# Patient Record
Sex: Male | Born: 1940
Health system: Southern US, Community
[De-identification: ages and names within clinical notes are randomized; demographics above are authoritative.]

## PROBLEM LIST (undated history)

## (undated) DIAGNOSIS — I509 Heart failure, unspecified: Secondary | ICD-10-CM

## (undated) DIAGNOSIS — N400 Enlarged prostate without lower urinary tract symptoms: Secondary | ICD-10-CM

## (undated) DIAGNOSIS — G459 Transient cerebral ischemic attack, unspecified: Secondary | ICD-10-CM

## (undated) DIAGNOSIS — E785 Hyperlipidemia, unspecified: Secondary | ICD-10-CM

## (undated) DIAGNOSIS — I471 Supraventricular tachycardia: Secondary | ICD-10-CM

## (undated) DIAGNOSIS — N183 Chronic kidney disease, stage 3 unspecified: Secondary | ICD-10-CM

## (undated) DIAGNOSIS — I4719 Other supraventricular tachycardia: Secondary | ICD-10-CM

## (undated) DIAGNOSIS — I422 Other hypertrophic cardiomyopathy: Secondary | ICD-10-CM

## (undated) DIAGNOSIS — I4892 Unspecified atrial flutter: Secondary | ICD-10-CM

## (undated) DIAGNOSIS — C44319 Basal cell carcinoma of skin of other parts of face: Secondary | ICD-10-CM

## (undated) DIAGNOSIS — K635 Polyp of colon: Secondary | ICD-10-CM

## (undated) DIAGNOSIS — I2510 Atherosclerotic heart disease of native coronary artery without angina pectoris: Secondary | ICD-10-CM

## (undated) DIAGNOSIS — I10 Essential (primary) hypertension: Secondary | ICD-10-CM

## (undated) DIAGNOSIS — K5790 Diverticulosis of intestine, part unspecified, without perforation or abscess without bleeding: Secondary | ICD-10-CM

## (undated) HISTORY — PX: HERNIA REPAIR: SHX51

## (undated) HISTORY — DX: Chronic kidney disease, stage 3 (moderate): N18.3

## (undated) HISTORY — DX: Diverticulosis of intestine, part unspecified, without perforation or abscess without bleeding: K57.90

## (undated) HISTORY — DX: Chronic kidney disease, stage 3 unspecified: N18.30

## (undated) HISTORY — DX: Atherosclerotic heart disease of native coronary artery without angina pectoris: I25.10

## (undated) HISTORY — DX: Other hypertrophic cardiomyopathy: I42.2

## (undated) HISTORY — DX: Hyperlipidemia, unspecified: E78.5

## (undated) HISTORY — PX: JOINT REPLACEMENT: SHX530

## (undated) HISTORY — DX: Essential (primary) hypertension: I10

## (undated) HISTORY — DX: Polyp of colon: K63.5

## (undated) HISTORY — PX: BASAL CELL CARCINOMA EXCISION: SHX1214

## (undated) HISTORY — DX: Benign prostatic hyperplasia without lower urinary tract symptoms: N40.0

## (undated) HISTORY — DX: Heart failure, unspecified: I50.9

## (undated) HISTORY — PX: TOTAL HIP ARTHROPLASTY: SHX124

## (undated) HISTORY — DX: Transient cerebral ischemic attack, unspecified: G45.9

## (undated) HISTORY — DX: Unspecified atrial flutter: I48.92

---
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
## 2000-10-07 ENCOUNTER — Ambulatory Visit (HOSPITAL_COMMUNITY): Admission: RE | Admit: 2000-10-07 | Discharge: 2000-10-07 | Payer: Self-pay | Admitting: Gastroenterology

## 2000-10-07 ENCOUNTER — Encounter (INDEPENDENT_AMBULATORY_CARE_PROVIDER_SITE_OTHER): Payer: Self-pay | Admitting: Specialist

## 2004-10-18 ENCOUNTER — Ambulatory Visit: Payer: Self-pay | Admitting: Cardiology

## 2004-11-05 ENCOUNTER — Ambulatory Visit: Payer: Self-pay | Admitting: Cardiology

## 2004-11-05 ENCOUNTER — Ambulatory Visit: Payer: Self-pay

## 2005-09-24 ENCOUNTER — Ambulatory Visit: Payer: Self-pay | Admitting: Internal Medicine

## 2005-10-02 ENCOUNTER — Inpatient Hospital Stay (HOSPITAL_COMMUNITY): Admission: RE | Admit: 2005-10-02 | Discharge: 2005-10-05 | Payer: Self-pay | Admitting: Orthopaedic Surgery

## 2007-01-22 ENCOUNTER — Ambulatory Visit: Payer: Self-pay | Admitting: Cardiology

## 2007-01-27 ENCOUNTER — Ambulatory Visit: Payer: Self-pay | Admitting: Cardiology

## 2007-01-27 ENCOUNTER — Ambulatory Visit: Payer: Self-pay

## 2007-01-27 ENCOUNTER — Encounter: Payer: Self-pay | Admitting: Cardiology

## 2007-01-27 LAB — CONVERTED CEMR LAB
ALT: 21 units/L (ref 0–53)
AST: 25 units/L (ref 0–37)
Albumin: 3.3 g/dL — ABNORMAL LOW (ref 3.5–5.2)
Alkaline Phosphatase: 22 units/L — ABNORMAL LOW (ref 39–117)
Bilirubin, Direct: 0.1 mg/dL (ref 0.0–0.3)
Cholesterol: 125 mg/dL (ref 0–200)
HDL: 50 mg/dL (ref >39.0)
LDL Cholesterol: 63 mg/dL (ref 0–99)
Total Bilirubin: 0.7 mg/dL (ref 0.3–1.2)
Total CHOL/HDL Ratio: 2.5
Total Protein: 5.8 g/dL — ABNORMAL LOW (ref 6.0–8.3)
Triglycerides: 58 mg/dL (ref 0–149)
VLDL: 12 mg/dL (ref 0–40)

## 2007-06-01 ENCOUNTER — Inpatient Hospital Stay (HOSPITAL_COMMUNITY): Admission: RE | Admit: 2007-06-01 | Discharge: 2007-06-04 | Payer: Self-pay | Admitting: Orthopaedic Surgery

## 2008-03-29 ENCOUNTER — Ambulatory Visit: Payer: Self-pay | Admitting: Cardiology

## 2008-09-19 ENCOUNTER — Encounter: Payer: Self-pay | Admitting: Cardiology

## 2008-11-27 ENCOUNTER — Telehealth: Payer: Self-pay | Admitting: Cardiology

## 2009-01-15 ENCOUNTER — Observation Stay (HOSPITAL_COMMUNITY): Admission: EM | Admit: 2009-01-15 | Discharge: 2009-01-16 | Payer: Self-pay | Admitting: Emergency Medicine

## 2009-01-16 ENCOUNTER — Ambulatory Visit: Payer: Self-pay | Admitting: Vascular Surgery

## 2009-01-16 ENCOUNTER — Encounter (INDEPENDENT_AMBULATORY_CARE_PROVIDER_SITE_OTHER): Payer: Self-pay | Admitting: Internal Medicine

## 2009-01-17 ENCOUNTER — Telehealth: Payer: Self-pay | Admitting: Cardiology

## 2009-03-01 DIAGNOSIS — E782 Mixed hyperlipidemia: Secondary | ICD-10-CM | POA: Insufficient documentation

## 2009-03-01 DIAGNOSIS — E785 Hyperlipidemia, unspecified: Secondary | ICD-10-CM | POA: Insufficient documentation

## 2009-03-05 ENCOUNTER — Ambulatory Visit: Payer: Self-pay | Admitting: Cardiology

## 2009-03-05 DIAGNOSIS — I10 Essential (primary) hypertension: Secondary | ICD-10-CM | POA: Insufficient documentation

## 2009-03-19 ENCOUNTER — Ambulatory Visit: Payer: Self-pay | Admitting: Cardiology

## 2009-03-19 LAB — CONVERTED CEMR LAB
BUN: 14 mg/dL (ref 6–23)
CO2: 28 meq/L (ref 19–32)
Calcium: 9.4 mg/dL (ref 8.4–10.5)
Chloride: 107 meq/L (ref 96–112)
Creatinine, Ser: 1 mg/dL (ref 0.4–1.5)
GFR calc non Af Amer: 78.76 mL/min (ref >60)
Glucose, Bld: 95 mg/dL (ref 70–99)
Potassium: 4.3 meq/L (ref 3.5–5.1)
Sodium: 141 meq/L (ref 135–145)

## 2009-03-21 ENCOUNTER — Encounter: Payer: Self-pay | Admitting: Cardiology

## 2009-03-26 ENCOUNTER — Encounter: Payer: Self-pay | Admitting: Cardiology

## 2009-04-06 ENCOUNTER — Ambulatory Visit: Payer: Self-pay | Admitting: Cardiology

## 2009-04-30 ENCOUNTER — Inpatient Hospital Stay (HOSPITAL_COMMUNITY): Admission: EM | Admit: 2009-04-30 | Discharge: 2009-05-01 | Payer: Self-pay | Admitting: Emergency Medicine

## 2009-05-03 ENCOUNTER — Telehealth: Payer: Self-pay | Admitting: Cardiology

## 2009-05-09 ENCOUNTER — Ambulatory Visit: Payer: Self-pay | Admitting: Cardiology

## 2009-05-09 ENCOUNTER — Ambulatory Visit: Payer: Self-pay

## 2009-05-18 ENCOUNTER — Encounter: Payer: Self-pay | Admitting: Cardiology

## 2009-07-16 ENCOUNTER — Ambulatory Visit: Payer: Self-pay | Admitting: Cardiology

## 2009-07-16 ENCOUNTER — Encounter: Payer: Self-pay | Admitting: Cardiology

## 2009-07-16 ENCOUNTER — Ambulatory Visit (HOSPITAL_COMMUNITY): Admission: RE | Admit: 2009-07-16 | Discharge: 2009-07-16 | Payer: Self-pay | Admitting: Cardiology

## 2009-07-16 ENCOUNTER — Ambulatory Visit: Payer: Self-pay

## 2009-07-17 ENCOUNTER — Telehealth: Payer: Self-pay | Admitting: Cardiology

## 2010-04-16 NOTE — Medication Information (Signed)
Summary: Drug Quantity Limitation Request Form  Drug Quantity Limitation Request Form   Imported By: Roderic Ovens 04/17/2009 13:03:34  _____________________________________________________________________  External Attachment:    Type:   Image     Comment:   External Document

## 2010-04-16 NOTE — Progress Notes (Signed)
Summary: returning call   Phone Note Call from Patient Call back at [phone]   Caller: Patient Reason for Call: Talk to Nurse Summary of Call: returning call Initial call taken by: Migdalia Dk,  Jul 17, 2009 10:59 AM  Follow-up for Phone Call        Phone Call Completed PT AWARE OF ECHO RESULTS. Follow-up by: Scherrie Bateman, LPN,  Jul 18, 5954 11:07 AM

## 2010-04-16 NOTE — Assessment & Plan Note (Signed)
Summary: PER CHECK OUT/SF   Visit Type:  4 mo f/u  CC:  no cardiac complaints today..pt lost 6 lb since 03/2009..pt states "his winter coat coming off".  Current Medications (verified): 1)  Vytorin 10-40 Mg Tabs (Ezetimibe-Simvastatin) .Marland Kitchen.. 1 Tab Once Daily 2)  Avodart 0.5 Mg Caps (Dutasteride) .Marland Kitchen.. 1 Cap Once Daily 3)  Lisinopril 10 Mg Tabs (Lisinopril) .Marland Kitchen.. 1 Tab Once Daily 4)  Aspirin 81 Mg Tbec (Aspirin) .... Take One Tablet By Mouth Daily 5)  Tylenol Pm Extra Strength 500-25 Mg Tabs (Diphenhydramine-Apap (Sleep)) .Marland Kitchen.. 1 Tab At Bedtime  Allergies: 1)  ! Pcn  Vital Signs:  Patient profile:   70 year old male Height:      70 inches Weight:      177 pounds BMI:     25.49 Pulse rate:   66 / minute Pulse rhythm:   regular BP sitting:   122 / 80  (right arm) Cuff size:   large  Vitals Entered By: Danielle Rankin, CMA (Jul 16, 2009 10:43 AM)   Impression & Recommendations:  Problem # 1:  HYPERTROPHIC CARDIOMYOPATHY/ NONOBSTRUCTIVE (ICD-425.1)  The following medications were removed from the medication list:    Plavix 75 Mg Tabs (Clopidogrel bisulfate) .Marland Kitchen... 1 tab once daily His updated medication list for this problem includes:    Lisinopril 10 Mg Tabs (Lisinopril) .Marland Kitchen... 1 tab once daily    Aspirin 81 Mg Tbec (Aspirin) .Marland Kitchen... Take one tablet by mouth daily  The following medications were removed from the medication list:    Plavix 75 Mg Tabs (Clopidogrel bisulfate) .Marland Kitchen... 1 tab once daily His updated medication list for this problem includes:    Lisinopril 20 Mg Tabs (Lisinopril) .Marland Kitchen... 1 once daily    Aspirin 81 Mg Tbec (Aspirin) .Marland Kitchen... Take one tablet by mouth daily  Problem # 2:  HYPERTENSION, UNSPECIFIED (ICD-401.9)  His updated medication list for this problem includes:    Lisinopril 10 Mg Tabs (Lisinopril) .Marland Kitchen... 1 tab once daily    Aspirin 81 Mg Tbec (Aspirin) .Marland Kitchen... Take one tablet by mouth daily  His updated medication list for this problem includes:    Lisinopril 20  Mg Tabs (Lisinopril) .Marland Kitchen... 1 once daily    Aspirin 81 Mg Tbec (Aspirin) .Marland Kitchen... Take one tablet by mouth daily  Problem # 3:  CVA/ RIGHT MCA TERRITORY (ICD-434.91) Assessment: Unchanged  The following medications were removed from the medication list:    Plavix 75 Mg Tabs (Clopidogrel bisulfate) .Marland Kitchen... 1 tab once daily His updated medication list for this problem includes:    Aspirin 81 Mg Tbec (Aspirin) .Marland Kitchen... Take one tablet by mouth daily  The following medications were removed from the medication list:    Plavix 75 Mg Tabs (Clopidogrel bisulfate) .Marland Kitchen... 1 tab once daily His updated medication list for this problem includes:    Aspirin 81 Mg Tbec (Aspirin) .Marland Kitchen... Take one tablet by mouth daily  Problem # 4:  DYSLIPIDEMIA (ICD-272.4) Assessment: Unchanged  His updated medication list for this problem includes:    Vytorin 10-40 Mg Tabs (Ezetimibe-simvastatin) .Marland Kitchen... 1 tab once daily  His updated medication list for this problem includes:    Vytorin 10-40 Mg Tabs (Ezetimibe-simvastatin) .Marland Kitchen... 1 tab once daily  Patient Instructions: 1)  Your physician recommends that you schedule a follow-up appointment in: YEAR WITH DR Jamiel Goncalves 2)  Your physician recommends that you continue on your current medications as directed. Please refer to the Current Medication list given to you today. Prescriptions: LISINOPRIL  10 MG TABS (LISINOPRIL) 1 tab once daily  #90 x 3   Entered by:   Danielle Rankin, CMA   Authorized by:   Gaylord Shih, MD, [facility]   Signed by:   Danielle Rankin, CMA on 07/16/2009   Method used:   Electronically to        [facility]* (mail-order)             ,          Ph: [phone]       Fax: [phone]   RxID:   [id number]

## 2010-04-16 NOTE — Letter (Signed)
Summary: Control and instrumentation engineer Authorization   Imported By: Roderic Ovens 06/13/2009 13:09:57  _____________________________________________________________________  External Attachment:    Type:   Image     Comment:   External Document

## 2010-04-16 NOTE — Miscellaneous (Signed)
  Clinical Lists Changes  Medications: Rx of LISINOPRIL 20 MG TABS (LISINOPRIL) 1 once daily;  #90 x 3;  Signed;  Entered by: Scherrie Bateman, LPN;  Authorized by: Gaylord Shih, MD, [facility];  Method used: Faxed to [facility]*, , ,   , Ph: [phone], Fax: [phone]    Prescriptions: LISINOPRIL 20 MG TABS (LISINOPRIL) 1 once daily  #90 x 3   Entered by:   Scherrie Bateman, LPN   Authorized by:   Gaylord Shih, MD, [facility]   Signed by:   Scherrie Bateman, LPN on 29/56/2130   Method used:   Faxed to ...       [facility]* (mail-order)             ,          Ph: [phone]       Fax: [phone]   RxID:   [id number]

## 2010-04-16 NOTE — Progress Notes (Signed)
Summary: rx vytorin 10/40 [facility]  Medications Added VYTORIN 10-40 MG TABS (EZETIMIBE-SIMVASTATIN) 1 tab once daily AVODART 0.5 MG CAPS (DUTASTERIDE) 1 cap once daily ASPIRIN 81 MG TBEC (ASPIRIN) Take one tablet by mouth daily GLUCOSAMINE 1500 COMPLEX  CAPS (GLUCOSAMINE-CHONDROIT-VIT C-MN) 1 cap once daily       Phone Note Refill Request Call back at Home Phone [phone] Message from:  Patient on November 27, 2008 1:34 PM  Refills Requested: Medication #1:  VYTORIN 10-40 MG TABS 1 tab once daily.   Supply Requested: 3 months [facility] [id number], call pt once it has been done   Method Requested: Fax to Mail Away Pharmacy Initial call taken by: Migdalia Dk,  November 27, 2008 1:35 PM    New/Updated Medications: VYTORIN 10-40 MG TABS (EZETIMIBE-SIMVASTATIN) 1 tab once daily AVODART 0.5 MG CAPS (DUTASTERIDE) 1 cap once daily ASPIRIN 81 MG TBEC (ASPIRIN) Take one tablet by mouth daily GLUCOSAMINE 1500 COMPLEX  CAPS (GLUCOSAMINE-CHONDROIT-VIT C-MN) 1 cap once daily Prescriptions: VYTORIN 10-40 MG TABS (EZETIMIBE-SIMVASTATIN) 1 tab once daily  #90 x 3   Entered by:   Danielle Rankin, CMA   Authorized by:   Gaylord Shih, MD, [facility]   Signed by:   Danielle Rankin, CMA on 11/27/2008   Method used:   Electronically to        [facility]* (mail-order)             ,          Ph: [phone]       Fax: [phone]   RxID:   [id number]

## 2010-04-16 NOTE — Assessment & Plan Note (Signed)
Summary: bp check and labs  Nurse Visit   Vital Signs:  Patient profile:   70 year old male Pulse (ortho):   60 / minute Resp:     18 per minute BP supine:   110 / 70  (right arm)  Allergies: 1)  ! Pcn

## 2010-04-16 NOTE — Progress Notes (Signed)
Summary: b/p issue on monday- pt on linisopril   Phone Note Call from Patient Call back at [facility] Phone [phone] Call back at [phone]   Caller: Patient Reason for Call: Talk to Nurse Details for Reason: Per pt calling regarding to dosage on linisporil. does pt have proper dosage with low blood pressure incident on monday while he was on the meds. inform pt that i would send a message to his nurse.  Initial call taken by: Lorne Skeens,  May 03, 2009 9:49 AM  Follow-up for Phone Call        [facility] Scherrie Bateman, LPN  May 03, 2009 10:31 AM  Additional Follow-up for Phone Call Additional follow up Details #1::        FYI PT WAS HOSPITALIZED ON MON AND KEPT OVER NIGHT AFTER BLACKING OUT  FOLLOWING A 17  MILE BIKE RIDE.PER PT WAS GIVEN APPROX 5 L OF FLUID  LISINOPRIL WAS HELD X 2 DAYS ON THIRD DAY PT ONLY TOOK 1/2 TAB LISINOPRIL 20 MG B/P NEXT AM WAS 120/68 B/P  WHILE MED WAS HELD RAN 110/65-70 PER PT. PLEASE ADVISE. Additional Follow-up by: Scherrie Bateman, LPN,  May 03, 2009 10:58 AM    Additional Follow-up for Phone Call Additional follow up Details #2::    He neds to stay on lisinopril. He must hydrate well before exercising. Need to arrange standard treadmill with me next week. Just double book. Have him take meds prior to treadmill. Follow-up by: Gaylord Shih, MD, [facility],  May 03, 2009 2:11 PM   Appended Document: b/p issue on monday- pt on linisopril PT AWARE./CY  Appended Document: b/p issue on monday- pt on linisopril PT AWARE./CY

## 2010-04-16 NOTE — Miscellaneous (Signed)
  Clinical Lists Changes  Medications: Removed medication of HYDROCHLOROTHIAZIDE 12.5 MG CAPS (HYDROCHLOROTHIAZIDE) 1 cap once daily Added new medication of LISINOPRIL 20 MG TABS (LISINOPRIL) 1 once daily - Signed Rx of LISINOPRIL 20 MG TABS (LISINOPRIL) 1 once daily;  #30 x 3;  Signed;  Entered by: Scherrie Bateman, LPN;  Authorized by: Gaylord Shih, MD, [facility];  Method used: [facility] to [id number] [address] [place], [place], [place]  [address], Ph: [phone], Fax: [phone]    Prescriptions: LISINOPRIL 20 MG TABS (LISINOPRIL) 1 once daily  #30 x 3   Entered by:   Scherrie Bateman, LPN   Authorized by:   Gaylord Shih, MD, [facility]   Signed by:   Scherrie Bateman, LPN on 65/78/4696   Method used:   [facility]        [id number] ([facility]ail)       [address]       [place], [place]  [address]       Ph: [phone]       Fax: [phone]   RxID:   [id number]   Appended Document:   Reviewed Juanito Doom, MD

## 2010-04-16 NOTE — Assessment & Plan Note (Signed)
Summary: 4wk f/u sl    Primary Provider:  Lynnea [name]   History of Present Illness: [name] returns today for evaluation of his hypertension. Since we added lisinopril, his blood pressures at that goal. He brings a log today.  He had one episode of presyncope when his pressure was 90 systolic. This is after a 20 mile bike ride, 2 episodes of diarrhea, and sitting in a hot tub.  He said for the symptoms of TIAs or mini strokes. He denies orthopnea, PND or peripheral edema. He is more fatigued. Blood work is being followed by his primary care.  Current Medications (verified): 1)  Vytorin 10-40 Mg Tabs (Ezetimibe-Simvastatin) .Marland Kitchen.. 1 Tab Once Daily 2)  Avodart 0.5 Mg Caps (Dutasteride) .Marland Kitchen.. 1 Cap Once Daily 3)  Plavix 75 Mg Tabs (Clopidogrel Bisulfate) .Marland Kitchen.. 1 Tab Once Daily 4)  Lisinopril 20 Mg Tabs (Lisinopril) .Marland Kitchen.. 1 Once Daily  Allergies: 1)  ! Pcn  Past History:  Past Medical History: Last updated: 03/01/2009 CVA/ RIGHT MCA TERRITORY (ICD-434.91) HYPERTROPHIC CARDIOMYOPATHY/ NONOBSTRUCTIVE (ICD-425.1) DYSLIPIDEMIA (ICD-272.4)  Past Surgical History: Last updated: 03/01/2009  1. Remote history of excision of basal cell carcinoma.   2. Prior right total hip arthroplasty.   3. Left total hip replacement.      Family History: Last updated: 03/01/2009 The patient denies any history of strokes, TIAs,   diabetes, hypertension or cardiovascular disease in the family.      Social History: Last updated: 03/01/2009  Former tobacco user and smoked up until the age of 61,   two-packs per day for about 10 years and quit 38 years ago.  No alcohol,  quit 16 years ago.  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   Vital Signs:  Patient profile:   70 year old male Height:      70 inches Weight:      183 pounds BMI:     26.35 Pulse rate:   64 / minute Pulse rhythm:   regular BP sitting:   108 / 74  (left arm) Cuff size:   large  Physical  Exam  General:  Well developed, well nourished, in no acute distress. Head:  normocephalic and atraumatic Eyes:  PERRLA/EOM intact; conjunctiva and lids normal. Neck:  Neck supple, no JVD. No masses, thyromegaly or abnormal cervical nodes. Chest Strother Everitt:  no deformities or breast masses noted Lungs:  Clear bilaterally to auscultation and percussion. Heart:  regular rate and rhythm, PMI nondisplaced, no gallop or murmur. Msk:  Back normal, normal gait. Muscle strength and tone normal. Pulses:  pulses normal in all 4 extremities Extremities:  No clubbing or cyanosis. Neurologic:  Alert and oriented x 3.   Impression & Recommendations:  Problem # 1:  HYPERTENSION, UNSPECIFIED (ICD-401.9) Assessment Improved I am pleased with his blood pressures. Abdomen no changes. His electrolytes are stable early January. We will not repeat. I will have him return in May for a echocardiogram and office visit. Hopefully his left ventricular function will improve further. I discussed at length exercise parameters. His updated medication list for this problem includes:    Lisinopril 20 Mg Tabs (Lisinopril) .Marland Kitchen... 1 once daily  Other Orders: Echocardiogram (Echo)  Patient Instructions: 1)  Your physician recommends that you schedule a follow-up appointment in:  MAY 2011 ECHO SAME DAY 2)  Your physician recommends that you continue on your current medications as directed. Please refer to the Current Medication list given to you today.  3)  Your physician has requested that you have an echocardiogram.  Echocardiography is a painless test that uses sound waves to create images of your heart. It provides your doctor with information about the size and shape of your heart and how well your heart's chambers and valves are working.  This procedure takes approximately one hour. There are no restrictions for this procedure.SEE DR Kensley Valladares  SAME DAY

## 2010-04-16 NOTE — Progress Notes (Signed)
Summary: hospital fu appt   Phone Note Call from Patient Call back at Home Phone [phone]   Caller: Patient Reason for Call: Talk to Nurse Summary of Call: pt is sch for hospital fu on 11/24, pt wants to make sure that is not to long of a wait Initial call taken by: Migdalia Dk,  January 17, 2009 11:07 AM  Follow-up for Phone Call        End of Nov will be fine. Follow-up by: Gaylord Shih, MD,  [facility],  January 17, 2009 2:57 PM     Appended Document: hospital fu appt lmtcb./cy  Appended Document: hospital fu appt returning call  Appended Document: hospital fu appt PT AWARE .Zack Seal

## 2010-04-16 NOTE — Miscellaneous (Signed)
Summary: rx vytorin 10/40  Clinical Lists Changes  Medications: Added new medication of VYTORIN 10-40 MG TABS (EZETIMIBE-SIMVASTATIN) 1 tab once daily - Signed Rx of VYTORIN 10-40 MG TABS (EZETIMIBE-SIMVASTATIN) 1 tab once daily;  #30 x 11;  Signed;  Entered by: Danielle Rankin, CMA;  Authorized by: Gaylord Shih, MD, [facility];  Method used: [id number] [address] [place], [place], [place]  [address], Ph: [phone], Fax: [phone]    Prescriptions: VYTORIN 10-40 MG TABS (EZETIMIBE-SIMVASTATIN) 1 tab once daily  #30 x 11   Entered by:   Danielle Rankin, CMA   Authorized by:   Gaylord Shih, MD, [facility]   Signed by:   Danielle Rankin, CMA on 09/19/2008   Method used:   [facility] to        [facility]  [address] [phone][facility] (retail)       [address]       [place], [place]  [address]       Ph: [phone]       Fax: [phone]   RxID:   [id number]

## 2010-04-16 NOTE — Assessment & Plan Note (Signed)
Summary: eph/tia/ok per Christine/jss  Medications Added PLAVIX 75 MG TABS (CLOPIDOGREL BISULFATE) 1 tab once daily HYDROCHLOROTHIAZIDE 12.5 MG CAPS (HYDROCHLOROTHIAZIDE) 1 cap once daily        Visit Type:  EPH Primary Provider:  Lynnea Ferrier  CC:  pt was in the hospital for possible TIA.Marland Kitchenpt states he has had no complaints since he was in the hospital.  History of Present Illness: [name] comes in today for post hospital followup. He was admitted with a TIA involving the left side of his face with slurring of his speech while bike riding. Hospital evaluation included an MRI which showed a punctate infarct in the right lateral postcentral gyrus suggesting a recent right middle cerebral artery territory event. He also carotid Dopplers which showed minimal plaquing. 2-D echocardiogram showed mild left ventricular dilatation with an EF of 40-45% with moderate diffuse hypokinesia. This ejection fraction is down from his last ultrasound 2 years ago. He has a history of a hypertrophic nonobstructive cardiomyopathy. Previous ejection fraction was greater than 55%.  His blood pressure remains in the high 130s with diastolics running less than 80. He doesn't think the hydrochlorothiazide that he was started on has helped.  He is now on Plavix and low-dose aspirin.  Current Medications (verified): 1)  Vytorin 10-40 Mg Tabs (Ezetimibe-Simvastatin) .Marland Kitchen.. 1 Tab Once Daily 2)  Avodart 0.5 Mg Caps (Dutasteride) .Marland Kitchen.. 1 Cap Once Daily 3)  Plavix 75 Mg Tabs (Clopidogrel Bisulfate) .Marland Kitchen.. 1 Tab Once Daily 4)  Hydrochlorothiazide 12.5 Mg Caps (Hydrochlorothiazide) .Marland Kitchen.. 1 Cap Once Daily  Allergies: 1)  ! Pcn  Past History:  Past Medical History: Last updated: 03/01/2009 CVA/ RIGHT MCA TERRITORY (ICD-434.91) HYPERTROPHIC CARDIOMYOPATHY/ NONOBSTRUCTIVE (ICD-425.1) DYSLIPIDEMIA (ICD-272.4)  Past Surgical History: Last updated: 03/01/2009  1. Remote history of excision of basal cell carcinoma.   2.  Prior right total hip arthroplasty.   3. Left total hip replacement.      Family History: Last updated: 03/01/2009 The patient denies any history of strokes, TIAs,   diabetes, hypertension or cardiovascular disease in the family.      Social History: Last updated: 03/01/2009  Former tobacco user and smoked up until the age of 25,   two-packs per day for about 10 years and quit 38 years ago.  No alcohol,  quit 16 years ago.  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   Review of Systems       negative other than the history of present illness.  Vital Signs:  Patient profile:   70 year old male Height:      70 inches Weight:      183 pounds BMI:     26.35 Pulse rate:   68 / minute Pulse rhythm:   irregular BP sitting:   146 / 90  (left arm) Cuff size:   large  Vitals Entered By: Danielle Rankin, CMA (March 05, 2009 9:41 AM)  Physical Exam  General:  Well developed, well nourished, in no acute distress. Head:  normocephalic and atraumatic Eyes:  PERRLA/EOM intact; conjunctiva and lids normal. Neck:  Neck supple, no JVD. No masses, thyromegaly or abnormal cervical nodes. Chest [name]:  no deformities or breast masses noted Lungs:  Clear bilaterally to auscultation and percussion. Heart:  soft systolic murmur left lower sternal border. Abdomen:  Bowel sounds positive; abdomen soft and non-tender without masses, organomegaly, or hernias noted. No hepatosplenomegaly. Msk:  Back normal, normal gait. Muscle strength and tone  normal. Pulses:  pulses normal in all 4 extremities Extremities:  No clubbing or cyanosis. Neurologic:  Alert and oriented x 3. Skin:  Intact without lesions or rashes. Psych:  Normal affect.   Problems:  Medical Problems Added: 1)  Dx of Hypertension, Unspecified  (ICD-401.9)  Impression & Recommendations:  Problem # 1:  HYPERTENSION, UNSPECIFIED (ICD-401.9) Assessment New  The following medications were removed from  the medication list:    Aspirin 81 Mg Tbec (Aspirin) .Marland Kitchen... Take one tablet by mouth daily His updated medication list for this problem includes:    Hydrochlorothiazide 12.5 Mg Caps (Hydrochlorothiazide) .Marland Kitchen... 1 cap once daily Ihave discontinued his HCTZ and placed him on lisinopril 20 mg p.o. q.a.m. His ejection fraction is down on his last echocardiogram to 40-45%. Most of this is due to poorly controlled and new onset hypertension. The reason I am stopping his HCTZ because it has not changed his blood pressure at all per his checks. He will return after the new year for a metabolic panel and a blood pressure check. He'll continue to monitor his pressure and keep a log. I'll see him back for a followup visit in about 4 weeks. After good blood pressure control for 6 months, we will repeat his echocardiogram. The following medications were removed from the medication list:    Aspirin 81 Mg Tbec (Aspirin) .Marland Kitchen... Take one tablet by mouth daily His updated medication list for this problem includes:    Hydrochlorothiazide 12.5 Mg Caps (Hydrochlorothiazide) .Marland Kitchen... 1 cap once daily  Problem # 2:  HYPERTROPHIC CARDIOMYOPATHY/ NONOBSTRUCTIVE (ICD-425.1) Assessment: Deteriorated  The following medications were removed from the medication list:    Aspirin 81 Mg Tbec (Aspirin) .Marland Kitchen... Take one tablet by mouth daily His updated medication list for this problem includes:    Plavix 75 Mg Tabs (Clopidogrel bisulfate) .Marland Kitchen... 1 tab once daily    Hydrochlorothiazide 12.5 Mg Caps (Hydrochlorothiazide) .Marland Kitchen... 1 cap once daily See my note above. He may ultimately need a beta blocker such as carvedilol. The following medications were removed from the medication list:    Aspirin 81 Mg Tbec (Aspirin) .Marland Kitchen... Take one tablet by mouth daily His updated medication list for this problem includes:    Plavix 75 Mg Tabs (Clopidogrel bisulfate) .Marland Kitchen... 1 tab once daily    Hydrochlorothiazide 12.5 Mg Caps (Hydrochlorothiazide) .Marland Kitchen... 1  cap once daily  Problem # 3:  DYSLIPIDEMIA (ICD-272.4) Assessment: Improved  His updated medication list for this problem includes:    Vytorin 10-40 Mg Tabs (Ezetimibe-simvastatin) .Marland Kitchen... 1 tab once daily  Problem # 4:  CVA/ RIGHT MCA TERRITORY (ICD-434.91) Assessment: Improved  The following medications were removed from the medication list:    Aspirin 81 Mg Tbec (Aspirin) .Marland Kitchen... Take one tablet by mouth daily His updated medication list for this problem includes:    Plavix 75 Mg Tabs (Clopidogrel bisulfate) .Marland Kitchen... 1 tab once daily  Other Orders: EKG w/ Interpretation (93000)  Patient Instructions: 1)  Your physician recommends that you schedule a follow-up appointment in: B/P CHECK  AFTER FIRST OF YEAR  AND 4 WEEKS WITH DR Lakecia Deschamps 2)  Your physician recommends that you return for lab work GE:XBMW SAME DAY AS B/P CHECK V58.69 3)  Your physician has recommended you make the following change in your medication: STOP HCTZ AND START LISINOPRIL 20 MG EVERY DAY

## 2010-04-16 NOTE — Letter (Signed)
Summary: [employer] Step [employer] Drug Request Form  [employer] Step [employer] Drug Request Form   Imported By: Roderic Ovens 06/13/2009 13:07:22  _____________________________________________________________________  External Attachment:    Type:   Image     Comment:   External Document

## 2010-06-05 LAB — CBC
HCT: 32.5 % — ABNORMAL LOW (ref 39.0–52.0)
HCT: 34.3 % — ABNORMAL LOW (ref 39.0–52.0)
HCT: 36.1 % — ABNORMAL LOW (ref 39.0–52.0)
Hemoglobin: 11.5 g/dL — ABNORMAL LOW (ref 13.0–17.0)
Hemoglobin: 11.8 g/dL — ABNORMAL LOW (ref 13.0–17.0)
Hemoglobin: 12.6 g/dL — ABNORMAL LOW (ref 13.0–17.0)
MCHC: 34.3 g/dL (ref 30.0–36.0)
MCHC: 34.8 g/dL (ref 30.0–36.0)
MCHC: 35.3 g/dL (ref 30.0–36.0)
MCV: 91.1 fL (ref 78.0–100.0)
MCV: 91.1 fL (ref 78.0–100.0)
MCV: 91.9 fL (ref 78.0–100.0)
Platelets: 152 10*3/uL (ref 150–400)
Platelets: 159 10*3/uL (ref 150–400)
Platelets: 163 10*3/uL (ref 150–400)
RBC: 3.58 MIL/uL — ABNORMAL LOW (ref 4.22–5.81)
RBC: 3.73 MIL/uL — ABNORMAL LOW (ref 4.22–5.81)
RBC: 3.96 MIL/uL — ABNORMAL LOW (ref 4.22–5.81)
RDW: 13.2 % (ref 11.5–15.5)
RDW: 13.2 % (ref 11.5–15.5)
RDW: 13.3 % (ref 11.5–15.5)
WBC: 11 10*3/uL — ABNORMAL HIGH (ref 4.0–10.5)
WBC: 12.4 10*3/uL — ABNORMAL HIGH (ref 4.0–10.5)
WBC: 13.4 10*3/uL — ABNORMAL HIGH (ref 4.0–10.5)

## 2010-06-05 LAB — URINALYSIS, ROUTINE W REFLEX MICROSCOPIC
Bilirubin Urine: NEGATIVE
Glucose, UA: NEGATIVE mg/dL
Hgb urine dipstick: NEGATIVE
Ketones, ur: 40 mg/dL — AB
Leukocytes, UA: NEGATIVE
Nitrite: NEGATIVE
Protein, ur: 30 mg/dL — AB
Specific Gravity, Urine: 1.02 (ref 1.005–1.030)
Urobilinogen, UA: 0.2 mg/dL (ref 0.0–1.0)
pH: 6 (ref 5.0–8.0)

## 2010-06-05 LAB — FOLATE: Folate: 10.8 ng/mL

## 2010-06-05 LAB — COMPREHENSIVE METABOLIC PANEL
ALT: 14 U/L (ref 0–53)
AST: 18 U/L (ref 0–37)
Albumin: 3.2 g/dL — ABNORMAL LOW (ref 3.5–5.2)
Alkaline Phosphatase: 20 U/L — ABNORMAL LOW (ref 39–117)
BUN: 20 mg/dL (ref 6–23)
CO2: 23 mEq/L (ref 19–32)
Calcium: 8.3 mg/dL — ABNORMAL LOW (ref 8.4–10.5)
Chloride: 111 mEq/L (ref 96–112)
Creatinine, Ser: 1.21 mg/dL (ref 0.4–1.5)
GFR calc Af Amer: >60 mL/min (ref >60)
GFR calc non Af Amer: 59 mL/min — ABNORMAL LOW (ref >60)
Glucose, Bld: 98 mg/dL (ref 70–99)
Potassium: 4 mEq/L (ref 3.5–5.1)
Sodium: 140 mEq/L (ref 135–145)
Total Bilirubin: 1.1 mg/dL (ref 0.3–1.2)
Total Protein: 5.2 g/dL — ABNORMAL LOW (ref 6.0–8.3)

## 2010-06-05 LAB — FERRITIN: Ferritin: 251 ng/mL (ref 22–322)

## 2010-06-05 LAB — DIFFERENTIAL
Basophils Absolute: 0 10*3/uL (ref 0.0–0.1)
Basophils Relative: 0 % (ref 0–1)
Eosinophils Absolute: 0 10*3/uL (ref 0.0–0.7)
Eosinophils Relative: 0 % (ref 0–5)
Lymphocytes Relative: 8 % — ABNORMAL LOW (ref 12–46)
Lymphs Abs: 1 10*3/uL (ref 0.7–4.0)
Monocytes Absolute: 0.5 10*3/uL (ref 0.1–1.0)
Monocytes Relative: 4 % (ref 3–12)
Neutro Abs: 10.9 10*3/uL — ABNORMAL HIGH (ref 1.7–7.7)
Neutrophils Relative %: 87 % — ABNORMAL HIGH (ref 43–77)

## 2010-06-05 LAB — URINE MICROSCOPIC-ADD ON

## 2010-06-05 LAB — CK TOTAL AND CKMB (NOT AT ARMC)
CK, MB: 6.3 ng/mL (ref 0.3–4.0)
Relative Index: 5.6 — ABNORMAL HIGH (ref 0.0–2.5)
Total CK: 113 U/L (ref 7–232)

## 2010-06-05 LAB — RETICULOCYTES
RBC.: 3.89 MIL/uL — ABNORMAL LOW (ref 4.22–5.81)
Retic Count, Absolute: 35 10*3/uL (ref 19.0–186.0)
Retic Ct Pct: 0.9 % (ref 0.4–3.1)

## 2010-06-05 LAB — POCT I-STAT, CHEM 8
BUN: 25 mg/dL — ABNORMAL HIGH (ref 6–23)
Calcium, Ion: 1.17 mmol/L (ref 1.12–1.32)
Chloride: 108 mEq/L (ref 96–112)
Creatinine, Ser: 1.5 mg/dL (ref 0.4–1.5)
Glucose, Bld: 96 mg/dL (ref 70–99)
HCT: 35 % — ABNORMAL LOW (ref 39.0–52.0)
Hemoglobin: 11.9 g/dL — ABNORMAL LOW (ref 13.0–17.0)
Potassium: 4.1 mEq/L (ref 3.5–5.1)
Sodium: 139 mEq/L (ref 135–145)
TCO2: 23 mmol/L (ref 0–100)

## 2010-06-05 LAB — POCT CARDIAC MARKERS
CKMB, poc: 2.2 ng/mL (ref 1.0–8.0)
Myoglobin, poc: 164 ng/mL (ref 12–200)
Troponin i, poc: <0.05 ng/mL (ref 0.00–0.09)

## 2010-06-05 LAB — IRON AND TIBC
Iron: 24 ug/dL — ABNORMAL LOW (ref 42–135)
Saturation Ratios: 9 % — ABNORMAL LOW (ref 20–55)
TIBC: 271 ug/dL (ref 215–435)
UIBC: 247 ug/dL

## 2010-06-05 LAB — CARDIAC PANEL(CRET KIN+CKTOT+MB+TROPI)
CK, MB: 5.1 ng/mL — ABNORMAL HIGH (ref 0.3–4.0)
CK, MB: 6 ng/mL — ABNORMAL HIGH (ref 0.3–4.0)
Relative Index: 4.3 — ABNORMAL HIGH (ref 0.0–2.5)
Relative Index: 5.7 — ABNORMAL HIGH (ref 0.0–2.5)
Total CK: 106 U/L (ref 7–232)
Total CK: 118 U/L (ref 7–232)
Troponin I: 0.04 ng/mL (ref 0.00–0.06)
Troponin I: 0.05 ng/mL (ref 0.00–0.06)

## 2010-06-05 LAB — VITAMIN B12: Vitamin B-12: 760 pg/mL (ref 211–911)

## 2010-06-05 LAB — TSH: TSH: 1.091 u[IU]/mL (ref 0.350–4.500)

## 2010-06-05 LAB — TROPONIN I: Troponin I: 0.06 ng/mL (ref 0.00–0.06)

## 2010-06-19 LAB — CBC
HCT: 47 % (ref 39.0–52.0)
Hemoglobin: 16 g/dL (ref 13.0–17.0)
MCHC: 34 g/dL (ref 30.0–36.0)
MCV: 91.4 fL (ref 78.0–100.0)
Platelets: 173 10*3/uL (ref 150–400)
RBC: 5.14 MIL/uL (ref 4.22–5.81)
RDW: 12.6 % (ref 11.5–15.5)
WBC: 9.6 10*3/uL (ref 4.0–10.5)

## 2010-06-19 LAB — LIPID PANEL
Cholesterol: 124 mg/dL (ref 0–200)
HDL: 57 mg/dL (ref >39)
LDL Cholesterol: 56 mg/dL (ref 0–99)
Total CHOL/HDL Ratio: 2.2 RATIO
Triglycerides: 56 mg/dL (ref <150)
VLDL: 11 mg/dL (ref 0–40)

## 2010-06-19 LAB — ETHANOL: Alcohol, Ethyl (B): <5 mg/dL (ref 0–10)

## 2010-06-19 LAB — COMPREHENSIVE METABOLIC PANEL
ALT: 26 U/L (ref 0–53)
AST: 27 U/L (ref 0–37)
Albumin: 4.6 g/dL (ref 3.5–5.2)
Alkaline Phosphatase: 27 U/L — ABNORMAL LOW (ref 39–117)
BUN: 18 mg/dL (ref 6–23)
CO2: 26 mEq/L (ref 19–32)
Calcium: 10.2 mg/dL (ref 8.4–10.5)
Chloride: 104 mEq/L (ref 96–112)
Creatinine, Ser: 1.19 mg/dL (ref 0.4–1.5)
GFR calc Af Amer: >60 mL/min (ref >60)
GFR calc non Af Amer: >60 mL/min (ref >60)
Glucose, Bld: 90 mg/dL (ref 70–99)
Potassium: 5.7 mEq/L — ABNORMAL HIGH (ref 3.5–5.1)
Sodium: 139 mEq/L (ref 135–145)
Total Bilirubin: 0.9 mg/dL (ref 0.3–1.2)
Total Protein: 7.2 g/dL (ref 6.0–8.3)

## 2010-06-19 LAB — DIFFERENTIAL
Basophils Absolute: 0 10*3/uL (ref 0.0–0.1)
Basophils Relative: 0 % (ref 0–1)
Eosinophils Absolute: 0 10*3/uL (ref 0.0–0.7)
Eosinophils Relative: 0 % (ref 0–5)
Lymphocytes Relative: 17 % (ref 12–46)
Lymphs Abs: 1.6 10*3/uL (ref 0.7–4.0)
Monocytes Absolute: 0.6 10*3/uL (ref 0.1–1.0)
Monocytes Relative: 7 % (ref 3–12)
Neutro Abs: 7.3 10*3/uL (ref 1.7–7.7)
Neutrophils Relative %: 76 % (ref 43–77)

## 2010-06-19 LAB — POCT CARDIAC MARKERS
CKMB, poc: 3.4 ng/mL (ref 1.0–8.0)
Myoglobin, poc: 122 ng/mL (ref 12–200)
Troponin i, poc: <0.05 ng/mL (ref 0.00–0.09)

## 2010-06-19 LAB — POCT I-STAT, CHEM 8
BUN: 25 mg/dL — ABNORMAL HIGH (ref 6–23)
Calcium, Ion: 1.26 mmol/L (ref 1.12–1.32)
Chloride: 105 mEq/L (ref 96–112)
Creatinine, Ser: 1.1 mg/dL (ref 0.4–1.5)
Glucose, Bld: 86 mg/dL (ref 70–99)
HCT: 49 % (ref 39.0–52.0)
Hemoglobin: 16.7 g/dL (ref 13.0–17.0)
Potassium: 5.7 mEq/L — ABNORMAL HIGH (ref 3.5–5.1)
Sodium: 138 mEq/L (ref 135–145)
TCO2: 27 mmol/L (ref 0–100)

## 2010-06-19 LAB — BASIC METABOLIC PANEL
BUN: 15 mg/dL (ref 6–23)
CO2: 28 mEq/L (ref 19–32)
Calcium: 9 mg/dL (ref 8.4–10.5)
Chloride: 108 mEq/L (ref 96–112)
Creatinine, Ser: 1.17 mg/dL (ref 0.4–1.5)
GFR calc Af Amer: >60 mL/min (ref >60)
GFR calc non Af Amer: >60 mL/min (ref >60)
Glucose, Bld: 91 mg/dL (ref 70–99)
Potassium: 4.3 mEq/L (ref 3.5–5.1)
Sodium: 142 mEq/L (ref 135–145)

## 2010-06-19 LAB — APTT: aPTT: 25 seconds (ref 24–37)

## 2010-06-19 LAB — RAPID URINE DRUG SCREEN, HOSP PERFORMED
Amphetamines: NOT DETECTED
Barbiturates: NOT DETECTED
Benzodiazepines: NOT DETECTED
Cocaine: NOT DETECTED
Opiates: NOT DETECTED
Tetrahydrocannabinol: NOT DETECTED

## 2010-06-19 LAB — PROTIME-INR
INR: 1.08 (ref 0.00–1.49)
Prothrombin Time: 13.9 seconds (ref 11.6–15.2)

## 2010-06-19 LAB — GLUCOSE, CAPILLARY: Glucose-Capillary: 80 mg/dL (ref 70–99)

## 2010-06-19 LAB — GLUCOSE, RANDOM: Glucose, Bld: 86 mg/dL (ref 70–99)

## 2010-07-29 ENCOUNTER — Other Ambulatory Visit: Payer: Self-pay | Admitting: Cardiology

## 2010-07-30 NOTE — Assessment & Plan Note (Signed)
[facility]                            CARDIOLOGY OFFICE NOTE   NAME:[name], [name]                      MRN:          [id number]  DATE:03/29/2008                            DOB:          06/17/1940    [name] comes in today for followup of the following issues:  1. Nonobstructive hypertrophic cardiomyopathy.  His septum is stable      at 14 mm.  Last echo was in November 2008.  He most likely has a      bicuspid aortic valve, which is nonstenotic.  2. Mild aortic regurgitation.  3. Hyperlipidemia, well controlled on Vytorin.  His numbers were at      goal as he shares in with me today.   He has now had 2 hip operations for severe osteoarthritis.  He is now  back on his road bike 70 miles a week!   He is currently on:  1. Vytorin 10/40 daily.  2. Avodart 0.5 mg daily.  3. Glucosamine 1500 mg a day.  4. Aspirin 81 mg a day.   He denies any angina, shortness of breath, dyspnea on exertion,  palpitations, syncope, or presyncope.   PHYSICAL EXAMINATION:  VITAL SIGNS:  His blood pressure today is 116/80,  his pulse is 62 and regular, his weight is 186 up 10.  HEENT:  Normal.  NECK:  Carotids are full without bruits.  There is no bisferiens pulse.  Thyroid is not enlarged.  Trachea is midline.  LUNGS:  Clear to auscultation and percussion.  HEART:  Nondisplaced PMI.  There is a very faint systolic murmur going  from lying to standing only.  There is no diastolic component  appreciated.  S2 splits physiologically.  ABDOMEN:  Soft, good bowel sounds.  No midline or pulsatile mass.  EXTREMITIES:  No cyanosis, clubbing, or edema.  Pulses are intact.  NEUROLOGIC:  Intact.   EKG demonstrates normal sinus rhythm with a first-degree AV block.  He  has left axis deviation comparing his old EKGs that is not changed.   I am delighted [name] is doing so well.  He obviously takes his  health very seriously hence doing well in general.  He will need  a  followup with me in November at which time, we will also do a 2-D  echocardiogram.  All lab work reviewed from the outside.     Thomas C. Daleen Squibb, MD, [facility]  Electronically Signed    TCW/MedQ  DD: 03/29/2008  DT: 03/30/2008  Job #: [id number]

## 2010-07-30 NOTE — Op Note (Signed)
[name], [name] NO.:  [id number]   MEDICAL RECORD NO.:  [id number]          PATIENT TYPE:  INP   LOCATION:  2899                         FACILITY:  [facility]   PHYSICIAN:  Claude Manges. Whitfield, M.D.DATE OF BIRTH:  May 19, 1940   DATE OF PROCEDURE:  06/01/2007  DATE OF DISCHARGE:                               OPERATIVE REPORT   PREOPERATIVE DIAGNOSIS:  Osteoarthritis right hip.   POSTOPERATIVE DIAGNOSIS:  Osteoarthritis right hip.   PROCEDURE:  Right total hip arthroplasty.   SURGEON:  Claude Manges. Cleophas Dunker, M.D.   ASSISTANT:  Dr. Chaney Malling and Oris Drone. Petrarca, P.A.-C.   ANESTHESIA:  General orotracheal.   COMPLICATIONS:  None.   COMPONENTS:  DePuy AML 15 mm large stature femoral component with the  Prodigy stem, a 36 mm diameter hip ball with a +5 mm neck length.  The  58 mm outer diameter 100 series metallic acetabular component with a +4  10 degrees posterior lip polyethylene liner.  Components were press fit.   PROCEDURE:  The patient comfortable on the operating table and under  general orotracheal anesthesia, nursing staff inserted a Foley catheter.  The patient was then placed in the lateral decubitus position with the  right side up.  The right side had been marked as the appropriate  operative extremity in the holding area.   The patient was then secured to the operating table with the Innomed hip  system.   The right lower extremity was then prepped from iliac crest to the ankle  with Betadine scrub and DuraPrep.  Sterile draping was performed.   A routine Southern incision was utilized and via sharp dissection  carried down to subcutaneous tissue.  The incision was carried down  through the subcutaneous tissue and adipose tissue along the length of  the skin incision.  I encountered a large bursal sac with probably 25 to  30 mL of clear of bursal fluid.  There were several soft tissue loose  bodies within the bursal sac.  I performed a  bursectomy.   The iliotibial band was then incised and muscle fibers were separated to  reveal the greater trochanter.  Self-retaining retractors were inserted.  With the hip internally rotated, short external rotators were  identified.  The tendinous structures were tagged and they were then  released from the posterior aspect of the greater trochanter with the  Bovie.  Capsule was identified, incised along length of the femoral  neck.  There was a very minimal clear yellow joint effusion and some  mild synovitis.  The head was easily dislocated posteriorly.  The head  revealed large areas of complete articular cartilage loss and peripheral  osteophyte formation.   Using the calcar guide, the femoral neck was osteotomized at about a  fingerbreadth proximal to the lesser trochanter.  Retractors were then  placed.  The head was then delivered from the wound.   Retractors were placed around the femoral neck.  Capsule was incised,  then the piriformis fossa.  Starter hole was then made, the canal finder  inserted.  Reaming was performed to 14.5 mm  to accept a 15-mm component  which we had determined preoperatively and which we thought was the  appropriate fit based on reaming.  Rasping was then performed  sequentially from 10.5 to 15 and then a 13.5 large and a 15 large.  It  fit nicely on the calcar without need for a calcar reamer.   Retractor were then placed about the acetabulum, labrum was sharply  excised.  Reaming was performed sequentially to 57 mm to accept a 58-mm  component.  This was also templated preoperatively and confirmed  intraoperatively.   We trialed a 56 and then a 58 component.  The 58 had nice rim fit and  would seat nicely.  The 58 would not seat completely and we therefore  felt this was the appropriate size.   The 100 series metallic acetabular component was then impacted into the  acetabulum.  It was loose.  We then removed it, removed further soft  tissue  from around the periphery and then reimpacted it with a perfect  stability.   The trial polyethylene component was then inserted followed by the 15 mm  large femoral stem, the regular neck and several size hip balls.  We  felt that the +5 gave [place] symmetrical leg lengths but there was some  impingement of the neck on the anterior acetabulum.  Therefore we  trialed the Prodigy stem and this provided perfect stability and was  just elevated enough that it would not impinge on the cup and therefore  we were perfectly stable.   The trial components were then removed.  The wounds were copiously  irrigated.  The apex hole eliminator was inserted in the acetabulum  followed by the final +4 posterior 10 degrees lip polyethylene liner.   Retractor was placed around the femoral neck.  We impacted the large  stature 15 mm femoral component with the Prodigy neck.  We then again  trialed the 36 mm outer diameter hip ball with a +5 neck, reduced it and  had perfect stability.   The Morse taper neck was then cleaned, reinserted and then applied the  final hip ball x 36 mm odd and with a diameter with a +5 neck length.  This was reduced through a full range of motion we had perfect  stability, we felt that the acetabulum was stable.   The wound was then irrigated with saline solution.  The capsule closed  with interrupted #1 Ethilon.  The short external rotators were  reapproximated anatomically with a similar material.   Iliotibial band was closed with a running #1 Vicryl, the subcu was  closed in several layers with 0-0 and 2-0 Vicryl, skin closed with skin  clips.  Sterile bulky dressing was applied.  The patient was then  awoken, placed supine on the operating room stretcher returned to the  post anesthesia recovery room in satisfactory condition.      Claude Manges. Cleophas Dunker, M.D.  Electronically Signed     PWW/MEDQ  D:  06/01/2007  T:  06/01/2007  Job:  [id number]

## 2010-07-30 NOTE — Assessment & Plan Note (Signed)
[place] [facility]                            CARDIOLOGY OFFICE NOTE   NAME:Siegel, GALE HULSE                      MRN:          [id number]  DATE:01/22/2007                            DOB:          1941/01/09    Mr. Vicente Masson returns today for evaluation of the following issues:  1. Nonobstructive hypertrophic cardiomyopathy.  The last 2D      echocardiogram was on November 05, 2004.  2. Mild aortic regurgitation.  3. Hyperlipidemia, well controlled with Vytorin.   He was last seen in our office on September 24, 2005 for clearance for left  hip surgery.  This was accomplished about a year ago by Norlene Campbell.  He thinks he is also going to have to do his right hip.   Jaran is riding bikes now.  He says he feels remarkably good.  He is  having some mild shortness of breath when he pushes his heart rate to  165, which is above his 100% maximum.  We talked about aerobic  thresholds today.  He has not had his lipids checked in a year.   CURRENT MEDICATIONS:  1. Vytorin 10/40 daily.  2. Avodart 0.5 mg.  3. Glucosamine 1500 mg a day.  4. Aspirin 81 mg a day.  5. Acetaminophen 500 mg p.o. b.i.d.   PHYSICAL EXAMINATION:  His blood pressure today is 119/72.  His pulse is  71 and regular.  His weight is 176.  HEENT:  Normocephalic and atraumatic.  PERRLA.  Extraocular movements  are intact.  Sclerae are clear.  Facial symmetry is normal.  NECK:  Supple.  Carotid upstrokes are equal bilaterally without bruits.  There is no JVD.  Thyroid is not enlarged.  Trachea is midline.  LUNGS:  Clear.  HEART:  Regular rate and rhythm.  He has somewhat of a dynamic PMI.  There is no significant murmur, that I can appreciate today.  ABDOMEN:  Soft with good bowel sounds.  EXTREMITIES:  No clubbing, cyanosis or edema.  Pulses are intact.  No  sign of DVT.  NEUROLOGIC:  Intact.   Electrocardiogram shows normal sinus rhythm with a first-degree AV  block.  T wave changes in I and  aVL and V5-6.  He has decreased R waves  in V4-6.  This is unchanged from his previous ECGs.   ASSESSMENT/PLAN:  Deforrest is doing very well.  I have advised him to do  the following:  1. Fasting lipids and LFTs.  2. Continue Vytorin.  We discussed the enhanced trial controversy.  3. A 2D echocardiogram to follow up on his hypertrophic      cardiomyopathy.  I will plan on seeing him back in a year.     Thomas C. Daleen Squibb, MD, [facility]  Electronically Signed    TCW/MedQ  DD: 01/22/2007  DT: 01/23/2007  Job #: [id number]   cc:   Olena Leatherwood [facility]

## 2010-08-02 NOTE — Discharge Summary (Addendum)
NAMEMARQUEST, [name]               ACCOUNT NO.:  [id number]   MEDICAL RECORD NO.:  [id number]          PATIENT TYPE:  INP   LOCATION:  5007                         FACILITY:  [facility]   PHYSICIAN:  Claude Manges. Whitfield, M.D.DATE OF BIRTH:  10-13-1940   DATE OF ADMISSION:  10/02/2005  DATE OF DISCHARGE:  10/05/2005                                 DISCHARGE SUMMARY   DATE OF ADMISSION:  October 02, 2005   DATE OF DISCHARGE:  October 05, 2005   ADMITTING DIAGNOSES:  1. Bilateral hip pain, left greater than right.  2. Left hip osteoarthritis.  3. Nonobstructive hypertrophic cardiomyopathy.  4. Benign prostatic hypertrophy.   DISCHARGE DIAGNOSES:  1. Status post left total hip arthroplasty.  2. Elevated INR secondary to Coumadin therapy without symptoms.  3. Benign prostatic hypertrophy.  4. Nonobstructive cardiomyopathy.   HISTORY OF PRESENT ILLNESS:  [name] is a 70 year old white male with  bilateral hip pain for approximately 2 years, left being greater than right.  The hip pain does awaken him at night.  He ____ QA MARKER: 94 ____ .  He  describes no assistive devices to ambulate.  Left hip pain inhibits his  daily activities.  Pain is deep within the left groin region with some  anterior thigh pain.  The patient has tried over the counter pain relief,  which helped ____ QA MARKER: 120 ____ .  He  relief.  X-rays of the left hip show osteoarthritis.   ALLERGIES:  PENICILLIN ALLERGY, UNSURE OF REACTION.   MEDICATIONS:  1. Vytorin 40 mg daily.  2. Avodart 0.5 mg 1 daily.  3. Aspirin 81 mg daily.  4. Tylenol as needed.   SURGICAL PROCEDURE:  The patient was taken to the operating room on October 02, 2005, by Dr. Norlene Campbell assisted by Dr. Rinaldo Ratel and Arnoldo Morale, P.A.  The patient was placed under general anesthesia and then  underwent left total hip arthroplasty.  The following components were used:  A size 15 large natural femoral component with an 8.5-mm neck  length, and 58-  mm acetabular cup with a 36-mm liner, having an outside diameter of 58 mm  and inside diameter of 36 mm, and an apex hole eliminator.  The patient  tolerated the procedure well and returned to recovery room in good stable  condition.   CONSULTATIONS:  The following consults were obtained while the patient was  hospitalized.  PT, OT, case management, pharmacy.   HOSPITAL COURSE:  Postoperatively the patient with good pain control,  afebrile, vital signs stable.  Dorsal plantar flexion 5/5, dorsal pedal  pulses were 2+ bilaterally.  X-rays showed the prosthesis to be in good  position.  No evidence of fracture.  On postoperative day 1, the patient had  some nausea and vomiting, otherwise pain under good control, tolerating  diet.  Vital signs were stable.  The patient afebrile.  H&H 12.3 and 37.3.  White count 11,100.  PT was 16.1, INR 1.3.  Postoperative day 2, the patient  afebrile, vital signs stable.  The patient alert, comfortable, with Percocet  managing pain well.  Denied any shortness of breath, chest pain, or nausea.  H&H was 11.4 and 34.2.  His INR was 2.5.  Postoperative day 3, the patient  afebrile, vital signs stable.  INR was 6.3.  No gum bleed or nose bleeds.  INR was repeated and came back at 6.2.  The patient with some dizziness,  nausea, and pain, working with physical therapy, however, the patient was  able to work with therapy the second time and later that afternoon was felt  to be ready for discharge to home and was discharged to home in stable  condition.   LABS:  Routine labs on admission:  All values within normal limits.  CBC,  coags all values within normal limits.  Routine chemistries on admission:  Glucose was slightly elevated at 101, otherwise all values within normal  limits.  Hepatic enzymes on admission:  AST 36, ALT 33, ____ QA MARKER: 383                                              ____ was low at 27, total bilirubin 0.8.  Urinalysis  on admi  negative.  X-rays of the right and left hip dated October 02, 2005, showed the  patient to be status post total hip arthroplasty without any complicating  features identified.   DISCHARGE INSTRUCTIONS:  Diet:  No restrictions.  Activity:  The patient is  partial weightbearing, 50% body weight, use crutches.  No driving for 6  weeks.  No lifting for 6 weeks.  Wound care:  The patient is to keep wound  clean and dry.  Change dressings daily.  Call if any signs of infection.  May shower after 2 days if no drainage from the wound site.   MEDICATIONS:  1. Percocet 5/325 one tablet every 4-6 hours as needed for pain.  2. Coumadin 5 mg directed by [place].  3. Aspirin.  No aspirin while on Coumadin.   FOLLOWUP:  The patient needs followup with Dr. Cleophas Dunker on October 15, 2005.  The patient is to call office at [phone] for appointment.   REFERRALS:  Genevieve Norlander for home health PT/OT and Coumadin management.  INR  draws to be performed October 07, 2005, as an outpatient.      Richardean Canal, P.A.      Claude Manges. Cleophas Dunker, M.D.  Electronically Signed    GC/MEDQ  D:  11/13/2005  T:  11/13/2005  Job:  [id number]

## 2010-08-02 NOTE — Discharge Summary (Signed)
NAMEBENTLEE, BENNINGFIELD               ACCOUNT NO.:  [id number]   MEDICAL RECORD NO.:  [id number]          PATIENT TYPE:  INP   LOCATION:  5038                         FACILITY:  [facility]   PHYSICIAN:  Claude Manges. Whitfield, M.D.DATE OF BIRTH:  1941-03-07   DATE OF ADMISSION:  06/01/2007  DATE OF DISCHARGE:  06/04/2007                               DISCHARGE SUMMARY   ADMISSION DIAGNOSIS:  Osteoarthritis of the right hip.   DISCHARGE DIAGNOSES:  1. Osteoarthritis of the right hip.  2. Hypertrophic cardiomyopathy.  3. Prostatic hypertrophy.  4. History of basal cell carcinoma.   PROCEDURE:  Right total hip arthroplasty.   HISTORY:  [name] is a very pleasant 70 year old white male,  status post left total hip arthroplasty in 2007 with good results.  Now,  having right hip pain, which has become progressive in nature.  Now  having constant aching pain and difficulty with range of motion.  Unable  to perform activities of daily living and wakes him at nighttime.  Radiographic end-stage osteoarthritis of the right hip.  Indicated for  right total hip arthroplasty.   HOSPITAL COURSE:  A 70 year old white male admitted on June 01, 2007  after appropriate laboratory studies were obtained, as well as 1 g of  vancomycin IV on-call the operating room.  He was taken to the operating  room where he underwent a right total hip arthroplasty.  A 58-mm  Pinnacle cup was used with a 15-mm diameter a large stature femoral  component.  36-mm +4 10-degree liner was used.  A +5 36-mm head was  placed also.  A Dilaudid PCA reduced dose pump was used for pain  management.  He was begun on Lovenox 30 mg subcu q.12 h. beginning on  March 18 at 8:00 a.m.  Foley was placed intraoperatively.  No Coumadin  was used at this time.  Knee immobilizer to the right knee when in bed.  Consults with PT, OT, and care management were made.  He was allowed out  of bed to chair the following day.  Weaned off his PCA.  He  did have 1  episode of pain that had to be dose with Dilaudid 0.5-1 mg x1.  On the  19th, his dressing was changed.  He was allowed to begin weightbearing  as tolerated in PT.  Remainder of his hospital course was uneventful.  He was discharged on 20th of March.  To return back to the office in 2  weeks for followup.   Chest x-ray of  May 28, 2007 was stable.  Portable pelvis of June 01, 2007 revealed no acute findings in the patient with right total hip  arthroplasty.   LABORATORY STUDIES:  Hemoglobin 15.4, hematocrit 45.5, white count 8100,  platelets 195,000.  Discharge hemoglobin 9.2, hematocrit 27.2%, white  count 9800, and platelets 124,000.  Pro time on March 13th was 13.3 and  INR 1.0, PTT was 27.  Hip screen was negative.  Electrolytes revealed  sodium 140, potassium 5.2, chloride 104, CO2 28, glucose 103, BUN 14,  creatinine 1.07, GFR greater than 60.  Discharge  sodium 138, potassium  4.0, chloride 103, CO2 28, glucose 115, BUN 4, creatinine 0.89 with GFR  greater than 60.  Preop total protein 6.5, albumin 4.3, AST 28, ALT 24,  ALP 24, total bilirubin 0.9.  Urinalysis benign for voided urine.  No  growth on culture of the urine.  Blood type A+, antibody screen  negative.   DISCHARGE INSTRUCTIONS:  No restrictions in his diet.  Use his walker,  weightbearing as tolerated.  No lifting or driving for 6 weeks.  Increase the activity slowly.  Follow the hip precaution instruction  sheet.  Change dressing on Sunday and then daily with 4x4s.  Genevieve Norlander for  home health.  Prescription for Percocet 5/325, 1-2 tablets every 4 hours  as needed for pain.  Robaxin 500 mg 1 tablet every 6 hours as needed for  spasms.  Lovenox 40 mg inject daily at 8:00 a.m. as instructed.  Colace  100 mg 1 tablet twice a day and stool softener, Dulcolax as per label  instructions as needed for constipation.  Genevieve Norlander for home health.  Follow up with Dr. Cleophas Dunker on June 14, 2007.  Call for the  followup  appointment.  Discharged in improved condition.      Oris Drone Petrarca, P.A.-C.      Claude Manges. Cleophas Dunker, M.D.  Electronically Signed    BDP/MEDQ  D:  07/06/2007  T:  07/07/2007  Job:  [id number]

## 2010-08-02 NOTE — Op Note (Signed)
[name], [name]               ACCOUNT NO.:  [id number]   MEDICAL RECORD NO.:  [id number]          PATIENT TYPE:  INP   LOCATION:  2899                         FACILITY:  [facility]   PHYSICIAN:  Claude Manges. Whitfield, M.D.DATE OF BIRTH:  02/19/1941   DATE OF PROCEDURE:  10/02/2005  DATE OF DISCHARGE:                                 OPERATIVE REPORT   PREOPERATIVE DIAGNOSIS:  End-stage osteoarthritis, left hip.   POSTOPERATIVE DIAGNOSIS:  End-stage osteoarthritis, left hip.   PROCEDURE:  Left total hip replacement.   SURGEON:  Claude Manges. Cleophas Dunker, M.D.   ASSISTANT:  Thereasa Distance A. Chaney Malling, M.D.  Arnoldo Morale, PAC   ANESTHESIA:  General.   COMPLICATIONS:  None.   COMPONENTS:  DePuy AML large stature 15 mm of femoral component with an 8.5  mm neck length, 36 mm outer diameter hip ball, a 58 mm outer diameter  acetabular component with an apex hole eliminator and the +4 Marathon 10-  degree lip polyethylene liner.   PROCEDURE:  With the patient comfortable on the operating table and under  general orotracheal anesthesia, nursing staff inserted a Foley catheter.  The patient was then placed in the lateral decubitus position with the left  side up and secured to the operating room table with the Innomed hip system.  The left hip was then prepped with Betadine scrub and then DuraPrep from  iliac crest to the ankle.  Sterile draping was performed.   Routine Southern incision was utilized and via sharp dissection, carried  down through subcutaneous tissue.  The iliotibial band was identified and  incised along the length of the skin incision.  Self-retaining retractors  were inserted.  With the hip internally rotated, the short external rotators  were identified.  Tendinous structures were tagged with zero Ethibond  suture.  The capsule was then incised along the femoral neck and head.  There was a small clear yellow joint effusion.  The head was easily  dislocated posteriorly.  The head  was misshapen and had large areas of  complete absence of articular cartilage.   Using the AML calcar guide, the head was osteotomized at a point  approximately a fingerbreadth proximal to the lesser trochanter.  The  Mueller retractor was then inserted to visualize the femoral canal.  A  starter hole was then made in the piriformis fossa.  Canal finder was  inserted.  Reaming was performed to 14.5 to accept a 15 prosthesis was we  thought fit very nicely.  Rasping was then performed sequentially to 15  small stature and then reversed to a 13.5 large stature, then a 15 large  stature.  We had a perfect cut on the calcar at a point a fingerbreadth  above the lesser trochanter.   Retractors were then placed about the acetabulum.  The labrum was sharply  excised.  Reaming was performed to 57 mm to accept a 58-mm component which  sit.  We trialed a 56 component which had nice rim fit and would completely  seat, so we elected to proceed with the 58 mm outer diameter component.  We  had circumferential acetabular cancellous bone bleeding.  The 58 mm outer  diameter 100 Series acetabular component was then impacted.  The trial +4  polyethylene acetabular liner was inserted.  We trialed several neck in  lengths on a 36 mm hip ball and then elected to the Prodigy stem to increase  the anteversion and felt that the 8.5 mm neck length of the Prodigy stem  gave [place] the most stability.  At that point, he was not tight extension.  He  was able to flex, abduct without any instability and we felt leg lengths  remained the same.   The trial components were then removed.  Throughout the case the joint and  operative area continuously irrigated with sterile saline solution.  The  apex hole eliminator was then inserted followed by the final Marathon  acetabular liner.  The wound was again irrigated with saline solution.  The  large stature 15 mm femoral stem was then impacted flush on the calcar with  the  increased Prodigy anteversion.  We trialed a 8.5 neck with a 36 hip ball  and this was perfectly stable.  Accordingly, we removed that, cleaned the  [facility] taper neck and inserted the final 36 mm hip ball with an 8.5 mm neck  length.  The entire construct was reduced and through a full range of motion  we had perfect stability.   The wound was again irrigated with saline solution.  The hip capsule was  closed anatomically with #1 Ethibond.  The short external rotators were  closed with the aforementioned Ethibond.  The iliotibial band was closed  with a running zero Vicryl, subcu closed in two layers with zero and 2-0  Vicryl, skin closed with skin clips.  Sterile bulky dressing was applied.  The patient was then placed supine on the operating room stretcher and a  knee immobilizer placed on the left lower extremity.  There were good  pulses.   The patient tolerated the procedure without complications.      Claude Manges. Cleophas Dunker, M.D.  Electronically Signed     PWW/MEDQ  D:  10/02/2005  T:  10/02/2005  Job:  [id number]

## 2010-08-02 NOTE — Assessment & Plan Note (Signed)
[facility]                              CARDIOLOGY OFFICE NOTE   NAME:Guarisco, BRUK TUMOLO                      MRN:          [id number]  DATE:09/24/2005                            DOB:          03/22/1940    SUBJECTIVE:  [name] is a very pleasant 70 year old male patient followed  by Dr. Valera Castle with a history of nonobstructive hypertrophic  cardiomyopathy.  His last echocardiogram, done November 05, 2004, revealed  moderate asymmetric left ventricular hypertrophy, impaired left ventricular  diastolic relaxation, mildly reduced EF of 45-55% with chordal SAM noted but  no clear evidence of resting LVOT gradient.  He was also noted to have mild  aortic insufficiency.  The patient has been doing well from a cardiac  perspective.  He does need a left total hip replacement secondary to  degenerative joint disease and is in the office today for clearance.  He  denies any chest discomfort, shortness of breath, nausea, diaphoresis, arm  or jaw discomfort, orthopnea or paroxysmal nocturnal dyspnea, syncope or  presyncope.  He is quite active and goes to spinning class several times a  week without any symptoms.   PAST MEDICAL HISTORY:  As noted above, significant for nonobstructive  hypertrophic cardiomyopathy, hyperlipidemia.  Denies a history of  hypertension or diabetes mellitus or thyroid disease.  He is status post  umbilical herniorrhaphy in 1972.   MEDICATIONS:  1.  Vytorin 10/40 mg daily.  2.  Avodart 0.5 mg daily.  3.  Acetaminophen p.r.n.  4.  Glucosamine.  5.  Aspirin 81 mg daily.  6.  Voltaren p.r.n.   ALLERGIES:  PENICILLIN.   SOCIAL HISTORY:  The patient denies any current tobacco abuse.  He quit 35  years ago.  He is married and has two adopted children.  He is a semi-  retired Network engineer.   REVIEW OF SYSTEMS:  Please see HPI.  Denies any fevers, chills, melena,  hematochezia, hematuria, dysuria.  Rest of review of systems  are negative.   PHYSICAL EXAMINATION:  He is a well-nourished, well-developed male.  Blood pressure 152/82.  Pulse 57.  Weight 179 pounds.  HEENT:  Unremarkable.  NECK:  Without JVD.  CARDIAC:  Normal S1 and S2.  Regular rate and rhythm.  LUNGS:  Clear to auscultation without wheezing, rhonchi, or rales.  ABDOMEN:  Soft and nontender.  Normoactive bowel sounds.  No organomegaly.  EXTREMITIES:  Without edema. Calves are soft and nontender without palpable  cords bilaterally.  Carotids are 2+ bilaterally without bruits.  NEUROLOGIC:  He is alert and oriented x3.  Cranial nerves II through XII are  grossly intact.  SKIN:  Warm and dry.  ENDOCRINE:  Without thyromegaly.  Electrocardiogram reveals sinus bradycardia with a heart rate of 57, left  axis deviation, T wave inversions in I and aVL.  Poor R wave progression.  First-degree AV block with a PR interval of 204 milliseconds.  No  significant change from previous tracing.   IMPRESSION:  1.  Nonobstructive hypertrophic cardiomyopathy with an EF of 45-55%.  2.  Treated dyslipidemia.  Recent lipid panel with HDL of 60, triglycerides      59, LDL 82, total cholesterol 119--JYNWGNFA by [employer].  3.  Degenerative joint disease.  Needs left total hip replacement.   PLAN:  The patient is also going to be examined by Dr. Daleen Squibb today.  According to ACC/AHA guidelines, he requires no further clinical workup  prior to his noncardiac surgery, and he should be at acceptable risk.  Our  service will certainly be available as necessary.  The patient will follow  up with Dr. Daleen Squibb postoperatively as scheduled.                                  Tereso Newcomer, PA-C    SW/MedQ  DD:  09/24/2005  DT:  09/24/2005  Job #:  [id number]   cc:   Claude Manges. Cleophas Dunker, MD  [facility]

## 2010-08-02 NOTE — H&P (Signed)
[name], [name]               ACCOUNT NO.:  [id number]   MEDICAL RECORD NO.:  [id number]           PATIENT TYPE:   LOCATION:                                 FACILITY:   PHYSICIAN:  Richardean Canal, P.A.    DATE OF BIRTH:  [birth year]   DATE OF ADMISSION:  10/02/2005  DATE OF DISCHARGE:                                HISTORY & PHYSICAL   CHIEF COMPLAINT:  Bilateral hip pain.   HISTORY OF PRESENT ILLNESS:  [name] is a 70 year old white male with  bilateral hip pain for approximately two years, left hip pain greater than  right.   Left hip pain awakens patient at night.  He uses no assistive device to  ambulate.  He describes mechanical symptoms.  The pain his left hip does  inhibit his activities of daily living.  Pain is deep pain within the left  groin region with some anterior thigh pain.  The patient has tried Voltaren  for pain relief.  This helps but upsets his stomach.  He also takes Tylenol  with some relief of his pain.  X-rays of the left hip show osteoarthritis.   ALLERGIES:  PENICILLIN ALLERGY, UNSURE OF REACTION.   MEDICATIONS:  1.  Vytorin 40 mg daily.  2.  Avodart 0.5 mg one daily.  3.  Aspirin 81 mg one daily.  4.  Tylenol as needed.   PAST MEDICAL HISTORY:  1.  Nonobstructive hypertropic cardiomyopathy with reported ejection      fraction 50%.  2.  Benign prostatic hypertrophy.   PAST SURGICAL HISTORY:  1.  Tonsillectomy and adenoidectomy, 1947.  2.  Umbilical hernia repair, 1975.   The patient denies any complications with the above procedures and has had  no blood transfusions.   SOCIAL HISTORY:  The patient denies any tobacco or alcohol use.  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   PRIMARY CARE PHYSICIAN:  [facility].   CARDIOLOGIST:  Jesse Sans. Wall, M.D.   REVIEW OF SYSTEMS:  The patient denies any diabetes, he does have  nonobstructive hypertrophic cardiomyopathy which Dr. Daleen Squibb  follows, has a  history of benign prostatic hypertrophy, otherwise review of systems is  negative and noncontributory.   PHYSICAL EXAMINATION:  VITAL SIGNS:  Temperature 96.6 F, pulse 60,  respiratory rate 14, blood pressure 130/80.  Approximate height 5 feet 10  inches, weight 175 pounds.  GENERAL:  The patient is well-developed, well-nourished male in no acute  distress.  He ambulates in the office on his own accord.  The patient's mood  and affect is appropriate, talks easily with the examiner.  CARDIOVASCULAR:  Regular rate and rhythm, no murmurs, rubs or gallops noted.  LUNGS:  Clear to auscultation bilaterally, no wheezing, rhonchi or rales  noted.  ABDOMEN:  Soft and nontender throughout.  Bowel sounds x4 quadrants.  No  hepatomegaly, no splenomegaly noted.  HEENT:  Head normocephalic and atraumatic without frontal or maxillary sinus  tenderness to palpation. Conjunctivae pink and moist.  Sclerae are  nonicteric.  Pupils equal, round  and reactive to light and accommodation.  Extraocular movements are intact.  No visible external ear deformities  noted.  Tympanic membranes pearly and gray bilaterally.  Nose:  Nasal septal  midline, nasal mucosa pink and moist without polyps.  Mouth mucosa pink and  moist.  Patient has good dentition.  Pharynx without erythema or exudate.  Tongue and uvula midline.  BACK:  Patient has no tenderness with palpation of his thoracic and lumbar  spine.  NECK:  Trachea is midline and no lymphadenopathy.  Carotids 2+ without  bruits.  No tenderness along the cervical spine, patient has full range of  motion of the cervical spine without pain.  NEUROLOGIC:  The patient is alert and oriented x2.  Cranial nerves 2 through  12 are grossly intact.  Deep tendon reflexes 2+ at the knees bilaterally and  2+ ankles bilaterally.  BREASTS/GENITOURINARY/RECTAL:  Exams all deferred.  MUSCULOSKELETAL:  Upper extremities:  The patient has full range of motion  in the  upper extremities.  The patient has full range of motion in the upper  extremities.  Upper extremities 3-point symmetric size and shape.  Radial  pulses are 2+ bilaterally, equal and symmetric.  Lower extremities:  Left  hip approximately 10 degrees of internal rotation, 25 degrees of external  rotation with pain.  Flexion greater than 90 degrees.  Right hip 15 to 20  degrees of internal rotation, 30 degrees of external rotation with minimal  pain.  Able to flex hip to greater than 90 degrees.  He does have  approximately 1/4 inch shortening of the left leg compared to the right.  Bilateral knees:  No effusion, no edema. He has full range of motion of both  knees without pain.  Lower extremities nonedematous.  Calves are supple.  Otherwise his lower extremities are neuromotor basically intact.   IMPRESSION:  1.  Nonobstructive hypertrophic cardiomyopathy.  2.  Left hip osteoarthritis.  3.  Benign prostatic hypertrophy.   PLAN:  The patient is to be admitted to [facility] on October 02, 2005  to undergone a left total hip arthroplasty.  Prior to surgery the patient to  undergo all preoperative labs and testing. The patient did receive clearance  from his primary care physician at [facility] and also  received clearance from Dr. Vern Claude office, who reviewed his EKG.      Richardean Canal, P.A.     GC/MEDQ  D:  09/25/2005  T:  09/25/2005  Job:  [id number]

## 2010-08-08 ENCOUNTER — Encounter: Payer: Self-pay | Admitting: Cardiology

## 2010-08-15 ENCOUNTER — Ambulatory Visit (INDEPENDENT_AMBULATORY_CARE_PROVIDER_SITE_OTHER): Payer: Medicare Other | Admitting: Cardiology

## 2010-08-15 ENCOUNTER — Encounter: Payer: Self-pay | Admitting: Cardiology

## 2010-08-15 VITALS — BP 124/78 | HR 60 | Ht 70.0 in | Wt 177.0 lb

## 2010-08-15 DIAGNOSIS — I422 Other hypertrophic cardiomyopathy: Secondary | ICD-10-CM | POA: Insufficient documentation

## 2010-08-15 DIAGNOSIS — I10 Essential (primary) hypertension: Secondary | ICD-10-CM

## 2010-08-15 DIAGNOSIS — E785 Hyperlipidemia, unspecified: Secondary | ICD-10-CM

## 2010-08-15 NOTE — Progress Notes (Signed)
HPI [name] comes in today for evaluation management of his hypertrophic cardiomyopathy. He remains very active riding his bike 55 miles per week. He is totally asymptomatic.  He said no further vasovagal syncope. He is careful to stay well hydrated. His blood pressure then running about 110 over about 70. Heart rate runs in the 50s.  He is to visit Dr. Tanya Nones for complete physical with blood work this week. He'll have his blood work  sent to me.  EKG today shows sinus rhythm with first degree block and PACs with a left axis deviation, poor R-wave progression laterally,no acute changes. Past Medical History  Diagnosis Date  . CVA (cerebral vascular accident)   . Hypertrophic cardiomyopathy   . Dyslipidemia     Past Surgical History  Procedure Date  . Basal cell carcinoma excision   . Total hip arthroplasty     right  . Total hip arthroplasty     left    No family history on file.  History   Social History  . Marital Status: Married    Spouse Name: N/A    Number of Children: N/A  . Years of Education: N/A   Occupational History  . retired    Social History Main Topics  . Smoking status: Former Smoker -- 2.0 packs/day for 10 years    Quit date: 03/17/1972  . Smokeless tobacco: Not on file  . Alcohol Use: No     quit 16 years ago  . Drug Use: Not on file  . Sexually Active: Not on file   Other Topics Concern  . Not on file   Social History Narrative  . No narrative on file    Allergies  Allergen Reactions  . Penicillins     Current Outpatient Prescriptions  Medication Sig Dispense Refill  . aspirin 81 MG tablet Take 81 mg by mouth daily.        . diphenhydramine-acetaminophen (TYLENOL PM) 25-500 MG TABS Take 1 tablet by mouth at bedtime as needed.        Marland Kitchen lisinopril (PRINIVIL,ZESTRIL) 10 MG tablet Take 10 mg by mouth daily.        Marland Kitchen VYTORIN 10-40 MG per tablet TAKE 1 TABLET DAILY  90 tablet  1  . DISCONTD: dutasteride (AVODART) 0.5 MG capsule Take 0.5  mg by mouth daily.          ROS Negative other than HPI.   PE General Appearance: well developed, well nourished in no acute distress HEENT: symmetrical face, PERRLA, good dentition  Neck: no JVD, thyromegaly, or adenopathy, trachea midline Chest: symmetric without deformity Cardiac: PMI non-displaced, RRR, normal S1, S2, no gallop, soft systolic murmur Lung: clear to ausculation and percussion Vascular: all pulses full without bruits  Abdominal: nondistended, nontender, good bowel sounds, no HSM, no bruits Extremities: no cyanosis, clubbing or edema, no sign of DVT, no varicosities  Skin: normal color, no rashes Neuro: alert and oriented x 3, non-focal Pysch: normal affect Filed Vitals:   08/15/10 1351  BP: 124/78  Pulse: 60  Height: 5\' 10"  (1.778 m)  Weight: 177 lb (80.287 kg)    EKG  Labs and Studies Reviewed.   Lab Results  Component Value Date   WBC 11.0* 05/01/2009   HGB 11.5* 05/01/2009   HCT 32.5* 05/01/2009   MCV 91.1 05/01/2009   PLT 152 05/01/2009      Chemistry      Component Value Date/Time   NA 140 05/01/2009 0528   K 4.0  05/01/2009 0528   CL 111 05/01/2009 0528   CO2 23 05/01/2009 0528   BUN 20 05/01/2009 0528   CREATININE 1.21 05/01/2009 0528      Component Value Date/Time   CALCIUM 8.3* 05/01/2009 0528   ALKPHOS 20* 05/01/2009 0528   AST 18 05/01/2009 0528   ALT 14 05/01/2009 0528   BILITOT 1.1 05/01/2009 0528       Lab Results  Component Value Date   CHOL  Value: 124        ATP III CLASSIFICATION:  <200     mg/dL   Desirable  161-096  mg/dL   Borderline High  >=045    mg/dL   High        40/11/8117   CHOL 125 01/27/2007   Lab Results  Component Value Date   HDL 57 01/15/2009   HDL 50.0 01/27/2007   Lab Results  Component Value Date   LDLCALC  Value: 56        Total Cholesterol/HDL:CHD Risk Coronary Heart Disease Risk Table                     Men   Women  1/2 Average Risk   3.4   3.3  Average Risk       5.0   4.4  2 X Average Risk   9.6   7.1  3  X Average Risk  23.4   11.0        Use the calculated Patient Ratio above and the CHD Risk Table to determine the patient's CHD Risk.        ATP III CLASSIFICATION (LDL):  <100     mg/dL   Optimal  147-829  mg/dL   Near or Above                    Optimal  130-159  mg/dL   Borderline  562-130  mg/dL   High  >865     mg/dL   Very High 78/06/6960   LDLCALC 63 01/27/2007   Lab Results  Component Value Date   TRIG 56 01/15/2009   TRIG 58 01/27/2007   Lab Results  Component Value Date   CHOLHDL 2.2 01/15/2009   CHOLHDL 2.5 CALC 01/27/2007   No results found for this basename: HGBA1C   Lab Results  Component Value Date   ALT 14 05/01/2009   AST 18 05/01/2009   ALKPHOS 20* 05/01/2009   BILITOT 1.1 05/01/2009   Lab Results  Component Value Date   TSH 1.091 **Test methodology is 3rd generation TSH** 04/30/2009

## 2010-08-15 NOTE — Assessment & Plan Note (Signed)
Excellent control, follow up with primary care.

## 2010-08-15 NOTE — Patient Instructions (Addendum)
Your physician recommends that you schedule a follow-up appointment 1 year with Dr. Daleen Squibb

## 2010-08-15 NOTE — Assessment & Plan Note (Addendum)
Excellent control. No change in medication.

## 2010-08-16 ENCOUNTER — Encounter: Payer: Self-pay | Admitting: Cardiology

## 2010-09-30 ENCOUNTER — Telehealth: Payer: Self-pay | Admitting: Cardiology

## 2010-09-30 NOTE — Telephone Encounter (Signed)
All Cardiac faxed to [facility]/[place] [phone]

## 2010-10-03 ENCOUNTER — Other Ambulatory Visit: Payer: Self-pay | Admitting: Cardiology

## 2010-10-04 ENCOUNTER — Encounter: Payer: Self-pay | Admitting: Internal Medicine

## 2010-10-04 ENCOUNTER — Encounter: Payer: Self-pay | Admitting: Family Medicine

## 2010-10-04 DIAGNOSIS — K648 Other hemorrhoids: Secondary | ICD-10-CM | POA: Insufficient documentation

## 2010-10-04 DIAGNOSIS — N400 Enlarged prostate without lower urinary tract symptoms: Secondary | ICD-10-CM | POA: Insufficient documentation

## 2010-10-04 DIAGNOSIS — K5792 Diverticulitis of intestine, part unspecified, without perforation or abscess without bleeding: Secondary | ICD-10-CM

## 2010-10-04 DIAGNOSIS — Z8673 Personal history of transient ischemic attack (TIA), and cerebral infarction without residual deficits: Secondary | ICD-10-CM | POA: Insufficient documentation

## 2010-10-28 ENCOUNTER — Encounter: Payer: Self-pay | Admitting: Internal Medicine

## 2010-10-28 ENCOUNTER — Ambulatory Visit (INDEPENDENT_AMBULATORY_CARE_PROVIDER_SITE_OTHER): Payer: Medicare Other | Admitting: Internal Medicine

## 2010-10-28 VITALS — BP 84/58 | HR 80 | Ht 70.0 in | Wt 179.0 lb

## 2010-10-28 DIAGNOSIS — K921 Melena: Secondary | ICD-10-CM

## 2010-10-28 DIAGNOSIS — Z860100 Personal history of colon polyps, unspecified: Secondary | ICD-10-CM | POA: Insufficient documentation

## 2010-10-28 MED ORDER — PEG-KCL-NACL-NASULF-NA ASC-C 100 G PO SOLR: 1.0000 | ORAL | Status: DC

## 2010-10-28 NOTE — Progress Notes (Addendum)
Subjective:    Patient ID: [name], male    DOB: [birth year], 69 y.o.   MRN: [id number]  HPI 70 yo male with hx of hypertrophic cardiomyopathy, TIA, HTN, and adenomatous colon polyps who presents to discuss heme + stool.  Pt reports that he is feeling well, without specific complaints.  No abd pain, n/v.  No diarrhea, constipation, rectal bleeding or hematochezia.  No change in bowel habits.  Good energy level, weight is stable, good appetite.  No f/c/ns.     Review of Systems  Constitutional: Negative.   HENT: Negative.   Eyes: Negative.   Respiratory: Negative.   Cardiovascular: Negative.   Gastrointestinal: Negative.   Genitourinary: Negative.   Musculoskeletal: Negative.   Skin: Negative.   Neurological: Negative.   Hematological: Negative.   Psychiatric/Behavioral: Negative.    Past Medical History  Diagnosis Date  . Hypertrophic cardiomyopathy   . Diverticulosis   . Hemorrhoids   . BPH (benign prostatic hyperplasia)   . TIA (transient ischemic attack)   . Hypertension   . Hyperlipidemia    Current outpatient prescriptions:AMBULATORY NON FORMULARY MEDICATION, Multivitamin energy drink Drink on every day , Disp: , Rfl: ;  aspirin 81 MG tablet, Take 81 mg by mouth daily.  , Disp: , Rfl: ;  diphenhydramine-acetaminophen (TYLENOL PM) 25-500 MG TABS, Take 1 tablet by mouth at bedtime as needed.  , Disp: , Rfl: ;  lisinopril (PRINIVIL,ZESTRIL) 10 MG tablet, TAKE 1 TABLET DAILY, Disp: 90 tablet, Rfl: 2 peg 3350 powder (MOVIPREP) 100 G SOLR, Take 1 kit (100 g total) by mouth as directed. See written handout, Disp: 1 kit, Rfl: 0;  VYTORIN 10-40 MG per tablet, TAKE 1 TABLET DAILY, Disp: 90 tablet, Rfl: 1   Family History  Problem Relation Age of Onset  . Colon polyps Brother   . Tuberculosis Brother     and father  . Breast cancer Mother    SH: former smoker, quit 1982, no etoh, no illicits.  Retired from [employer].       Objective:   Physical Exam    Constitutional: He is oriented to person, place, and time. He appears well-developed and well-nourished. No distress.  HENT:  Head: Normocephalic and atraumatic.  Mouth/Throat: Oropharynx is clear and moist. No oropharyngeal exudate.  Eyes: Conjunctivae and EOM are normal. Pupils are equal, round, and reactive to light.  Neck: Neck supple. No tracheal deviation present.  Cardiovascular: Normal rate and regular rhythm.   No murmur heard. Pulmonary/Chest: Effort normal and breath sounds normal.  Abdominal: Soft. Bowel sounds are normal. He exhibits no distension and no mass. There is no tenderness.  Musculoskeletal: He exhibits no edema.  Lymphadenopathy:    He has no cervical adenopathy.  Neurological: He is alert and oriented to person, place, and time.  Skin: Skin is warm and dry.  Psychiatric: He has a normal mood and affect.   Labs: +fobt 08/19/10  08/16/10 - Crt 1.22, unremarkable hepatic panel  - WBC 7.2, Hgb 14.1, HCT 43.3, Plt 206  Colonoscopy: 10/2006 - diverticulosis, internal hemorrhoids (No polyps) 09/2003 - 2 small polyps (path unknown, pt believes adenomatous)    Assessment & Plan:  70 yo male with hx of hypertrophic cardiomyopathy, TIA, HTN, and adenomatous colon polyps who presents to discuss heme + stool  1. Heme + stool - pt with hx of what is believed to be adenomatous polyps.  Last surveillance was 4 years ago with recommendation for repeat colonoscopy in 5 years.  However, given the result of +fobt, I will schedule one for him now.  Surveillance colonoscopy thereafter to be based on findings, but no longer than 5 yrs.  Likely can d/c future FOBT as pt is on a screening/surveillance schedule with colonoscopy.  Addendum: Path results received from colonoscopy 09/27/2003: fragments of tubulovillous adenoma, no high-grade dysplasia or malignancy identified.  Path results received from colonoscopy 10/08/2000: Fragments of tubular adenoma without high-grade dysplasia or  malignancy from the proximal ascending colon and sigmoid colon.

## 2010-10-28 NOTE — Patient Instructions (Signed)
You have been scheduled for a Colonoscopy. Your prep has been sent to your pharmacy.

## 2010-11-11 ENCOUNTER — Encounter: Payer: Self-pay | Admitting: Internal Medicine

## 2010-11-11 ENCOUNTER — Ambulatory Visit (AMBULATORY_SURGERY_CENTER): Payer: Medicare Other | Admitting: Internal Medicine

## 2010-11-11 VITALS — BP 121/75 | HR 92 | Temp 96.7°F | Resp 20 | Ht 70.0 in | Wt 175.0 lb

## 2010-11-11 DIAGNOSIS — Z1211 Encounter for screening for malignant neoplasm of colon: Secondary | ICD-10-CM

## 2010-11-11 DIAGNOSIS — Z860100 Personal history of colon polyps, unspecified: Secondary | ICD-10-CM

## 2010-11-11 DIAGNOSIS — K921 Melena: Secondary | ICD-10-CM

## 2010-11-11 MED ORDER — SODIUM CHLORIDE 0.9 % IV SOLN: 500.0000 mL | INTRAVENOUS | Status: DC

## 2010-11-11 NOTE — Patient Instructions (Signed)
Follow discharge instructions.  Continue your medications.  Next Colonoscopy in 5 years.

## 2010-11-12 ENCOUNTER — Telehealth: Payer: Self-pay

## 2010-11-12 NOTE — Telephone Encounter (Signed)
Left message on answering machine. 

## 2010-11-20 ENCOUNTER — Ambulatory Visit (INDEPENDENT_AMBULATORY_CARE_PROVIDER_SITE_OTHER): Payer: Medicare Other | Admitting: Physician Assistant

## 2010-11-20 ENCOUNTER — Ambulatory Visit (HOSPITAL_COMMUNITY): Payer: Medicare Other | Attending: Family Medicine

## 2010-11-20 ENCOUNTER — Encounter: Payer: Self-pay | Admitting: Physician Assistant

## 2010-11-20 VITALS — BP 132/82 | HR 62 | Ht 70.0 in | Wt 180.0 lb

## 2010-11-20 DIAGNOSIS — I4892 Unspecified atrial flutter: Secondary | ICD-10-CM | POA: Insufficient documentation

## 2010-11-20 DIAGNOSIS — I080 Rheumatic disorders of both mitral and aortic valves: Secondary | ICD-10-CM | POA: Insufficient documentation

## 2010-11-20 DIAGNOSIS — I10 Essential (primary) hypertension: Secondary | ICD-10-CM | POA: Insufficient documentation

## 2010-11-20 DIAGNOSIS — I079 Rheumatic tricuspid valve disease, unspecified: Secondary | ICD-10-CM | POA: Insufficient documentation

## 2010-11-20 DIAGNOSIS — E785 Hyperlipidemia, unspecified: Secondary | ICD-10-CM | POA: Insufficient documentation

## 2010-11-20 DIAGNOSIS — I4891 Unspecified atrial fibrillation: Secondary | ICD-10-CM

## 2010-11-20 DIAGNOSIS — I379 Nonrheumatic pulmonary valve disorder, unspecified: Secondary | ICD-10-CM | POA: Insufficient documentation

## 2010-11-20 DIAGNOSIS — Z7901 Long term (current) use of anticoagulants: Secondary | ICD-10-CM

## 2010-11-20 DIAGNOSIS — Z8673 Personal history of transient ischemic attack (TIA), and cerebral infarction without residual deficits: Secondary | ICD-10-CM

## 2010-11-20 DIAGNOSIS — I422 Other hypertrophic cardiomyopathy: Secondary | ICD-10-CM

## 2010-11-20 MED ORDER — VERAPAMIL HCL 40 MG PO TABS: 40.0000 mg | ORAL_TABLET | ORAL | Status: DC | PRN

## 2010-11-20 MED ORDER — WARFARIN SODIUM 5 MG PO TABS: 5.0000 mg | ORAL_TABLET | Freq: Every day | ORAL | Status: DC

## 2010-11-20 NOTE — Assessment & Plan Note (Addendum)
Patient has new onset atrial flutter that I suspect probably happened one half weeks ago. He had trouble with rapid heartbeat while riding his bike as stated above. He has not ridden since and his heart rate is 60 beats per minute. He is asymptomatic with this. I discussed this patient with Dr. Johney Frame who recommends Coumadin with his history of TIA, hypertension, and atrial flutter. He would like to see him Monday September 17 at 8:45 to discuss ablation. We will get a 2-D echo prior to that visit.  The patient does have history of heme positive stools but recently had a colonoscopy 11/11/10 that showe  mild diverticulosis. He also states he has hemorrhoids.  We have given the patient Calan  40 mg to take p.r.n. for rapid heartbeat.

## 2010-11-20 NOTE — Patient Instructions (Addendum)
Your physician recommends that you schedule a follow-up appointment in: 9/17 @ 8:45 with Dr Johney Frame Your physician recommends that you schedule a follow-up appointment in: 9/7 @ 2:30 with CVRR  Your physician has recommended you make the following change in your medication: START Warfarin 5 mg daily  START Calan 30 mg as needed for Heart Rate over 100 bpm  Your physician has requested that you have an echocardiogram. Echocardiography is a painless test that uses sound waves to create images of your heart. It provides your doctor with information about the size and shape of your heart and how well your heart's chambers and valves are working. This procedure takes approximately one hour. There are no restrictions for this procedure.

## 2010-11-20 NOTE — Assessment & Plan Note (Signed)
Blood pressure is on the low side. He has cut back on his lisinopril to 5 mg daily. I agree with keeping him at this dose.

## 2010-11-20 NOTE — Assessment & Plan Note (Signed)
We will start the patient on Coumadin today. He does have a history of a TIA in 2010. He also has history of heme positive stools but had an endoscopy 11/11/2010 showing mild diverticulosis otherwise normal. He has an appointment the Coumadin clinic on Friday.

## 2010-11-20 NOTE — Progress Notes (Signed)
HPI: This is a 70 year old white male patient who has a history of hypertrophic cardiomyopathy followed by Dr. Lorette Ang. His last echo in May 2011 showed asymmetric septal hypertrophy that was mild with mild chordal SAM but no SAM of the mitral leaflet. There is no left ventricular outflow tract gradient. Ejection fraction 65%. His blood pressures been controlled with Zestril. He actually has had low blood pressure recently and he decreased his dose from 10 mg to 5 mg daily. He keeps close track of this and his blood pressures have been stable on the lower dose.  The patient presents today complaining of an episode of rapid heart beat that occurred while biking one and a half weeks ago. He wears a heart rate monitor with his GPS and he noticed several spikes of 150 beats per minute up to 200 beats per minute. He had already been riding about 15 miles at this point and did feel some fatigue. He did not feel palpitations, his heart racing, dyspnea, chest pain, dizziness, or presyncope. He states it was very humid that day and felt that may have had something to do with it. He rides 25 miles 3 days a week but not at extremely fast pace. He usually gets his heart rate up to 115-120 and when riding in a flat area and 140-150 beats per minute when riding hills.  The patient also has a history of a TIA in 2010 in the setting of hypertension.  Allergies  Allergen Reactions  . Penicillins     Current Outpatient Prescriptions on File Prior to Visit  Medication Sig Dispense Refill  . AMBULATORY NON FORMULARY MEDICATION Multivitamin energy drink Drink on every day       . aspirin 81 MG tablet Take 81 mg by mouth daily.        . diphenhydramine-acetaminophen (TYLENOL PM) 25-500 MG TABS Take 1 tablet by mouth at bedtime as needed.        Marland Kitchen lisinopril (PRINIVIL,ZESTRIL) 10 MG tablet TAKE 1 TABLET DAILY  90 tablet  2  . VYTORIN 10-40 MG per tablet TAKE 1 TABLET DAILY  90 tablet  1    Past Medical History    Diagnosis Date  . Hypertrophic cardiomyopathy   . Diverticulosis   . Hemorrhoids   . BPH (benign prostatic hyperplasia)   . TIA (transient ischemic attack)   . Hypertension   . Hyperlipidemia     Past Surgical History  Procedure Date  . Basal cell carcinoma excision   . Total hip arthroplasty     right  . Total hip arthroplasty     left  . Tonsillectomy   . Umbilical hernia repair     Family History  Problem Relation Age of Onset  . Colon polyps Brother   . Tuberculosis Brother     and father  . Breast cancer Mother     History   Social History  . Marital Status: Married    Spouse Name: N/A    Number of Children: 2  . Years of Education: N/A   Occupational History  . retired    Social History Main Topics  . Smoking status: Former Smoker -- 2.0 packs/day for 10 years    Types: Cigarettes    Quit date: 03/17/1970  . Smokeless tobacco: Never Used  . Alcohol Use: No     quit 16 years ago  . Drug Use: No  . Sexually Active: Not on file   Other Topics Concern  . Not  on file   Social History Narrative  . No narrative on file    ROS: See HPI Eyes: Negative Ears:Negative for hearing loss, tinnitus Cardiovascular: Negative for chest pain, dyspnea, dyspnea on exertion, near-syncope, orthopnea, paroxysmal nocturnal dyspnia and syncope,edema, claudication, cyanosis,.  Respiratory:   Negative for cough, hemoptysis, shortness of breath, sleep disturbances due to breathing, sputum production and wheezing.   Endocrine: Negative for cold intolerance and heat intolerance.  Hematologic/Lymphatic: Negative for adenopathy and bleeding problem. Does not bruise/bleed easily.  Musculoskeletal: Negative.   Gastrointestinal: Negative for nausea, vomiting, reflux, abdominal pain, diarrhea, constipation.  Patient did have heme positive stools recently but had colonoscopy that was clear Neurological: Negative.  Allergic/Immunologic: Negative for environmental  allergies.   PHYSICAL EXAM: Well-nournished, in no acute distress. Neck: No JVD, HJR, Bruit, or thyroid enlargement Lungs: No tachypnea, clear without wheezing, rales, or rhonchi Cardiovascular: irregular,PMI not displaced, 1-2/6 systolic murmur at the left sternal border, no gallops, bruit, thrill, or heave. Abdomen: BS normal. Soft without organomegaly, masses, lesions or tenderness. Extremities: without cyanosis, clubbing or edema. Good distal pulses bilateral SKin: Warm, no lesions or rashes  Musculoskeletal: No deformities Neuro: no focal signs  BP 132/82  Pulse 62  Ht 5\' 10"  (1.778 m)  Wt 180 lb (81.647 kg)  BMI 25.83 kg/m2  WUJ:WJXBJY flutter at 60 beats per minute

## 2010-11-20 NOTE — Assessment & Plan Note (Signed)
Patient has history of TIA in 2010. We'll place him on Coumadin with close surveillance.

## 2010-11-20 NOTE — Assessment & Plan Note (Signed)
Patient had no LVOT gradient on echo in January 2012. We will repeat echo.

## 2010-11-22 ENCOUNTER — Ambulatory Visit (INDEPENDENT_AMBULATORY_CARE_PROVIDER_SITE_OTHER): Payer: Medicare Other | Admitting: *Deleted

## 2010-11-22 DIAGNOSIS — Z7901 Long term (current) use of anticoagulants: Secondary | ICD-10-CM

## 2010-11-22 DIAGNOSIS — I4892 Unspecified atrial flutter: Secondary | ICD-10-CM

## 2010-11-22 LAB — POCT INR: INR: 1.8

## 2010-11-22 MED ORDER — WARFARIN SODIUM 1 MG PO TABS: ORAL_TABLET | ORAL | Status: DC

## 2010-11-26 ENCOUNTER — Encounter: Payer: Medicare Other | Admitting: *Deleted

## 2010-11-27 LAB — PROTIME-INR: INR: 1.4 — AB (ref 0.9–1.1)

## 2010-11-28 ENCOUNTER — Ambulatory Visit (INDEPENDENT_AMBULATORY_CARE_PROVIDER_SITE_OTHER): Payer: Self-pay | Admitting: Cardiology

## 2010-11-28 DIAGNOSIS — R0989 Other specified symptoms and signs involving the circulatory and respiratory systems: Secondary | ICD-10-CM

## 2010-12-02 ENCOUNTER — Encounter: Payer: Self-pay | Admitting: Internal Medicine

## 2010-12-02 ENCOUNTER — Ambulatory Visit (INDEPENDENT_AMBULATORY_CARE_PROVIDER_SITE_OTHER): Payer: Medicare Other | Admitting: Internal Medicine

## 2010-12-02 ENCOUNTER — Ambulatory Visit: Payer: Self-pay | Admitting: Internal Medicine

## 2010-12-02 ENCOUNTER — Ambulatory Visit (INDEPENDENT_AMBULATORY_CARE_PROVIDER_SITE_OTHER): Payer: Medicare Other | Admitting: *Deleted

## 2010-12-02 ENCOUNTER — Encounter: Payer: Self-pay | Admitting: *Deleted

## 2010-12-02 VITALS — BP 122/77 | HR 64 | Ht 70.0 in | Wt 182.0 lb

## 2010-12-02 DIAGNOSIS — I4892 Unspecified atrial flutter: Secondary | ICD-10-CM

## 2010-12-02 DIAGNOSIS — Z7901 Long term (current) use of anticoagulants: Secondary | ICD-10-CM

## 2010-12-02 LAB — POCT INR: INR: 1.8

## 2010-12-02 NOTE — Assessment & Plan Note (Signed)
Nonobstructive by recent echo EF midly depressed, possibly due to atrial flutter with elevated rates Will proceed with ablation as above.

## 2010-12-02 NOTE — Assessment & Plan Note (Signed)
The patient presents today for EP consultation regarding typical appearing atrial flutter.  He is mostly asymptomatic but does have fatigue with elevated heart rates.  His CHADSVASC score is 4 (prior TIA, age >7, and HTN).  He is on coumadin but subtherapeutic.  I will switch him to pradaxa 150mg  BID today.  Therapeutic strategies for atrial flutter including medicine and ablation were discussed in detail with the patient today. Risk, benefits, and alternatives to EP study and radiofrequency ablation were also discussed in detail today. These risks include but are not limited to stroke, bleeding, vascular damage, tamponade, perforation, damage to heart other structures, AV block requiring PPM, worsening renal function, and death. The patient understands these risk and wishes to proceed.  We will therefore proceed with catheter ablation once the patient has been adequately anticoagulated.

## 2010-12-02 NOTE — Progress Notes (Signed)
[name] is a pleasant 70 y.o. WM patient with a h/o mild asymmetric septal hypertrophy, HTN, and recently diagnosed atrial flutter who presents today for EP consultation regarding his atrial flutter.  He reports recently being diagnosed with atrial flutter after presenting to our office with symptoms of rapid heart beat that occurred while biking. He wears a heart rate monitor with his GPS and he noticed several spikes of 150 beats per minute up to 200 beats per minute. He had already been riding about 15 miles at this point and did feel some fatigue.  He remains very active for his age.  He feels that he is mostly asymptomatic with his atrial flutter. The patient also has a history of a TIA in 2010.  Last echo in 11/20/10 showed mild asymmetric septal hypertrophy no SAM. There is no left ventricular outflow tract gradient. Ejection fraction 45%.  LA size 42mm  Today, he denies symptoms of palpitations, chest pain, shortness of breath, orthopnea, PND, lower extremity edema, dizziness, presyncope, syncope, or neurologic sequela. The patient is tolerating medications without difficulties and is otherwise without complaint today.   Past Medical History  Diagnosis Date  . Hypertrophic cardiomyopathy   . Diverticulosis   . Hemorrhoids   . BPH (benign prostatic hyperplasia)   . TIA (transient ischemic attack)   . Hypertension   . Hyperlipidemia    Past Surgical History  Procedure Date  . Basal cell carcinoma excision   . Total hip arthroplasty     right  . Total hip arthroplasty     left  . Tonsillectomy   . Umbilical hernia repair     Current Outpatient Prescriptions  Medication Sig Dispense Refill  . AMBULATORY NON FORMULARY MEDICATION Multivitamin energy drink Drink on every day       . aspirin 81 MG tablet Take 81 mg by mouth daily.        . diphenhydramine-acetaminophen (TYLENOL PM) 25-500 MG TABS Take 1 tablet by mouth at bedtime as needed.        Marland Kitchen lisinopril  (PRINIVIL,ZESTRIL) 10 MG tablet TAKE 1 TABLET DAILY  90 tablet  2  . verapamil (CALAN) 40 MG tablet Take 1 tablet (40 mg total) by mouth as needed.  30 tablet  2  . VYTORIN 10-40 MG per tablet TAKE 1 TABLET DAILY  90 tablet  1  . warfarin (COUMADIN) 1 MG tablet Take as directed by Anticoagulation clinic  45 tablet  3    Allergies  Allergen Reactions  . Penicillins     History   Social History  . Marital Status: Married    Spouse Name: N/A    Number of Children: 2  . Years of Education: N/A   Occupational History  . retired    Social History Main Topics  . Smoking status: Former Smoker -- 2.0 packs/day for 10 years    Types: Cigarettes    Quit date: 03/17/1970  . Smokeless tobacco: Never Used  . Alcohol Use: No     quit 16 years ago  . Drug Use: No  . Sexually Active: Not on file   Other Topics Concern  . Not on file   Social History Narrative   Lives in [place] with spouse.  2 Grown children.  Retired from Doctor, general practice    Family History  Problem Relation Age of Onset  . Colon polyps Brother   . Tuberculosis Brother     and father  . Breast cancer Mother  ROS- All systems are reviewed and negative except as per the HPI above  Physical Exam: Filed Vitals:   12/02/10 0901  BP: 122/77  Pulse: 64  Height: 5\' 10"  (1.778 m)  Weight: 182 lb (82.555 kg)    GEN- The patient is well appearing, alert and oriented x 3 today.   Head- normocephalic, atraumatic Eyes-  Sclera clear, conjunctiva pink Ears- hearing intact Oropharynx- clear Neck- supple, no JVP Lymph- no cervical lymphadenopathy Lungs- Clear to ausculation bilaterally, normal work of breathing Heart- irregular rate and rhythm, no murmurs, rubs or gallops, PMI not laterally displaced GI- soft, NT, ND, + BS Extremities- no clubbing, cyanosis, or edema MS- no significant deformity or atrophy Skin- no rash or lesion Psych- euthymic mood, full affect Neuro- strength and sensation are  intact  EKG today reveals typical appearing atrial flutter, LAD,  V rate 64 bpm,k nonspecific ST/T changes Echo as above  Assessment and Plan:

## 2010-12-02 NOTE — Assessment & Plan Note (Signed)
Stable No change required today  

## 2010-12-02 NOTE — Patient Instructions (Addendum)
Your physician has recommended you make the following change in your medication:  1) Stop coumadin. 2) Start Pradaxa 150mg  one tablet by mouth twice daily- start today (9/17)  Your physician has recommended that you have an a-flutter ablation. Catheter ablation is a medical procedure used to treat some cardiac arrhythmias (irregular heartbeats). During catheter ablation, a long, thin, flexible tube is put into a blood vessel in your groin (upper thigh), or neck. This tube is called an ablation catheter. It is then guided to your heart through the blood vessel. Radio frequency waves destroy small areas of heart tissue where abnormal heartbeats may cause an arrhythmia to start. Please see the instruction sheet given to you today.  We will schedule you to see Dr. Daleen Squibb prior to your ablation per your request.

## 2010-12-05 ENCOUNTER — Encounter: Payer: Self-pay | Admitting: *Deleted

## 2010-12-05 ENCOUNTER — Other Ambulatory Visit: Payer: Self-pay | Admitting: *Deleted

## 2010-12-05 DIAGNOSIS — I4892 Unspecified atrial flutter: Secondary | ICD-10-CM

## 2010-12-05 NOTE — Progress Notes (Signed)
Preop labs

## 2010-12-06 ENCOUNTER — Telehealth: Payer: Self-pay | Admitting: *Deleted

## 2010-12-06 ENCOUNTER — Ambulatory Visit: Payer: Medicare Other | Admitting: Cardiology

## 2010-12-06 NOTE — Telephone Encounter (Signed)
Attempted to call patient to schedule flutter ablation.  Per Dr Daleen Squibb and Allred he is agreeable to proceed.  Unable to reach patient.  Will forward to Dennis Bast to try again on Monday.

## 2010-12-09 ENCOUNTER — Encounter: Payer: Medicare Other | Admitting: *Deleted

## 2010-12-09 LAB — PROTIME-INR
INR: 1
INR: 1.3
Prothrombin Time: 13.3
Prothrombin Time: 16.1 — ABNORMAL HIGH

## 2010-12-09 LAB — DIFFERENTIAL
Basophils Absolute: 0
Basophils Relative: 0
Eosinophils Absolute: 0.1
Eosinophils Relative: 1
Lymphocytes Relative: 32
Lymphs Abs: 2.6
Monocytes Absolute: 0.8
Monocytes Relative: 10
Neutro Abs: 4.6
Neutrophils Relative %: 57

## 2010-12-09 LAB — URINALYSIS, ROUTINE W REFLEX MICROSCOPIC
Bilirubin Urine: NEGATIVE
Glucose, UA: NEGATIVE
Hgb urine dipstick: NEGATIVE
Ketones, ur: NEGATIVE
Nitrite: NEGATIVE
Protein, ur: NEGATIVE
Specific Gravity, Urine: 1.014
Urobilinogen, UA: 0.2
pH: 6

## 2010-12-09 LAB — BASIC METABOLIC PANEL
BUN: 10
BUN: 4 — ABNORMAL LOW
BUN: 6
CO2: 28
CO2: 29
CO2: 30
Calcium: 8 — ABNORMAL LOW
Calcium: 8.1 — ABNORMAL LOW
Calcium: 8.1 — ABNORMAL LOW
Chloride: 103
Chloride: 106
Chloride: 99
Creatinine, Ser: 0.89
Creatinine, Ser: 0.97
Creatinine, Ser: 0.99
GFR calc Af Amer: >60
GFR calc Af Amer: >60
GFR calc Af Amer: >60
GFR calc non Af Amer: >60
GFR calc non Af Amer: >60
GFR calc non Af Amer: >60
Glucose, Bld: 115 — ABNORMAL HIGH
Glucose, Bld: 116 — ABNORMAL HIGH
Glucose, Bld: 133 — ABNORMAL HIGH
Potassium: 4
Potassium: 4.3
Potassium: 4.4
Sodium: 133 — ABNORMAL LOW
Sodium: 138
Sodium: 138

## 2010-12-09 LAB — CBC
HCT: 45.5
HCT: 27.2 — ABNORMAL LOW
HCT: 30.6 — ABNORMAL LOW
HCT: 31.1 — ABNORMAL LOW
Hemoglobin: 15.4
Hemoglobin: 10.3 — ABNORMAL LOW
Hemoglobin: 10.6 — ABNORMAL LOW
Hemoglobin: 9.2 — ABNORMAL LOW
MCHC: 33.8
MCHC: 33.8
MCHC: 33.9
MCHC: 34.1
MCV: 89.9
MCV: 90.9
MCV: 91.7
MCV: 91.7
Platelets: 195
Platelets: 122 — ABNORMAL LOW
Platelets: 124 — ABNORMAL LOW
Platelets: 132 — ABNORMAL LOW
RBC: 5.06
RBC: 2.97 — ABNORMAL LOW
RBC: 3.34 — ABNORMAL LOW
RBC: 3.43 — ABNORMAL LOW
RDW: 13.2
RDW: 12.6
RDW: 12.8
RDW: 13.2
WBC: 8.1
WBC: 10.1
WBC: 11 — ABNORMAL HIGH
WBC: 9.8

## 2010-12-09 LAB — CROSSMATCH
ABO/RH(D): A POS
Antibody Screen: NEGATIVE

## 2010-12-09 LAB — URINE CULTURE
Colony Count: NO GROWTH
Culture: NO GROWTH

## 2010-12-09 LAB — COMPREHENSIVE METABOLIC PANEL
ALT: 24
AST: 28
Albumin: 4.3
Alkaline Phosphatase: 24 — ABNORMAL LOW
BUN: 14
CO2: 28
Calcium: 9.8
Chloride: 104
Creatinine, Ser: 1.07
GFR calc Af Amer: >60
GFR calc non Af Amer: >60
Glucose, Bld: 103 — ABNORMAL HIGH
Potassium: 5.2 — ABNORMAL HIGH
Sodium: 140
Total Bilirubin: 0.9
Total Protein: 6.5

## 2010-12-09 LAB — HEPARIN ANTIBODY SCREEN: Heparin Antibody Screen: NEGATIVE

## 2010-12-09 LAB — APTT: aPTT: 25

## 2010-12-10 NOTE — Telephone Encounter (Signed)
Set up on 12/02/10  Patient has instructions

## 2010-12-16 HISTORY — PX: ATRIAL ABLATION SURGERY: SHX560

## 2010-12-24 ENCOUNTER — Other Ambulatory Visit (INDEPENDENT_AMBULATORY_CARE_PROVIDER_SITE_OTHER): Payer: Medicare Other | Admitting: *Deleted

## 2010-12-24 DIAGNOSIS — I4892 Unspecified atrial flutter: Secondary | ICD-10-CM

## 2010-12-24 LAB — BASIC METABOLIC PANEL
BUN: 24 mg/dL — ABNORMAL HIGH (ref 6–23)
CO2: 26 mEq/L (ref 19–32)
Calcium: 8.9 mg/dL (ref 8.4–10.5)
Chloride: 105 mEq/L (ref 96–112)
Creatinine, Ser: 1.2 mg/dL (ref 0.4–1.5)
GFR: 65.37 mL/min (ref >60.00)
Glucose, Bld: 98 mg/dL (ref 70–99)
Potassium: 4.7 mEq/L (ref 3.5–5.1)
Sodium: 139 mEq/L (ref 135–145)

## 2010-12-24 LAB — CBC WITH DIFFERENTIAL/PLATELET
Basophils Absolute: 0 10*3/uL (ref 0.0–0.1)
Basophils Relative: 0.5 % (ref 0.0–3.0)
Eosinophils Absolute: 0.2 10*3/uL (ref 0.0–0.7)
Eosinophils Relative: 2.5 % (ref 0.0–5.0)
HCT: 42.3 % (ref 39.0–52.0)
Hemoglobin: 13.8 g/dL (ref 13.0–17.0)
Lymphocytes Relative: 24.4 % (ref 12.0–46.0)
Lymphs Abs: 2.2 10*3/uL (ref 0.7–4.0)
MCHC: 32.6 g/dL (ref 30.0–36.0)
MCV: 92.2 fl (ref 78.0–100.0)
Monocytes Absolute: 1 10*3/uL (ref 0.1–1.0)
Monocytes Relative: 10.4 % (ref 3.0–12.0)
Neutro Abs: 5.7 10*3/uL (ref 1.4–7.7)
Neutrophils Relative %: 62.2 % (ref 43.0–77.0)
Platelets: 173 10*3/uL (ref 150.0–400.0)
RBC: 4.59 Mil/uL (ref 4.22–5.81)
RDW: 12.8 % (ref 11.5–14.6)
WBC: 9.1 10*3/uL (ref 4.5–10.5)

## 2010-12-24 LAB — PROTIME-INR
INR: 1.3 ratio — ABNORMAL HIGH (ref 0.8–1.0)
Prothrombin Time: 14.3 s — ABNORMAL HIGH (ref 10.2–12.4)

## 2010-12-26 ENCOUNTER — Ambulatory Visit (HOSPITAL_COMMUNITY)
Admission: RE | Admit: 2010-12-26 | Discharge: 2010-12-27 | Disposition: A | Discharge status: Home Health | Payer: Medicare Other | Source: Ambulatory Visit | Attending: Internal Medicine | Admitting: Internal Medicine

## 2010-12-26 DIAGNOSIS — I4892 Unspecified atrial flutter: Secondary | ICD-10-CM

## 2010-12-26 DIAGNOSIS — I422 Other hypertrophic cardiomyopathy: Secondary | ICD-10-CM | POA: Insufficient documentation

## 2010-12-26 DIAGNOSIS — Z8673 Personal history of transient ischemic attack (TIA), and cerebral infarction without residual deficits: Secondary | ICD-10-CM | POA: Insufficient documentation

## 2010-12-26 DIAGNOSIS — I10 Essential (primary) hypertension: Secondary | ICD-10-CM | POA: Insufficient documentation

## 2010-12-26 DIAGNOSIS — Z7901 Long term (current) use of anticoagulants: Secondary | ICD-10-CM | POA: Insufficient documentation

## 2010-12-27 DIAGNOSIS — I059 Rheumatic mitral valve disease, unspecified: Secondary | ICD-10-CM

## 2011-01-04 ENCOUNTER — Other Ambulatory Visit: Payer: Self-pay | Admitting: Cardiology

## 2011-01-16 NOTE — Op Note (Signed)
[name], [name] NO.:  [id number]  MEDICAL RECORD NO.:  [id number]  LOCATION:                                 FACILITY:  PHYSICIAN:  Hillis Range, MD       DATE OF BIRTH:  1941-03-10  DATE OF PROCEDURE:  12/26/2010 DATE OF DISCHARGE:                              OPERATIVE REPORT   SURGEON:  Hillis Range, MD  PREPROCEDURE DIAGNOSIS:  Atrial flutter.  POSTPROCEDURE DIAGNOSIS:  Counterclockwise isthmus-dependent right atrial flutter.  PROCEDURES: 1. Comprehensive electrophysiology study. 2. Coronary sinus pacing and recording. 3. Mapping of supraventricular tachycardia. 4. Radiofrequency ablation of supraventricular tachycardia.  INTRODUCTION:  [name] is a pleasant 70 year old gentleman with a history of mild asymmetric septal hypertrophy and hypertension who recently presented with atrial flutter.  He was initiated on anticoagulation with Pradaxa.  He presents today for EP study and radiofrequency ablation.  DESCRIPTION OF PROCEDURE:  Informed written consent was obtained, and the patient was brought to the Electrophysiology Lab in the fasting state.  He was adequately sedated with intravenous medications as outlined in the anesthesia report.  The patient's right groin was prepped and draped in the usual sterile fashion by the EP Lab staff. Using a percutaneous Seldinger technique, one 6, one 7, and one 8-French hemostasis sheaths were placed into the right common femoral vein.  A 7- [employer] decapolar coronary sinus catheter was introduced through the right common femoral vein and advanced into the coronary sinus for recording and pacing from this location.  A 6-French quadripolar Josephson catheter was introduced through the right common femoral vein and advanced into the right ventricle for recording and pacing.  This catheter was then pulled back to the His bundle location. The patient presented to the Electrophysiology Lab  in normal sinus rhythm.  His PR interval measured 281 msec with a QRS duration of 114 msec and a QT interval of 402 msec.  His AH interval measured 140 msec with an HV interval of 53 msec.  Ventricular pacing was performed which revealed VA dissociation at baseline.  Rapid atrial pacing was performed which revealed an AV Wenckebach cycle length of 530 msec.  There was no evidence of PR greater than RR.  With atrial pacing down to a cycle length of 250 msec, the patient developed typical-appearing atrial flutter with 2:1 AV conduction.  The atrial flutter cycle length measured 283 msec.  The coronary sinus activation sequence revealed proximal to distal activation suggestive of right atrial flutter.  A 7- [employer] dual decapolar catheter was introduced through the right common femoral vein and advanced around the tricuspid valve anulus.  This demonstrated a counterclockwise rotation around the tricuspid valve anulus.  Entrainment was performed from the left atrium which revealed a long post-pacing interval when compared to thetachycardia cycle length.  Entrainment was then performed from the cavotricuspid isthmus which revealed a post-pacing interval just slightly greater than the tachycardia cycle length.  The patient was therefore felt to have counterclockwise isthmus-dependent right atrial flutter.  A 3.5-mm [employer] Webster EZ-Steer cervical ablation catheter was introduced through the right common femoral vein and advanced into the right atrium.  Mapping  of the cavotricuspid isthmus was performed. This demonstrated a rather long and vertical isthmus.  A series of radiofrequency applications were delivered between the tricuspid valve anulus and the inferior vena cava along the usual cavotricuspid isthmus. A target temperature of 40 degrees was used with titration of power between 20 and 30 watts.  The tachycardia slowed and then terminated during ablation.  Additional  ablation was performed along the cavotricuspid isthmus.  Following ablation, complete bidirectional cavotricuspid isthmus block was achieved as evident by differential atrial pacing from the low lateral right atrium.  The patient was observed for a 30-minute period without return of conduction through the cavotricuspid isthmus.  Following ablation, rapid atrial pacing was performed down to a cycle length of 200 msec with no inducible arrhythmias observed.  Following ablation, the AH interval measured 144 msec with an HV interval of 55 msec.  The AV Wenckebach cycle length remained 530 msec.  There were no early apparent complications.  The procedure was therefore considered completed.  All catheters were removed and the sheaths were aspirated and flushed.  The sheaths were removed and hemostasis was assured.  There were no early apparent complications.  CONCLUSIONS: 1. Sinus rhythm upon presentation. 2. Easily inducible counterclockwise isthmus-dependent right atrial     flutter, successfully ablated along the usual cavotricuspid isthmus     with complete bidirectional cavotricuspid isthmus block achieved. 3. No inducible arrhythmias following ablation. 4. No early apparent complications.     Hillis Range, MD     JA/MEDQ  D:  12/26/2010  T:  12/26/2010  Job:  [id number]  cc:   Thomas C. Daleen Squibb, MD, [facility] Claude Manges, MD  Electronically Signed by Hillis Range MD on 01/16/2011 02:31:33 PM

## 2011-01-16 NOTE — Discharge Summary (Signed)
NAMELEVERETT, CAMPLIN NO.:  [id number]  MEDICAL RECORD NO.:  [id number]  LOCATION:  2008                         FACILITY:  [facility]  PHYSICIAN:  Hillis Range, MD       DATE OF BIRTH:  12/05/1940  DATE OF ADMISSION:  12/26/2010 DATE OF DISCHARGE:  12/27/2010                              DISCHARGE SUMMARY   PRIMARY CARE PHYSICIAN:  Georges Mouse II, MD  PRIMARY CARDIOLOGIST:  Jesse Sans. Daleen Squibb, MD, [facility]  ELECTROPHYSIOLOGIST:  Hillis Range, MD  PRIMARY DIAGNOSIS:  Atrial flutter status post ablation this admission.  SECONDARY DIAGNOSES: 1. History of transient ischemic attack in 2010. 2. Hypertrophic cardiomyopathy. 3. Diverticulosis. 4. Hemorrhoids. 5. Benign prostatic hypertrophy. 6. Hypertension. 7. Hyperlipidemia. 8. Status post basal cell carcinoma excision. 9. Status post bilateral hip arthroplasty. 10.Status post tonsillectomy. 11.Status post umbilical hernia repair.  ALLERGIES:  The patient is allergic to PENICILLIN.  PROCEDURES THIS ADMISSION:  Electrophysiology study and radiofrequency catheter ablation for atrial flutter on December 26, 2010, by Dr. Johney Frame. This demonstrated sinus rhythm upon presentation, easily inducible counterclockwise isthmus-dependent right atrial flutter that was successfully ablated with complete bidirectional isthmus block achieved. The patient had no inducible arrhythmias following ablation.  The patient had no early apparent complications.  BRIEF HISTORY OF PRESENT ILLNESS:  [name] is a 70 year old male who was referred to Dr. Johney Frame in the outpatient setting for treatment options for atrial flutter.  He was diagnosed with atrial flutter after presenting to the office with symptoms of a rapid heartbeat that occurred while biking.  Therapeutic strategies for atrial flutter including medication ablation were reviewed with the patient.  Risks, benefits, and alternatives to ablation were discussed with the  patient and wished to proceed.  HOSPITAL COURSE:  The patient was admitted on December 26, 2010, for planned ablation of atrial flutter.  This was carried out by Dr. Johney Frame with details mentioned above.  He was monitored on telemetry overnight, which demonstrated sinus rhythm with first-degree AV block.  The patient's groin incision was without hematoma or bruit.  Dr. Johney Frame examined the patient on December 27, 2010, and considered him stable for discharge to home.  FOLLOWUP APPOINTMENTS: 1. Dr. Johney Frame on January 29, 2011, at 11:30 am. 2. Dr. Daleen Squibb as scheduled. 3. Dr. Tanya Nones as scheduled.  DISCHARGE INSTRUCTIONS: 1. Increase activity slowly. 2. No driving for 4 days. 3. Follow low-sodium heart-healthy diet. 4. Keep groin incisions clean and dry.  DISCHARGE MEDICATIONS: 1. Lisinopril 10 mg 1 tablet daily. 2. Pradaxa 150 mg 1 tablet twice daily - the patient should continue     this medication until seen by Dr. Johney Frame. 3. Tylenol PM daily at bedtime as needed. 4. Vytorin 10/40 mg daily.  DISPOSITION:  The patient was seen and examined by Dr. Johney Frame on December 27, 2010, and considered stable for discharge.  Duration of discharge encounter 35 minutes.     Gypsy Balsam, RN,BSN   ______________________________ Hillis Range, MD    AS/MEDQ  D:  12/27/2010  T:  12/27/2010  Job:  [id number]  cc:   Thomas C. Daleen Squibb, MD, [facility] Claude Manges, MD  Electronically Signed by Gypsy Balsam RNBSN on  12/30/2010 10:17:57 AM Electronically Signed by Hillis Range MD on 01/16/2011 02:31:31 PM

## 2011-01-20 IMAGING — CR DG CHEST 1V PORT
1 series · 1 of 1 positions shown · non-contrast
Comparison: 05/28/2007

CLINICAL DATA: Shortness of breath.

PORTABLE CHEST - 1 VIEW

[view not recorded]
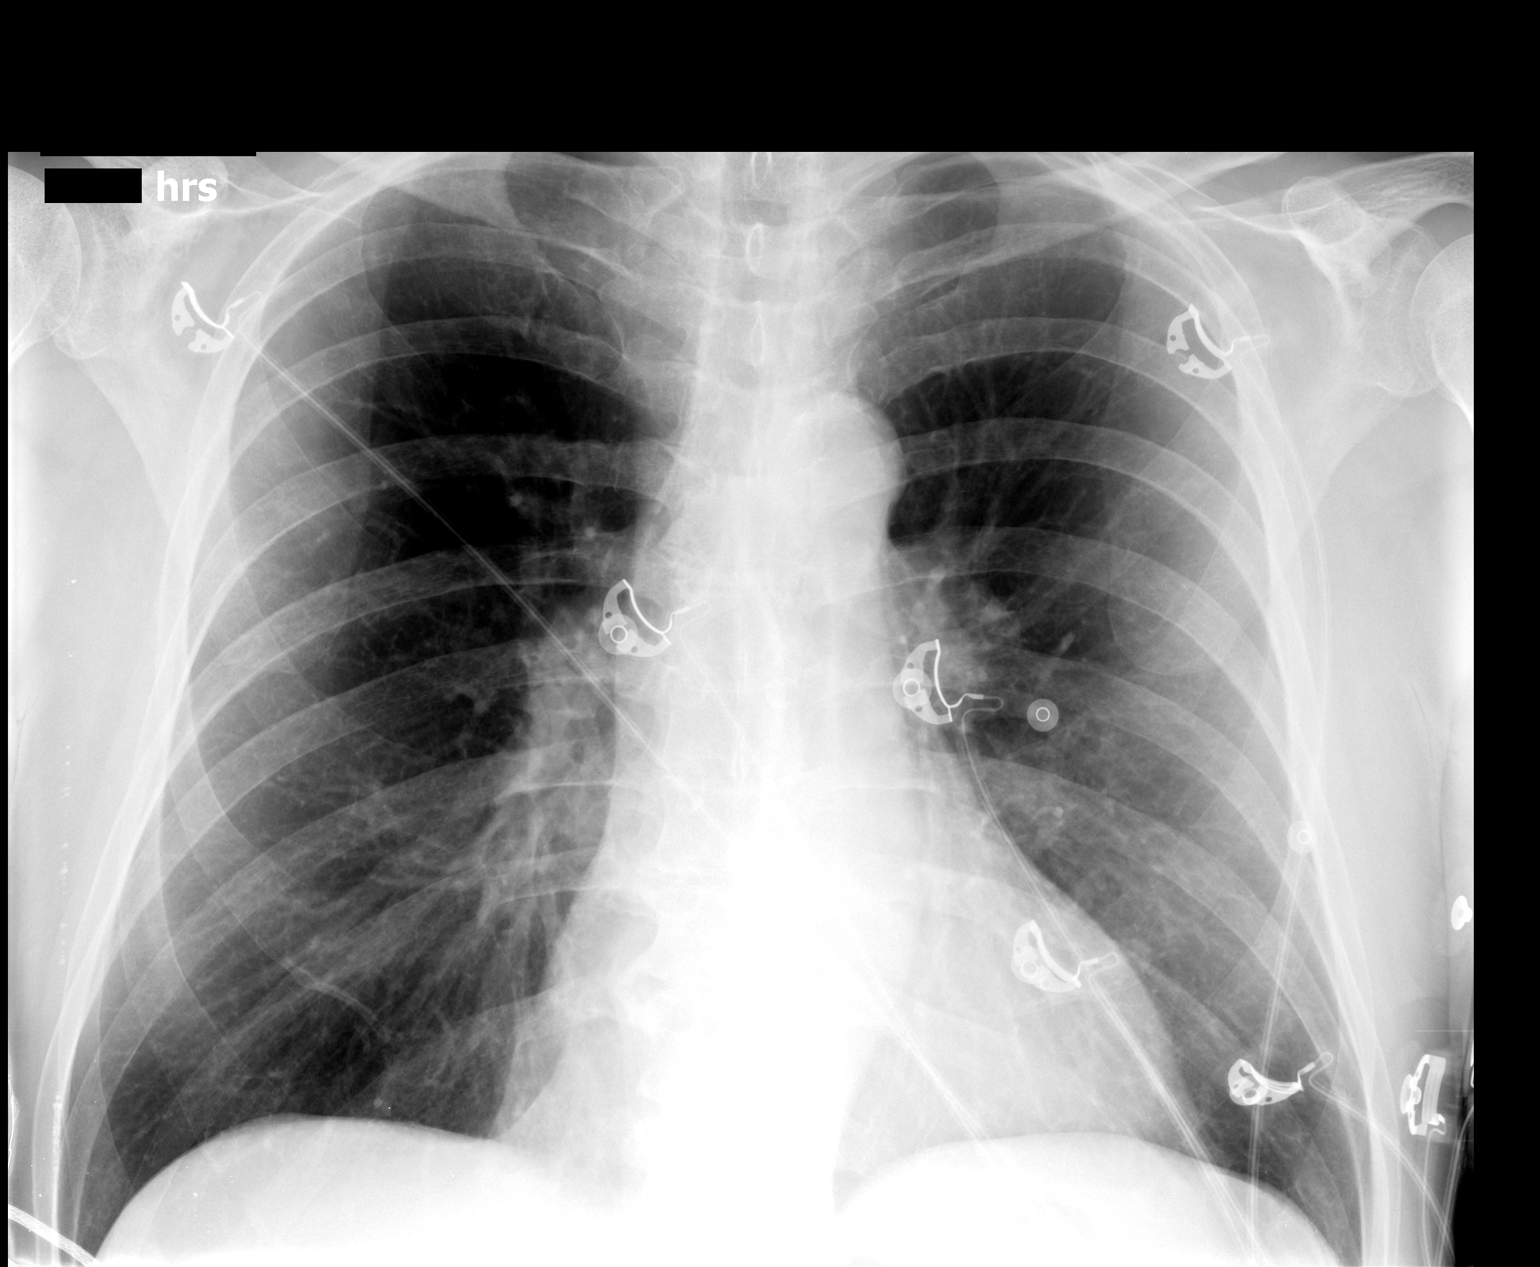

[1 of 1 positions shown; findings below may reference images not displayed]

FINDINGS: The cardiomediastinal silhouette is unremarkable.
The lungs are clear.
There is no evidence of focal airspace disease, pulmonary edema,
pleural effusion, or pneumothorax.
No acute bony abnormalities are identified.
IMPRESSION: No evidence of acute cardiopulmonary disease.

## 2011-01-29 ENCOUNTER — Encounter: Payer: Self-pay | Admitting: Internal Medicine

## 2011-01-29 ENCOUNTER — Ambulatory Visit (INDEPENDENT_AMBULATORY_CARE_PROVIDER_SITE_OTHER): Payer: Medicare Other | Admitting: Internal Medicine

## 2011-01-29 VITALS — BP 112/70 | HR 59 | Ht 70.0 in | Wt 182.0 lb

## 2011-01-29 DIAGNOSIS — I4892 Unspecified atrial flutter: Secondary | ICD-10-CM

## 2011-01-29 NOTE — Progress Notes (Signed)
The patient presents today for routine electrophysiology followup.  Since his recent atrial flutter ablation, the patient reports doing very well.  He denies procedure related complications.  He remains in sinus rhythm.  Today, he denies symptoms of palpitations, chest pain, shortness of breath, orthopnea, PND, lower extremity edema, dizziness, presyncope, syncope, or neurologic sequela.  The patient feels that he is tolerating medications without difficulties and is otherwise without complaint today.   Past Medical History  Diagnosis Date  . Hypertrophic cardiomyopathy   . Diverticulosis   . Hemorrhoids   . BPH (benign prostatic hyperplasia)   . TIA (transient ischemic attack)   . Hypertension   . Hyperlipidemia   . Atrial flutter     s/p CTI ablation by Dr Johney Frame 10/12   Past Surgical History  Procedure Date  . Basal cell carcinoma excision   . Total hip arthroplasty     right  . Total hip arthroplasty     left  . Tonsillectomy   . Umbilical hernia repair   . Atrial ablation surgery     CTI ablation for atrial flutter by Dr Johney Frame 10/12    Current Outpatient Prescriptions  Medication Sig Dispense Refill  . AMBULATORY NON FORMULARY MEDICATION Multivitamin energy drink Drink on every day       . dabigatran (PRADAXA) 150 MG CAPS Take 1 capsule (150 mg total) by mouth every 12 (twelve) hours.      . diphenhydramine-acetaminophen (TYLENOL PM) 25-500 MG TABS Take 1 tablet by mouth at bedtime as needed.        Marland Kitchen lisinopril (PRINIVIL,ZESTRIL) 10 MG tablet TAKE 1 TABLET DAILY  90 tablet  2  . verapamil (CALAN) 40 MG tablet Take 1 tablet (40 mg total) by mouth as needed.  30 tablet  2  . VYTORIN 10-40 MG per tablet TAKE 1 TABLET DAILY  90 tablet  0  . aspirin 81 MG tablet Take 81 mg by mouth daily. NOT TAKING ( ON HOLD )        Allergies  Allergen Reactions  . Penicillins     History   Social History  . Marital Status: Married    Spouse Name: N/A    Number of Children: 2  .  Years of Education: N/A   Occupational History  . retired    Social History Main Topics  . Smoking status: Former Smoker -- 2.0 packs/day for 10 years    Types: Cigarettes    Quit date: 03/17/1970  . Smokeless tobacco: Never Used  . Alcohol Use: No     quit 16 years ago  . Drug Use: No  . Sexually Active: Not on file   Other Topics Concern  . Not on file   Social History Narrative   Lives in [place] with spouse.  2 Grown children.  Retired from Doctor, general practice    Family History  Problem Relation Age of Onset  . Colon polyps Brother   . Tuberculosis Brother     and father  . Breast cancer Mother    Physical Exam: Filed Vitals:   01/29/11 1138  BP: 112/70  Pulse: 59  Height: 5\' 10"  (1.778 m)  Weight: 182 lb (82.555 kg)    GEN- The patient is well appearing, alert and oriented x 3 today.   Head- normocephalic, atraumatic Eyes-  Sclera clear, conjunctiva pink Ears- hearing intact Oropharynx- clear Neck- supple, no JVP Lymph- no cervical lymphadenopathy Lungs- Clear to ausculation bilaterally, normal work of breathing Heart-  Regular rate and rhythm, no murmurs, rubs or gallops, PMI not laterally displaced GI- soft, NT, ND, + BS Extremities- no clubbing, cyanosis, or edema  ekg today reveals sinus rhythm 59 bpm, first degree AV block PR 246,  Inferior and anterior infarction pattern which is unchanged from 07/28/10  Assessment and Plan:

## 2011-01-29 NOTE — Assessment & Plan Note (Signed)
Doing very well s/p ablation without recurrence Stop pradaxa today, resume ASA  Follow-up with Dr Daleen Squibb for routine cardiology care and I will see him as needed.

## 2011-05-29 ENCOUNTER — Other Ambulatory Visit: Payer: Self-pay | Admitting: *Deleted

## 2011-05-29 MED ORDER — EZETIMIBE-SIMVASTATIN 10-40 MG PO TABS: 1.0000 | ORAL_TABLET | Freq: Every day | ORAL | Status: DC

## 2011-11-10 ENCOUNTER — Ambulatory Visit (INDEPENDENT_AMBULATORY_CARE_PROVIDER_SITE_OTHER): Payer: Medicare Other | Admitting: Cardiology

## 2011-11-10 ENCOUNTER — Encounter: Payer: Self-pay | Admitting: Cardiology

## 2011-11-10 VITALS — BP 128/70 | HR 63 | Ht 70.0 in | Wt 174.0 lb

## 2011-11-10 DIAGNOSIS — Z8673 Personal history of transient ischemic attack (TIA), and cerebral infarction without residual deficits: Secondary | ICD-10-CM

## 2011-11-10 DIAGNOSIS — I422 Other hypertrophic cardiomyopathy: Secondary | ICD-10-CM

## 2011-11-10 DIAGNOSIS — Z7901 Long term (current) use of anticoagulants: Secondary | ICD-10-CM

## 2011-11-10 DIAGNOSIS — I4892 Unspecified atrial flutter: Secondary | ICD-10-CM

## 2011-11-10 DIAGNOSIS — I10 Essential (primary) hypertension: Secondary | ICD-10-CM

## 2011-11-10 NOTE — Assessment & Plan Note (Signed)
Asymptomatic and stable 

## 2011-11-10 NOTE — Progress Notes (Signed)
HPI [name]  returns today for evaluation and management of his history of hypertrophic nonobstructive cardiomyopathy, hypertension, hyperlipidemia, history of syncope most likely vasovagal or orthostatic from dehydration, and a history of a flutter status post ablation December of 2012.  He's done remarkably well. He lost about 15 pounds on admission trip and his blood pressures are running in the 90s. He cut his lisinopril from 10 mg to 5 mg. His pressures are now running about 100/70. He denies any symptoms with that.  He denies any increased dyspnea on exertion, presyncope or syncope. He is very compliant with his medications. He is back to exercising regularly on his bike.  Past Medical History  Diagnosis Date  . Hypertrophic cardiomyopathy   . Diverticulosis   . Hemorrhoids   . BPH (benign prostatic hyperplasia)   . TIA (transient ischemic attack)   . Hypertension   . Hyperlipidemia   . Atrial flutter     s/p CTI ablation by Dr Johney Frame 10/12    Current Outpatient Prescriptions  Medication Sig Dispense Refill  . AMBULATORY NON FORMULARY MEDICATION Multivitamin energy drink Drink on every day       . aspirin 81 MG tablet Take 81 mg by mouth daily. NOT TAKING ( ON HOLD )      . diphenhydramine-acetaminophen (TYLENOL PM) 25-500 MG TABS Take 1 tablet by mouth at bedtime as needed.        . ezetimibe-simvastatin (VYTORIN) 10-40 MG per tablet Take 1 tablet by mouth at bedtime.  90 tablet  0  . lisinopril (PRINIVIL,ZESTRIL) 10 MG tablet TAKE 1 TABLET DAILY  90 tablet  2    Allergies  Allergen Reactions  . Penicillins     Family History  Problem Relation Age of Onset  . Colon polyps Brother   . Tuberculosis Brother     and father  . Breast cancer Mother     History   Social History  . Marital Status: Married    Spouse Name: N/A    Number of Children: 2  . Years of Education: N/A   Occupational History  . retired    Social History Main Topics  . Smoking status:  Former Smoker -- 2.0 packs/day for 10 years    Types: Cigarettes    Quit date: 03/17/1970  . Smokeless tobacco: Never Used  . Alcohol Use: No     quit 16 years ago  . Drug Use: No  . Sexually Active: Not on file   Other Topics Concern  . Not on file   Social History Narrative   Lives in [place] with spouse.  2 Grown children.  Retired from Doctor, general practice    ROS ALL NEGATIVE EXCEPT THOSE NOTED IN HPI  PE  General Appearance: well developed, well nourished in no acute distress HEENT: symmetrical face, PERRLA, good dentition  Neck: no JVD, thyromegaly, or adenopathy, trachea midline Chest: symmetric without deformity Cardiac: PMI non-displaced, RRR, normal S1, S2, soft systolic murmur Lung: clear to ausculation and percussion Vascular: all pulses full without bruits  Abdominal: nondistended, nontender, good bowel sounds, no HSM, no bruits Extremities: no cyanosis, clubbing or edema, no sign of DVT, no varicosities  Skin: normal color, no rashes Neuro: alert and oriented x 3, non-focal Pysch: normal affect  EKG Normal sinus rhythm with a stable first-degree block, left axis deviation, poor R waves in the lateral leads, no change BMET    Component Value Date/Time   NA 139 12/24/2010 0904   K  4.7 12/24/2010 0904   CL 105 12/24/2010 0904   CO2 26 12/24/2010 0904   GLUCOSE 98 12/24/2010 0904   BUN 24* 12/24/2010 0904   CREATININE 1.2 12/24/2010 0904   CALCIUM 8.9 12/24/2010 0904   GFRNONAA 59* 05/01/2009 0528   GFRAA  Value: >60        The eGFR has been calculated using the MDRD equation. This calculation has not been validated in all clinical situations. eGFR's persistently <60 mL/min signify possible Chronic Kidney Disease. 05/01/2009 0528    Lipid Panel     Component Value Date/Time   CHOL  Value: 124        ATP III CLASSIFICATION:  <200     mg/dL   Desirable  161-096  mg/dL   Borderline High  >=045    mg/dL   High        40/11/8117 2158   TRIG 56 01/15/2009 2158     HDL 57 01/15/2009 2158   CHOLHDL 2.2 01/15/2009 2158   VLDL 11 01/15/2009 2158   LDLCALC  Value: 56        Total Cholesterol/HDL:CHD Risk Coronary Heart Disease Risk Table                     Men   Women  1/2 Average Risk   3.4   3.3  Average Risk       5.0   4.4  2 X Average Risk   9.6   7.1  3 X Average Risk  23.4   11.0        Use the calculated Patient Ratio above and the CHD Risk Table to determine the patient's CHD Risk.        ATP III CLASSIFICATION (LDL):  <100     mg/dL   Optimal  147-829  mg/dL   Near or Above                    Optimal  130-159  mg/dL   Borderline  562-130  mg/dL   High  >865     mg/dL   Very High 78/06/6960 9528    CBC    Component Value Date/Time   WBC 9.1 12/24/2010 0904   RBC 4.59 12/24/2010 0904   HGB 13.8 12/24/2010 0904   HCT 42.3 12/24/2010 0904   PLT 173.0 12/24/2010 0904   MCV 92.2 12/24/2010 0904   MCHC 32.6 12/24/2010 0904   RDW 12.8 12/24/2010 0904   LYMPHSABS 2.2 12/24/2010 0904   MONOABS 1.0 12/24/2010 0904   EOSABS 0.2 12/24/2010 0904   BASOSABS 0.0 12/24/2010 0904

## 2011-11-10 NOTE — Assessment & Plan Note (Signed)
No recurrence since  ablation. Continue aspirin.

## 2011-11-10 NOTE — Assessment & Plan Note (Signed)
Decrease in lisinopril noted. He'll continue with 5 mg a day and monitor his pressures. I reviewed his blood pressure Journal with him.

## 2011-11-10 NOTE — Patient Instructions (Addendum)
Your physician recommends that you continue on your current medications as directed. Please refer to the Current Medication list given to you today.  Your physician wants you to follow-up in: 1 year. You will receive a reminder letter in the mail two months in advance. If you don't receive a letter, please call our office to schedule the follow-up appointment.  

## 2011-11-18 ENCOUNTER — Ambulatory Visit: Payer: Medicare Other | Admitting: Cardiology

## 2011-11-18 ENCOUNTER — Encounter: Payer: Self-pay | Admitting: Cardiology

## 2011-11-18 ENCOUNTER — Other Ambulatory Visit: Payer: Self-pay | Admitting: *Deleted

## 2011-11-18 ENCOUNTER — Encounter (INDEPENDENT_AMBULATORY_CARE_PROVIDER_SITE_OTHER): Payer: Medicare Other | Admitting: Cardiology

## 2011-11-18 ENCOUNTER — Telehealth: Payer: Self-pay | Admitting: Cardiology

## 2011-11-18 ENCOUNTER — Ambulatory Visit (INDEPENDENT_AMBULATORY_CARE_PROVIDER_SITE_OTHER): Payer: Medicare Other | Admitting: Cardiology

## 2011-11-18 VITALS — BP 100/70 | HR 90 | Resp 16

## 2011-11-18 VITALS — BP 110/70 | HR 90 | Resp 20 | Ht 70.0 in | Wt 171.2 lb

## 2011-11-18 DIAGNOSIS — I4891 Unspecified atrial fibrillation: Secondary | ICD-10-CM

## 2011-11-18 DIAGNOSIS — I4892 Unspecified atrial flutter: Secondary | ICD-10-CM

## 2011-11-18 MED ORDER — DABIGATRAN ETEXILATE MESYLATE 150 MG PO CAPS: 150.0000 mg | ORAL_CAPSULE | Freq: Two times a day (BID) | ORAL | Status: DC

## 2011-11-18 MED ORDER — VERAPAMIL HCL 40 MG PO TABS: 40.0000 mg | ORAL_TABLET | Freq: Three times a day (TID) | ORAL | Status: DC

## 2011-11-18 NOTE — Telephone Encounter (Signed)
New problem  Patient calling c/o afib for couple of days.

## 2011-11-18 NOTE — Progress Notes (Signed)
HPI [name] comes in today as an add-on for increased shortness of breath with exertion and also heart rate resting in the 90's when he  Checked  his blood pressure. I just saw him last week and he was doing very well.  He notes increased shortness of breath on Saturday. He actually rode his bike on Friday without any problems.  EKG shows recurrent atrial flutter with a rate of 90 beats per minute.  Past Medical History  Diagnosis Date  . Hypertrophic cardiomyopathy   . Diverticulosis   . Hemorrhoids   . BPH (benign prostatic hyperplasia)   . TIA (transient ischemic attack)   . Hypertension   . Hyperlipidemia   . Atrial flutter     s/p CTI ablation by Dr Johney Frame 10/12    Current Outpatient Prescriptions  Medication Sig Dispense Refill  . aspirin 81 MG tablet Take 81 mg by mouth daily. NOT TAKING ( ON HOLD )      . diphenhydramine-acetaminophen (TYLENOL PM) 25-500 MG TABS Take 1 tablet by mouth at bedtime as needed.        . ezetimibe-simvastatin (VYTORIN) 10-40 MG per tablet Take 1 tablet by mouth at bedtime.  90 tablet  0  . lisinopril (PRINIVIL,ZESTRIL) 10 MG tablet       . DISCONTD: lisinopril (PRINIVIL,ZESTRIL) 10 MG tablet TAKE 1 TABLET DAILY  90 tablet  2  . dabigatran (PRADAXA) 150 MG CAPS Take 1 capsule (150 mg total) by mouth every 12 (twelve) hours.  60 capsule  2  . verapamil (CALAN) 40 MG tablet Take 1 tablet (40 mg total) by mouth 3 (three) times daily.  30 tablet  3    Allergies  Allergen Reactions  . Penicillins     Family History  Problem Relation Age of Onset  . Colon polyps Brother   . Tuberculosis Brother     and father  . Breast cancer Mother     History   Social History  . Marital Status: Married    Spouse Name: N/A    Number of Children: 2  . Years of Education: N/A   Occupational History  . retired    Social History Main Topics  . Smoking status: Former Smoker -- 2.0 packs/day for 10 years    Types: Cigarettes    Quit date: 03/17/1970  .  Smokeless tobacco: Never Used  . Alcohol Use: No     quit 16 years ago  . Drug Use: No  . Sexually Active: Not on file   Other Topics Concern  . Not on file   Social History Narrative   Lives in [place] with spouse.  2 Grown children.  Retired from Doctor, general practice    ROS ALL NEGATIVE EXCEPT THOSE NOTED IN HPI  PE  General Appearance: well developed, well nourished in no acute distress HEENT: symmetrical face, PERRLA, good dentition  Neck: no JVD, thyromegaly, or adenopathy, trachea midline Chest: symmetric without deformity Cardiac: PMI non-displaced, RRR, normal S1, S2, no gallop or murmur Lung: clear to ausculation and percussion Vascular: all pulses full without bruits  Abdominal: nondistended, nontender, good bowel sounds, no HSM, no bruits Extremities: no cyanosis, clubbing or edema, no sign of DVT, no varicosities  Skin: normal color, no rashes Neuro: alert and oriented x 3, non-focal Pysch: normal affect  EKG A flutter with a rate of 90 beats per minute. BMET    Component Value Date/Time   NA 139 12/24/2010 0904   K 4.7 12/24/2010 0904  CL 105 12/24/2010 0904   CO2 26 12/24/2010 0904   GLUCOSE 98 12/24/2010 0904   BUN 24* 12/24/2010 0904   CREATININE 1.2 12/24/2010 0904   CALCIUM 8.9 12/24/2010 0904   GFRNONAA 59* 05/01/2009 0528   GFRAA  Value: >60        The eGFR has been calculated using the MDRD equation. This calculation has not been validated in all clinical situations. eGFR's persistently <60 mL/min signify possible Chronic Kidney Disease. 05/01/2009 0528    Lipid Panel     Component Value Date/Time   CHOL  Value: 124        ATP III CLASSIFICATION:  <200     mg/dL   Desirable  161-096  mg/dL   Borderline High  >=045    mg/dL   High        40/11/8117 2158   TRIG 56 01/15/2009 2158   HDL 57 01/15/2009 2158   CHOLHDL 2.2 01/15/2009 2158   VLDL 11 01/15/2009 2158   LDLCALC  Value: 56        Total Cholesterol/HDL:CHD Risk Coronary Heart Disease Risk  Table                     Men   Women  1/2 Average Risk   3.4   3.3  Average Risk       5.0   4.4  2 X Average Risk   9.6   7.1  3 X Average Risk  23.4   11.0        Use the calculated Patient Ratio above and the CHD Risk Table to determine the patient's CHD Risk.        ATP III CLASSIFICATION (LDL):  <100     mg/dL   Optimal  147-829  mg/dL   Near or Above                    Optimal  130-159  mg/dL   Borderline  562-130  mg/dL   High  >865     mg/dL   Very High 78/06/6960 9528    CBC    Component Value Date/Time   WBC 9.1 12/24/2010 0904   RBC 4.59 12/24/2010 0904   HGB 13.8 12/24/2010 0904   HCT 42.3 12/24/2010 0904   PLT 173.0 12/24/2010 0904   MCV 92.2 12/24/2010 0904   MCHC 32.6 12/24/2010 0904   RDW 12.8 12/24/2010 0904   LYMPHSABS 2.2 12/24/2010 0904   MONOABS 1.0 12/24/2010 0904   EOSABS 0.2 12/24/2010 0904   BASOSABS 0.0 12/24/2010 0904

## 2011-11-18 NOTE — Patient Instructions (Addendum)
Your physician recommends that you schedule a follow-up appointment in: 11/26/11 with Dr Johney Frame at 12:00   Your physician has recommended you make the following change in your medication:  1) restart Calan 40mg  every 6 hoursas needed for increased heartrates 2) restart pradaxa 150mg  twice daily

## 2011-11-18 NOTE — Telephone Encounter (Addendum)
Called patient thinks he is back in atrial flutter. Has has an ablation in the past. Discussed with Dr.Wall and he advised that patient have a nurse visit for an EKG and BP check. Advised patient to come over now for a nurse visit.

## 2011-11-18 NOTE — Progress Notes (Signed)
Pt here for EKG and BP check--per dr wall--pt is in a-flutter --dr wall wants pt sched to see dr allred --bp=100/70--p=90--resp=20--pt turned over to k lanier to sched appoint with dr Johney Frame

## 2011-11-18 NOTE — Assessment & Plan Note (Signed)
Unfortunately, he developed recurrent A. flutter. I have started him on Pradaxa and prn Calan. I will arrange for him to see Dr. Johney Frame on September 11. He's been advised to not overdo it in the interim. Over an hour was spent arranging all this.

## 2011-11-26 ENCOUNTER — Ambulatory Visit (INDEPENDENT_AMBULATORY_CARE_PROVIDER_SITE_OTHER): Payer: Medicare Other | Admitting: Internal Medicine

## 2011-11-26 ENCOUNTER — Encounter: Payer: Self-pay | Admitting: Internal Medicine

## 2011-11-26 ENCOUNTER — Encounter: Payer: Self-pay | Admitting: *Deleted

## 2011-11-26 VITALS — BP 122/64 | HR 56 | Resp 18 | Ht 70.0 in | Wt 172.8 lb

## 2011-11-26 DIAGNOSIS — I10 Essential (primary) hypertension: Secondary | ICD-10-CM

## 2011-11-26 DIAGNOSIS — I422 Other hypertrophic cardiomyopathy: Secondary | ICD-10-CM

## 2011-11-26 DIAGNOSIS — I4892 Unspecified atrial flutter: Secondary | ICD-10-CM

## 2011-11-26 NOTE — Patient Instructions (Addendum)
Your physician has recommended that you have an ablation. Catheter ablation is a medical procedure used to treat some cardiac arrhythmias (irregular heartbeats). During catheter ablation, a long, thin, flexible tube is put into a blood vessel in your groin (upper thigh), or neck. This tube is called an ablation catheter. It is then guided to your heart through the blood vessel. Radio frequency waves destroy small areas of heart tissue where abnormal heartbeats may cause an arrhythmia to start. Please see the instruction sheet given to you today.  Your physician recommends that you continue on your current medications as directed. Please refer to the Current Medication list given to you today.  

## 2011-11-26 NOTE — Progress Notes (Signed)
  PCP:  [name],Anthony TOM, MD Primary Cardiologist::  Dr Wall  The patient presents today for routine electrophysiology followup.  Since last being seen in our clinic, the patient reports doing very well.  He recently presented to follow-up with Dr Wall and was found to have recurrent typical appearing atrial flutter.  The patient was started on pradaxa and now returns to our clinic.  He notices that during exercise that his heart rate is elevated.  Today, he denies symptoms of chest pain, shortness of breath, orthopnea, PND, lower extremity edema, dizziness, presyncope, syncope, or neurologic sequela.  The patient feels that he is tolerating medications without difficulties and is otherwise without complaint today.   Past Medical History  Diagnosis Date  . Hypertrophic cardiomyopathy   . Diverticulosis   . Hemorrhoids   . BPH (benign prostatic hyperplasia)   . TIA (transient ischemic attack)   . Hypertension   . Hyperlipidemia   . Atrial flutter     s/p CTI ablation by Dr Wallace Cogliano 10/12   Past Surgical History  Procedure Date  . Basal cell carcinoma excision   . Total hip arthroplasty     right  . Total hip arthroplasty     left  . Tonsillectomy   . Umbilical hernia repair   . Atrial ablation surgery 10/12    CTI ablation for atrial flutter by Dr Yanette Tripoli 10/12    Current Outpatient Prescriptions  Medication Sig Dispense Refill  . aspirin 81 MG tablet Take 81 mg by mouth daily. NOT TAKING ( ON HOLD )      . dabigatran (PRADAXA) 150 MG CAPS Take 1 capsule (150 mg total) by mouth every 12 (twelve) hours.  60 capsule  2  . diphenhydramine-acetaminophen (TYLENOL PM) 25-500 MG TABS Take 1 tablet by mouth at bedtime as needed.        . ezetimibe-simvastatin (VYTORIN) 10-40 MG per tablet Take 1 tablet by mouth at bedtime.  90 tablet  0  . lisinopril (PRINIVIL,ZESTRIL) 10 MG tablet       . verapamil (CALAN) 40 MG tablet Take 1 tablet (40 mg total) by mouth 3 (three) times daily.  30  tablet  3    Allergies  Allergen Reactions  . Penicillins     History   Social History  . Marital Status: Married    Spouse Name: N/A    Number of Children: 2  . Years of Education: N/A   Occupational History  . retired    Social History Main Topics  . Smoking status: Former Smoker -- 2.0 packs/day for 10 years    Types: Cigarettes    Quit date: 03/17/1970  . Smokeless tobacco: Never Used  . Alcohol Use: No     quit 16 years ago  . Drug Use: No  . Sexually Active: Not on file   Other Topics Concern  . Not on file   Social History Narrative   Lives in [place] with spouse.  2 Grown children.  Retired from textile sales/ marketing    Family History  Problem Relation Age of Onset  . Colon polyps Brother   . Tuberculosis Brother     and father  . Breast cancer Mother     ROS-  All systems are reviewed and are negative except as outlined in the HPI above   Physical Exam: Filed Vitals:   11/26/11 1222  BP: 122/64  Pulse: 56  Resp: 18  Height: 5' 10" (1.778 m)  Weight: 172   lb 12.8 oz (78.382 kg)  SpO2: 91%    GEN- The patient is well appearing, alert and oriented x 3 today.   Head- normocephalic, atraumatic Eyes-  Sclera clear, conjunctiva pink Ears- hearing intact Oropharynx- clear Neck- supple, no JVP Lymph- no cervical lymphadenopathy Lungs- Clear to ausculation bilaterally, normal work of breathing Heart- Regular rate and rhythm, no murmurs, rubs or gallops, PMI not laterally displaced GI- soft, NT, ND, + BS Extremities- no clubbing, cyanosis, or edema MS- no significant deformity or atrophy Skin- no rash or lesion Psych- euthymic mood, full affect Neuro- strength and sensation are intact  EKG today reveals sinus rhythm 56 bpm, PR 248, QRS 98, Qtc 414, LAD EKG 11/18/11 reveals typical appearing atrial flutter with atrial flutter CL 340msec and V rate 90 bpm  Assessment and Plan: 1. Recurrent atrial flutter The patient has recurrent  symptomatic typical appearing atria flutter.  He has been restarted on pradaxa by Dr Wall. Therapeutic strategies for atrial flutter including medicine and ablation were discussed in detail with the patient today. Risk, benefits, and alternatives to EP study and radiofrequency ablation were also discussed in detail today. These risks include but are not limited to stroke, bleeding, vascular damage, tamponade, perforation, damage to the heart and other structures, AV block requiring pacemaker, worsening renal function, and death. The patient understands these risk and wishes to proceed.  We will therefore proceed with catheter ablation at the next available time.   2. Hypertrophic Cardiomyopathy Stable at this time No changes  3. HTN Stable No change required today  

## 2011-11-28 ENCOUNTER — Encounter (HOSPITAL_COMMUNITY): Payer: Self-pay | Admitting: Pharmacist

## 2011-12-08 ENCOUNTER — Other Ambulatory Visit (INDEPENDENT_AMBULATORY_CARE_PROVIDER_SITE_OTHER): Payer: Medicare Other

## 2011-12-08 DIAGNOSIS — I4892 Unspecified atrial flutter: Secondary | ICD-10-CM

## 2011-12-08 LAB — CBC WITH DIFFERENTIAL/PLATELET
Basophils Absolute: 0 10*3/uL (ref 0.0–0.1)
Basophils Relative: 0.5 % (ref 0.0–3.0)
Eosinophils Absolute: 0.1 10*3/uL (ref 0.0–0.7)
Eosinophils Relative: 2.3 % (ref 0.0–5.0)
HCT: 41.3 % (ref 39.0–52.0)
Hemoglobin: 13.6 g/dL (ref 13.0–17.0)
Lymphocytes Relative: 33.4 % (ref 12.0–46.0)
Lymphs Abs: 2.1 10*3/uL (ref 0.7–4.0)
MCHC: 32.8 g/dL (ref 30.0–36.0)
MCV: 92.4 fl (ref 78.0–100.0)
Monocytes Absolute: 0.5 10*3/uL (ref 0.1–1.0)
Monocytes Relative: 8.5 % (ref 3.0–12.0)
Neutro Abs: 3.4 10*3/uL (ref 1.4–7.7)
Neutrophils Relative %: 55.3 % (ref 43.0–77.0)
Platelets: 179 10*3/uL (ref 150.0–400.0)
RBC: 4.48 Mil/uL (ref 4.22–5.81)
RDW: 12.8 % (ref 11.5–14.6)
WBC: 6.2 10*3/uL (ref 4.5–10.5)

## 2011-12-08 LAB — PROTIME-INR
INR: 1.2 ratio — ABNORMAL HIGH (ref 0.8–1.0)
Prothrombin Time: 13.4 s — ABNORMAL HIGH (ref 10.2–12.4)

## 2011-12-09 LAB — BASIC METABOLIC PANEL
BUN: 24 mg/dL — ABNORMAL HIGH (ref 6–23)
CO2: 25 mEq/L (ref 19–32)
Calcium: 9 mg/dL (ref 8.4–10.5)
Chloride: 108 mEq/L (ref 96–112)
Creatinine, Ser: 1.2 mg/dL (ref 0.4–1.5)
GFR: 62.71 mL/min (ref >60.00)
Glucose, Bld: 80 mg/dL (ref 70–99)
Potassium: 4.4 mEq/L (ref 3.5–5.1)
Sodium: 139 mEq/L (ref 135–145)

## 2011-12-10 ENCOUNTER — Other Ambulatory Visit: Payer: Self-pay | Admitting: *Deleted

## 2011-12-11 ENCOUNTER — Ambulatory Visit (HOSPITAL_COMMUNITY): Payer: Medicare Other | Admitting: Anesthesiology

## 2011-12-11 ENCOUNTER — Encounter (HOSPITAL_COMMUNITY): Payer: Self-pay | Admitting: Anesthesiology

## 2011-12-11 ENCOUNTER — Ambulatory Visit (HOSPITAL_COMMUNITY)
Admission: RE | Admit: 2011-12-11 | Discharge: 2011-12-11 | Disposition: A | Discharge status: Home / Self Care | Payer: Medicare Other | Source: Ambulatory Visit | Attending: Internal Medicine | Admitting: Internal Medicine

## 2011-12-11 ENCOUNTER — Encounter (HOSPITAL_COMMUNITY)
Admission: RE | Disposition: A | Discharge status: Home / Self Care | Payer: Self-pay | Source: Ambulatory Visit | Attending: Internal Medicine

## 2011-12-11 DIAGNOSIS — I10 Essential (primary) hypertension: Secondary | ICD-10-CM | POA: Insufficient documentation

## 2011-12-11 DIAGNOSIS — I4892 Unspecified atrial flutter: Secondary | ICD-10-CM

## 2011-12-11 DIAGNOSIS — I422 Other hypertrophic cardiomyopathy: Secondary | ICD-10-CM | POA: Diagnosis present

## 2011-12-11 HISTORY — PX: ATRIAL FLUTTER ABLATION: SHX5733

## 2011-12-11 HISTORY — DX: Basal cell carcinoma of skin of other parts of face: C44.319

## 2011-12-11 SURGERY — ATRIAL FLUTTER ABLATION
Anesthesia: Monitor Anesthesia Care

## 2011-12-11 MED ORDER — LISINOPRIL 5 MG PO TABS: 5.0000 mg | ORAL_TABLET | Freq: Every day | ORAL | Status: DC

## 2011-12-11 MED ORDER — HYDROXYUREA 500 MG PO CAPS
ORAL_CAPSULE | ORAL | Status: AC
  Filled 2011-12-11: qty 1

## 2011-12-11 MED ORDER — PROPOFOL INFUSION 10 MG/ML OPTIME
INTRAVENOUS | Status: DC | PRN
  Administered 2011-12-11: 75 ug/kg/min via INTRAVENOUS

## 2011-12-11 MED ORDER — EZETIMIBE-SIMVASTATIN 10-40 MG PO TABS
1.0000 | ORAL_TABLET | Freq: Every day | ORAL | Status: DC
  Filled 2011-12-11: qty 1

## 2011-12-11 MED ORDER — SODIUM CHLORIDE 0.9 % IJ SOLN: 3.0000 mL | INTRAMUSCULAR | Status: DC | PRN

## 2011-12-11 MED ORDER — DEXTROSE 5 % IV SOLN
INTRAVENOUS | Status: AC
  Filled 2011-12-11: qty 250

## 2011-12-11 MED ORDER — HYDROMORPHONE HCL PF 1 MG/ML IJ SOLN: 0.2500 mg | INTRAMUSCULAR | Status: DC | PRN

## 2011-12-11 MED ORDER — SODIUM CHLORIDE 0.9 % IJ SOLN: 3.0000 mL | Freq: Two times a day (BID) | INTRAMUSCULAR | Status: DC

## 2011-12-11 MED ORDER — ISOPROTERENOL HCL 0.2 MG/ML IJ SOLN
1000.0000 ug | INTRAVENOUS | Status: DC | PRN
  Administered 2011-12-11: 2 ug/min via INTRAVENOUS

## 2011-12-11 MED ORDER — BUPIVACAINE HCL (PF) 0.25 % IJ SOLN
INTRAMUSCULAR | Status: AC
  Filled 2011-12-11: qty 60

## 2011-12-11 MED ORDER — HYDROCODONE-ACETAMINOPHEN 5-325 MG PO TABS: 1.0000 | ORAL_TABLET | ORAL | Status: DC | PRN

## 2011-12-11 MED ORDER — OXYCODONE HCL 5 MG PO TABS: 5.0000 mg | ORAL_TABLET | Freq: Once | ORAL | Status: DC | PRN

## 2011-12-11 MED ORDER — DROPERIDOL 2.5 MG/ML IJ SOLN: 0.6250 mg | INTRAMUSCULAR | Status: DC | PRN

## 2011-12-11 MED ORDER — OXYCODONE HCL 5 MG/5ML PO SOLN: 5.0000 mg | Freq: Once | ORAL | Status: DC | PRN

## 2011-12-11 MED ORDER — FENTANYL CITRATE 0.05 MG/ML IJ SOLN
INTRAMUSCULAR | Status: DC | PRN
  Administered 2011-12-11 (×2): 50 ug via INTRAVENOUS
  Administered 2011-12-11: 25 ug via INTRAVENOUS

## 2011-12-11 MED ORDER — ONDANSETRON HCL 4 MG/2ML IJ SOLN: 4.0000 mg | Freq: Four times a day (QID) | INTRAMUSCULAR | Status: DC | PRN

## 2011-12-11 MED ORDER — SODIUM CHLORIDE 0.9 % IV SOLN
INTRAVENOUS | Status: DC | PRN
  Administered 2011-12-11 (×2): via INTRAVENOUS

## 2011-12-11 MED ORDER — ACETAMINOPHEN 325 MG PO TABS: 650.0000 mg | ORAL_TABLET | ORAL | Status: DC | PRN

## 2011-12-11 MED ORDER — MIDAZOLAM HCL 5 MG/5ML IJ SOLN
INTRAMUSCULAR | Status: DC | PRN
  Administered 2011-12-11: 2 mg via INTRAVENOUS

## 2011-12-11 MED ORDER — SODIUM CHLORIDE 0.9 % IV SOLN: 250.0000 mL | INTRAVENOUS | Status: DC | PRN

## 2011-12-11 NOTE — H&P (View-Only) (Signed)
PCP:  Leo Grosser, MD Primary Cardiologist::  Dr Daleen Squibb  The patient presents today for routine electrophysiology followup.  Since last being seen in our clinic, the patient reports doing very well.  He recently presented to follow-up with Dr Daleen Squibb and was found to have recurrent typical appearing atrial flutter.  The patient was started on pradaxa and now returns to our clinic.  He notices that during exercise that his heart rate is elevated.  Today, he denies symptoms of chest pain, shortness of breath, orthopnea, PND, lower extremity edema, dizziness, presyncope, syncope, or neurologic sequela.  The patient feels that he is tolerating medications without difficulties and is otherwise without complaint today.   Past Medical History  Diagnosis Date  . Hypertrophic cardiomyopathy   . Diverticulosis   . Hemorrhoids   . BPH (benign prostatic hyperplasia)   . TIA (transient ischemic attack)   . Hypertension   . Hyperlipidemia   . Atrial flutter     s/p CTI ablation by Dr Johney Frame 10/12   Past Surgical History  Procedure Date  . Basal cell carcinoma excision   . Total hip arthroplasty     right  . Total hip arthroplasty     left  . Tonsillectomy   . Umbilical hernia repair   . Atrial ablation surgery 10/12    CTI ablation for atrial flutter by Dr Johney Frame 10/12    Current Outpatient Prescriptions  Medication Sig Dispense Refill  . aspirin 81 MG tablet Take 81 mg by mouth daily. NOT TAKING ( ON HOLD )      . dabigatran (PRADAXA) 150 MG CAPS Take 1 capsule (150 mg total) by mouth every 12 (twelve) hours.  60 capsule  2  . diphenhydramine-acetaminophen (TYLENOL PM) 25-500 MG TABS Take 1 tablet by mouth at bedtime as needed.        . ezetimibe-simvastatin (VYTORIN) 10-40 MG per tablet Take 1 tablet by mouth at bedtime.  90 tablet  0  . lisinopril (PRINIVIL,ZESTRIL) 10 MG tablet       . verapamil (CALAN) 40 MG tablet Take 1 tablet (40 mg total) by mouth 3 (three) times daily.  30  tablet  3    Allergies  Allergen Reactions  . Penicillins     History   Social History  . Marital Status: Married    Spouse Name: N/A    Number of Children: 2  . Years of Education: N/A   Occupational History  . retired    Social History Main Topics  . Smoking status: Former Smoker -- 2.0 packs/day for 10 years    Types: Cigarettes    Quit date: 03/17/1970  . Smokeless tobacco: Never Used  . Alcohol Use: No     quit 16 years ago  . Drug Use: No  . Sexually Active: Not on file   Other Topics Concern  . Not on file   Social History Narrative   Lives in [place] with spouse.  2 Grown children.  Retired from Doctor, general practice    Family History  Problem Relation Age of Onset  . Colon polyps Brother   . Tuberculosis Brother     and father  . Breast cancer Mother     ROS-  All systems are reviewed and are negative except as outlined in the HPI above   Physical Exam: Filed Vitals:   11/26/11 1222  BP: 122/64  Pulse: 56  Resp: 18  Height: 5\' 10"  (1.778 m)  Weight: 172  lb 12.8 oz (78.382 kg)  SpO2: 91%    GEN- The patient is well appearing, alert and oriented x 3 today.   Head- normocephalic, atraumatic Eyes-  Sclera clear, conjunctiva pink Ears- hearing intact Oropharynx- clear Neck- supple, no JVP Lymph- no cervical lymphadenopathy Lungs- Clear to ausculation bilaterally, normal work of breathing Heart- Regular rate and rhythm, no murmurs, rubs or gallops, PMI not laterally displaced GI- soft, NT, ND, + BS Extremities- no clubbing, cyanosis, or edema MS- no significant deformity or atrophy Skin- no rash or lesion Psych- euthymic mood, full affect Neuro- strength and sensation are intact  EKG today reveals sinus rhythm 56 bpm, PR 248, QRS 98, Qtc 414, LAD EKG 11/18/11 reveals typical appearing atrial flutter with atrial flutter CL and V rate 90 bpm  Assessment and Plan: 1. Recurrent atrial flutter The patient has recurrent  symptomatic typical appearing atria flutter.  He has been restarted on pradaxa by Dr Daleen Squibb. Therapeutic strategies for atrial flutter including medicine and ablation were discussed in detail with the patient today. Risk, benefits, and alternatives to EP study and radiofrequency ablation were also discussed in detail today. These risks include but are not limited to stroke, bleeding, vascular damage, tamponade, perforation, damage to the heart and other structures, AV block requiring pacemaker, worsening renal function, and death. The patient understands these risk and wishes to proceed.  We will therefore proceed with catheter ablation at the next available time.   2. Hypertrophic Cardiomyopathy Stable at this time No changes  3. HTN Stable No change required today

## 2011-12-11 NOTE — Progress Notes (Signed)
Bedrest completed at 1900. Discharge instructions reviewed and patient ambulated in hall with no change in monitor pattern, remains in sinus brady with first degree heart block. Patient tolerated ambulation, steady gait and no chest pain, shortness of breath, dizziness or lightheadedness. Discharged to home at 1937.

## 2011-12-11 NOTE — Brief Op Note (Signed)
12/11/2011  1:03 PM  PATIENT:  [name]  71 y.o. male  PRE-OPERATIVE DIAGNOSIS:  aflutter  POST-OPERATIVE DIAGNOSIS:  * No post-op diagnosis entered *  PROCEDURE:  Procedure(s) (LRB) with comments: ATRIAL FLUTTER ABLATION (N/A)  SURGEON:  Surgeon(s) and Role:    * Hillis Range, MD - Primary  PHYSICIAN ASSISTANT:   ASSISTANTS: none   ANESTHESIA:   IV sedation  EBL:  Total I/O In: 1000 [I.V.:1000] Out: 5 [Blood:5]  BLOOD ADMINISTERED:none  DRAINS: none   LOCAL MEDICATIONS USED:  LIDOCAINE   SPECIMEN:  No Specimen  DISPOSITION OF SPECIMEN:  N/A  COUNTS:  YES  TOURNIQUET:  * No tourniquets in log *  DICTATION: .Other Dictation: Dictation Number [id number]  PLAN OF CARE: Admit for overnight observation  PATIENT DISPOSITION:  PACU - hemodynamically stable.   Delay start of Pharmacological VTE agent (>24hrs) due to surgical blood loss or risk of bleeding: not applicable

## 2011-12-11 NOTE — Anesthesia Postprocedure Evaluation (Signed)
Anesthesia Post Note  Patient: [name]  Procedure(s) Performed: Procedure(s) (LRB): ATRIAL FLUTTER ABLATION (N/A)  Anesthesia type: MAC  Patient location: PACU  Post pain: Pain level controlled  Post assessment: Patient's Cardiovascular Status Stable  Last Vitals:  Filed Vitals:   12/11/11 0812  BP: 124/72  Pulse: 56  Temp: 36.3 C  Resp: 18    Post vital signs: Reviewed and stable  Level of consciousness: sedated  Complications: No apparent anesthesia complications

## 2011-12-11 NOTE — Preoperative (Signed)
Beta Blockers   Reason not to administer Beta Blockers:Not Applicable 

## 2011-12-11 NOTE — Interval H&P Note (Signed)
History and Physical Interval Note:  12/11/2011 10:24 AM  [name]  has presented today for surgery, with the diagnosis of aflutter  The various methods of treatment have been discussed with the patient and family. After consideration of risks, benefits and other options for treatment, the patient has consented to  Procedure(s) (LRB) with comments: ATRIAL FLUTTER ABLATION (N/A) as a surgical intervention .  The patient's history has been reviewed, patient examined, no change in status, stable for surgery.  I have reviewed the patient's chart and labs.  Questions were answered to the patient's satisfaction.     Hillis Range

## 2011-12-11 NOTE — Anesthesia Preprocedure Evaluation (Addendum)
Anesthesia Evaluation  Patient identified by MRN, date of birth, ID band Patient awake    Reviewed: Allergy & Precautions, H&P , NPO status , Patient's Chart, lab work & pertinent test results, reviewed documented beta blocker date and time   Airway Mallampati: II TM Distance: >3 FB Neck ROM: Full    Dental  (+) Teeth Intact   Pulmonary former smoker,    Pulmonary exam normal       Cardiovascular hypertension, Pt. on medications + dysrhythmias Atrial Fibrillation Rhythm:Irregular     Neuro/Psych TIAnegative psych ROS   GI/Hepatic negative GI ROS, Neg liver ROS,   Endo/Other  negative endocrine ROS  Renal/GU negative Renal ROS     Musculoskeletal   Abdominal Normal abdominal exam  (+)   Peds  Hematology   Anesthesia Other Findings   Reproductive/Obstetrics                          Anesthesia Physical Anesthesia Plan  ASA: III  Anesthesia Plan: MAC   Post-op Pain Management:    Induction: Intravenous  Airway Management Planned: Mask  Additional Equipment:   Intra-op Plan:   Post-operative Plan:   Informed Consent: I have reviewed the patients History and Physical, chart, labs and discussed the procedure including the risks, benefits and alternatives for the proposed anesthesia with the patient or authorized representative who has indicated his/her understanding and acceptance.   Dental advisory given  Plan Discussed with: CRNA, Anesthesiologist and Surgeon  Anesthesia Plan Comments:         Anesthesia Quick Evaluation

## 2011-12-11 NOTE — Progress Notes (Signed)
Doing well s/p ablation Sinus on telemetry No hematoma/ bruit No complaints.  DC to home Stop pradaxa and verapamil Follow-up with me in 4 weeks  Hillis Range, MD

## 2011-12-11 NOTE — Plan of Care (Signed)
Problem: Consults Goal: EP Study Patient Education (See Patient Education module for education specifics.) Outcome: Completed/Met Date Met:  12/11/11 Reviewed Information on ablation with patient and wife with good verbalized understanding and teach back  Problem: Phase I Progression Outcomes Goal: Hemostasis of puncture sites Outcome: Completed/Met Date Met:  12/11/11 Right groin site level 0 with gauze dressing dry and intact. No swelling, bleeding, oozing or pain Goal: Pain controlled with appropriate interventions Outcome: Completed/Met Date Met:  12/11/11 No complaints of pain, remained pain free post procedure today Goal: Initial discharge plan identified Outcome: Completed/Met Date Met:  12/11/11 Planned discharge today, Dr. Johney Frame into see patient and discuss, procedure and procedure results as well as discharge instructions, questions answered. Goal: Voiding-avoid urinary catheter unless indicated Outcome: Completed/Met Date Met:  12/11/11 Voiding in adequate amounts using urinal.  Goal: Hemodynamically stable Outcome: Completed/Met Date Met:  12/11/11 Vital signs stable. Remains in Sinus bradycardia with first degree heart block and PVC's No recurring Afib-flutter noted on monitor.

## 2011-12-11 NOTE — Transfer of Care (Signed)
Immediate Anesthesia Transfer of Care Note  Patient: [name]  Procedure(s) Performed: Procedure(s) (LRB) with comments: ATRIAL FLUTTER ABLATION (N/A)  Patient Location: PACU  Anesthesia Type: MAC  Level of Consciousness: awake, alert  and oriented  Airway & Oxygen Therapy: Patient Spontanous Breathing and Patient connected to nasal cannula oxygen  Post-op Assessment: Report given to PACU RN  Post vital signs: Reviewed and stable  Complications: No apparent anesthesia complications

## 2011-12-12 NOTE — Op Note (Signed)
NAMECAMBRON, CHATHAM NO.:  [id number]  MEDICAL RECORD NO.:  [id number]  LOCATION:  6525                         FACILITY:  [facility]  PHYSICIAN:  Hillis Range, MD       DATE OF BIRTH:  05/19/1940  DATE OF PROCEDURE:  12/11/2011 DATE OF DISCHARGE:  12/11/2011                              OPERATIVE REPORT   PREPROCEDURE DIAGNOSIS:  Atrial flutter.  POSTPROCEDURE DIAGNOSIS:  Isthmus-dependent right atrial flutter.  PROCEDURES: 1. Comprehensive EP study. 2. Coronary sinus pacing and recording. 3. Mapping of SVT. 4. Arrhythmia induction with isoproterenol infusion. 5. Ablation of atrial flutter.  INTRODUCTION:  Mr. Pesicka is a pleasant 71 year old gentleman with a history of atrial flutter.  He previously was evaluated and underwent cavotricuspid isthmus ablation by me in October of 2012.  He did very well for almost a year following ablation.  Unfortunately, he returned to the office and was observed to have recurrent atrial flutter.  The atrial flutter cycle length was noted to be rather prolonged.  He was placed on Pradaxa for anticoagulation.  He now presents for EP study and radiofrequency ablation.  DESCRIPTION OF PROCEDURE:  Informed written consent was obtained and the patient was brought to the electrophysiology lab in the fasting state. He was adequately sedated with intravenous medications as outlined in the anesthesia report.  The patient's right groin was prepped and draped in the usual sterile fashion by the EP lab staff.  Using a percutaneous Seldinger technique, 16, 17, and 18-French hemostasis sheaths were placed into the right common femoral vein.  A 6-French decapolar Polaris X catheter was introduced through the right common femoral vein and advanced into the coronary sinus for recording and pacing from that location.  A 6-French quadripolar Josephson catheter was introduced through the right common femoral vein and advanced into the  right ventricle for recording and pacing.  This catheter was then pulled back to the His bundle location.  A dual decapolar 7-French halo catheter was introduced through the right common femoral vein and advanced into the right atrium.  This catheter was positioned around the tricuspid valve anulus.  The patient presented to the electrophysiology lab in sinus rhythm.  His PR interval measured 243 msec with a QRS duration of 134 msec and a QT interval of 392 msec.  The average RR interval measured 1109 msec.  His AH interval measured 142 msec with an HV interval of 55 msec.  Ventricular pacing was performed, which revealed midline decremental VA conduction with a VA Wenckebach cycle length of 560 msec. Ventricular extra stimulus testing was performed, which revealed a retrograde AV nodal ERP of greater than 600/500 msec.  Atrial pacing was then performed to evaluate the patient's conduction through the cavotricuspid isthmus.  The patient was observed to have intermittent conduction through the isthmus, however, at times the isthmus did appear to be blocked.  The cavotricuspid isthmus conduction appeared to occur only when pacing from medial and recording laterally.  When pacing from the right atrial free wall, isthmus block was preserved.  The AV Wenckebach cycle length with pacing was 510 msec.  Rapid atrial pacing was performed, which revealed no evidence of PR  greater than RR.  No arrhythmias were observed when pacing down to a cycle length of 230 msec.  Atrial extra stimulus testing was performed, which revealed an AH jump but no echo beats or tachycardias.  The AV nodal ERP was 600 for 10 msec.  Isoproterenol was infused at 2 mcg/minute.  Atrial pacing was again performed, which again revealed conduction intermittently through the cavotricuspid isthmus when pacing from the coronary sinus and measuring from the lateral side of the isthmus during isoproterenol infusion.  The AV  Wenckebach cycle length was 400 msec.  Again PR was less than RR and no tachycardias were observed when pacing down to a cycle length of 230 msec.  Atrial extra stimulus testing was performed, which revealed a reproducible AH jump but no echo beats and no tachycardias initially.  However, with atrial extra stimulus testing at 500/280 msec, the patient was observed to have atrial flutter.  The coronary sinus activation was proximal to distal and suggestive of right atrial flutter.  The atrial flutter cycle length was 290 msec.  The coronary sinus activation appeared to show counter clockwise annular rotation.  This tachycardia was therefore felt to likely represent isthmus-dependent counterclockwise right atrial flutter.  The atrial flutter was nonsustained but could again be induced.  I attempted to perform entrainment mapping, however, with pacing from the left atrium, the tachycardia abruptly terminated.  Due to hypotension, isoproterenol was discontinued and allowed to wash out.  During isoproterenol washout, atrial extra stimulus testing was again performed to try to reinduce this tachycardia, however, was unsuccessful.  Once the patient's blood pressure had recovered, isoproterenol was again infused at 2 mcg/minute and atrial extra stimulus testing was again performed.  A single AH jump was observed but no echo beats and no tachycardias could be induced. Despite multiple attempts, atrial flutter was no longer inducible. Isoproterenol was therefore discontinued.  It was felt that the patient had a small amount of conduction remaining along his cavotricuspid isthmus and that his atrial flutter represented recurrence of isthmus- dependent right atrial flutter.  I therefore elected to perform cavotricuspid isthmus ablation again today.  A 7-French [employer] XP 10 mm ablation catheter was introduced through the right common femoral vein and advanced into the right atrium.   Mapping along the cavotricuspid isthmus was performed.  This again demonstrated a long and vertical isthmus.  A series of 8 radiofrequency applications were delivered at 70 watts with a target temperature of 60 degrees for 120 seconds each.  Following ablation, complete bidirectional cavotricuspid isthmus block was achieved and demonstrated with differential atrial pacing from the low lateral right atrium.  I paced at multiple cycle lengths from 700 msec down to 250 msec and at no point did I see conduction through the cavotricuspid isthmus.  Isoproterenol was again infused at 2 mcg/minute.  Atrial extra stimulus testing was performed, which revealed decremental AV conduction with no inducible arrhythmias.  There was no evidence of conduction through the isthmus with decremental pacing.  Rapid atrial pacing was then continued down to a cycle length of 240 msec with no arrhythmias observed.  Isoproterenol was therefore discontinued and allowed to wash out.  The patient was observed for 30 minutes.  Differential atrial pacing was again performed, which revealed complete bidirectional cavotricuspid isthmus block.  The stimulus to earliest atrial activation recorded by bidirectional across the isthmus measured 150 msec.  Following ablation, the AH interval measured 148 milliseconds with an HV interval of 57 msec.  The procedure  was therefore considered completed but all catheters were removed and the sheaths were aspirated and flushed.  The sheaths were removed and hemostasis was assured.  There were no early apparent complications.  CONCLUSIONS: 1. Sinus rhythm upon presentation. 2. Return of conduction through the cavotricuspid isthmus with     recurrence of right atrial flutter. 3. Successful ablation along the cavotricuspid isthmus with complete     bidirectional isthmus block achieved. 4. The patient has dual AV nodal physiology, however, he has never had     clinical documentation  of AVNRT and     did not have inducible AVNRT today.  I therefore did not perform     slow pathway modification. 5. No inducible arrhythmias following ablation. 6. No early apparent complications.     Hillis Range, MD     JA/MEDQ  D:  12/11/2011  T:  12/12/2011  Job:  [id number]  cc:   Thomas C. Wall, MD, [facility]

## 2012-01-09 NOTE — Addendum Note (Signed)
Addended by: Lacie Scotts on: 01/09/2012 11:38 AM   Modules accepted: Orders

## 2012-01-09 NOTE — Addendum Note (Signed)
Addended by: Dennis Bast F on: 01/09/2012 11:42 AM   Modules accepted: Orders

## 2012-01-26 ENCOUNTER — Encounter: Payer: Self-pay | Admitting: Internal Medicine

## 2012-01-26 ENCOUNTER — Ambulatory Visit (INDEPENDENT_AMBULATORY_CARE_PROVIDER_SITE_OTHER): Payer: Medicare Other | Admitting: Internal Medicine

## 2012-01-26 VITALS — BP 119/75 | HR 75 | Ht 69.0 in | Wt 173.6 lb

## 2012-01-26 DIAGNOSIS — I4892 Unspecified atrial flutter: Secondary | ICD-10-CM

## 2012-01-26 DIAGNOSIS — I4891 Unspecified atrial fibrillation: Secondary | ICD-10-CM

## 2012-01-26 NOTE — Patient Instructions (Signed)
Follow up as needed

## 2012-01-26 NOTE — Progress Notes (Signed)
ELECTROPHYSIOLOGY OFFICE NOTE  Patient ID: KEONTRE CLOUTIER MRN: [id number], DOB/AGE: October 05, 1940   Date of Visit: 01/26/2012  Primary Physician: Leo Grosser, MD Primary Cardiologist: Daleen Squibb, MD Reason for Visit: Follow-up s/p ablation  History of Present Illness Mr. Stolzman is a pleasant 71 year old man with HCM and atrial flutter who presents today for EP follow-up after recent RF ablation of isthmus dependent of right atrial flutter. He has no complaints and states he feels great. He denies CP, SOB or palpitations.  Past Medical History  Diagnosis Date  . Hypertrophic cardiomyopathy   . Diverticulosis   . Hemorrhoids   . BPH (benign prostatic hyperplasia)   . TIA (transient ischemic attack) ~ 2011    denies residual (12/11/2011)  . Hypertension   . Hyperlipidemia   . Basal cell carcinoma of cheek ~ 1973    left  . Atrial flutter     s/p CTI ablation by Dr Johney Frame 10/12    Past Surgical History  Procedure Date  . Basal cell carcinoma excision ~ 1973    left cheek  . Total hip arthroplasty     left  . Atrial ablation surgery 10/12    CTI ablation for atrial flutter by Dr Johney Frame 10/12  . Cardiac catheterization 1960's  . A flutter ablation 12/11/2011  . Tonsillectomy and adenoidectomy 1947  . Joint replacement   . Hernia repair ~ 1972    umbilical     Allergies/Intolerances Allergies  Allergen Reactions  . Penicillins Other (See Comments)    RXN: unknown; "told I might have allergy 1960's"    Current Home Medications Current Outpatient Prescriptions  Medication Sig Dispense Refill  . aspirin 81 MG tablet Take 81 mg by mouth daily.      . diphenhydramine-acetaminophen (TYLENOL PM) 25-500 MG TABS Take 1 tablet by mouth at bedtime as needed. For sleep      . ezetimibe-simvastatin (VYTORIN) 10-40 MG per tablet Take 1 tablet by mouth at bedtime.  90 tablet  0  . lisinopril (PRINIVIL,ZESTRIL) 10 MG tablet Take 5 mg by mouth daily.        Social History Social  History  . Marital Status: Married   Occupational History  . retired    Social History Main Topics  . Smoking status: Former Smoker -- 2.0 packs/day for 10 years    Types: Cigarettes    Quit date: 03/17/1970  . Smokeless tobacco: Never Used  . Alcohol Use: Yes     Comment: quit 1995  . Drug Use: No   Social History Narrative   Lives in [place] with spouse.  2 Grown children.  Retired from Doctor, general practice     Review of Systems General: No chills, fever, night sweats or weight changes Cardiovascular: No chest pain, dyspnea on exertion, edema, orthopnea, palpitations, paroxysmal nocturnal dyspnea Dermatological: No rash, lesions or masses Respiratory: No cough, dyspnea Urologic: No hematuria, dysuria Abdominal: No nausea, vomiting, diarrhea, bright red blood per rectum, melena, or hematemesis Neurologic: No visual changes, weakness, changes in mental status All other systems reviewed and are otherwise negative except as noted above.  Physical Exam Blood pressure 119/75, pulse 75, height 5\' 9"  (1.753 m), weight 173 lb 9.6 oz (78.744 kg).  General: Well developed, well appearing 71 year old male in no acute distress. HEENT: Normocephalic, atraumatic. EOMs intact. Sclera nonicteric. Oropharynx clear.  Neck: Supple without bruits. No JVD. Lungs:  Respirations regular and unlabored, CTA bilaterally. No wheezes, rales or rhonchi. Heart: RRR.  S1, S2 present. No murmurs, rub, S3 or S4. Abdomen: Soft, non-tender, non-distended. BS present x 4 quadrants. No hepatosplenomegaly.  Extremities: No clubbing, cyanosis or edema. DP/PT/Radials 2+ and equal bilaterally. Psych: Normal affect. Neuro: Alert and oriented X 3. Moves all extremities spontaneously.   Diagnostics 12-lead ECG sinus rhythm with occasional PVCs  Assessment and Plan 1. Atrial flutter s/p RF ablation Mr. Shores is doing well s/p flutter ablation. He has no complaints and has resumed cycling without limitation  or difficulty. He will continue routine follow-up with his primary cardiologist, Dr. Daleen Squibb and Dr. Johney Frame will see on an as needed basis. Mr. Sandahl expressed verbal understanding and agrees with this plan of care.  Rick Duff, MMSc, PA-C Advice worker

## 2012-02-06 ENCOUNTER — Encounter: Payer: Self-pay | Admitting: Internal Medicine

## 2012-02-06 NOTE — Assessment & Plan Note (Signed)
Doing well s/p repeat ablation for atrial flutter, without recurrence No further workup planned I will see as needed going forward

## 2012-02-06 NOTE — Progress Notes (Signed)
PCP: Leo Grosser, MD Primary Cardiologist:  Dr Oren Beckmann is a 71 y.o. male who presents today for routine electrophysiology followup.  Since his recent repeat ablation for recurrent atrial flutter, the patient reports doing very well. He denies procedure related complications.  He has had no further atrial flutter.  Today, he denies symptoms of palpitations, chest pain, shortness of breath,  lower extremity edema, dizziness, presyncope, or syncope.  The patient is otherwise without complaint today.   Past Medical History  Diagnosis Date  . Hypertrophic cardiomyopathy   . Diverticulosis   . Hemorrhoids   . BPH (benign prostatic hyperplasia)   . TIA (transient ischemic attack) ~ 2011    denies residual (12/11/2011)  . Hypertension   . Hyperlipidemia   . Basal cell carcinoma of cheek ~ 1973    left  . Atrial flutter     s/p CTI ablation by Dr Johney Frame 10/12, 9/13   Past Surgical History  Procedure Date  . Basal cell carcinoma excision ~ 1973    left cheek  . Total hip arthroplasty     left  . Atrial ablation surgery 10/12, 9/13 (repeat)    CTI ablation for atrial flutter by Dr Johney Frame  . Cardiac catheterization 1960's  . A flutter ablation 12/11/2011  . Tonsillectomy and adenoidectomy 1947  . Joint replacement   . Hernia repair ~ 1972    umbilical    Current Outpatient Prescriptions  Medication Sig Dispense Refill  . aspirin 81 MG tablet Take 81 mg by mouth daily.      . diphenhydramine-acetaminophen (TYLENOL PM) 25-500 MG TABS Take 1 tablet by mouth at bedtime as needed. For sleep      . ezetimibe-simvastatin (VYTORIN) 10-40 MG per tablet Take 1 tablet by mouth at bedtime.  90 tablet  0  . lisinopril (PRINIVIL,ZESTRIL) 10 MG tablet Take 5 mg by mouth daily.        Physical Exam: Filed Vitals:   01/26/12 1555  BP: 119/75  Pulse: 75  Height: 5\' 9"  (1.753 m)  Weight: 173 lb 9.6 oz (78.744 kg)    GEN- The patient is well appearing, alert and oriented x 3  today.   Head- normocephalic, atraumatic Eyes-  Sclera clear, conjunctiva pink Ears- hearing intact Oropharynx- clear Lungs- Clear to ausculation bilaterally, normal work of breathing Heart- Regular rate and rhythm, no murmurs, rubs or gallops, PMI not laterally displaced GI- soft, NT, ND, + BS Extremities- no clubbing, cyanosis, or edema  ekg today reveals sinus rhythm 75 bpm with PVCs,, PR 246, QRS 104, Qtc 460  Assessment and Plan:

## 2012-05-19 ENCOUNTER — Telehealth: Payer: Self-pay | Admitting: Cardiology

## 2012-05-19 NOTE — Telephone Encounter (Signed)
New problem     Pt have developed a rapid heartrate today with SOB. When he is resting he is ok.He would like to talk to someone concerning this matter and possible come in to be seen.

## 2012-05-19 NOTE — Telephone Encounter (Signed)
Spoke to Dr.Allred's nurse Dennis Bast RN she advised to have patient come in 05/21/22 for EKG.Appointment scheduled for EKG 05/19/12 at 9:00 am.Patient was called and told.

## 2012-05-19 NOTE — Telephone Encounter (Signed)
Spoke with patient he stated he did light exercise this morning and heart has been elevated.States he feels like he did when he had atrial flutter.States he is not sob no chest pain. States heart rate is 60 beats/min at rest and 75 to 80 beats/min when up moving around. Patient wants appointment to see Dr.Allred.Message sent to Dr.Allred's nurse.

## 2012-05-20 ENCOUNTER — Ambulatory Visit (INDEPENDENT_AMBULATORY_CARE_PROVIDER_SITE_OTHER): Payer: Medicare Other | Admitting: *Deleted

## 2012-05-20 ENCOUNTER — Encounter: Payer: Self-pay | Admitting: *Deleted

## 2012-05-20 VITALS — BP 126/80 | HR 54 | Wt 172.4 lb

## 2012-05-20 DIAGNOSIS — R000 Tachycardia, unspecified: Secondary | ICD-10-CM

## 2012-05-20 NOTE — Progress Notes (Signed)
Patient came to office for a scheduled EKG per Tresa Endo RN with Dr Johney Frame. EKG unchanged from prior EKG.  Per patient he felt a little like he did when he had atrial flutter prior to ablation. Patient  bikes and does spin class on a regular basis.  Yesterday heart rate went up to about 185 in spin class when it normally only goes up to 130-140.  When episode happened yesterday was having some shortness of breath, denied any other symptoms.  Heart rate does go up to 80-90 when up doing things which he states is higher than normal, started yesterday.  Currently patient has no chest pains, shortness of breath, or any other symptoms. Discussed with Eula Listen and will have patient get a 48 hour monitor.

## 2012-05-25 ENCOUNTER — Encounter (INDEPENDENT_AMBULATORY_CARE_PROVIDER_SITE_OTHER): Payer: Medicare Other

## 2012-05-25 DIAGNOSIS — R0989 Other specified symptoms and signs involving the circulatory and respiratory systems: Secondary | ICD-10-CM

## 2012-05-25 DIAGNOSIS — R000 Tachycardia, unspecified: Secondary | ICD-10-CM

## 2012-05-26 ENCOUNTER — Telehealth: Payer: Self-pay | Admitting: *Deleted

## 2012-05-26 ENCOUNTER — Encounter (INDEPENDENT_AMBULATORY_CARE_PROVIDER_SITE_OTHER): Payer: Medicare Other

## 2012-05-26 DIAGNOSIS — I4891 Unspecified atrial fibrillation: Secondary | ICD-10-CM

## 2012-05-26 NOTE — Telephone Encounter (Signed)
48 hr holter monitor placed 05/26/12 TK

## 2012-06-02 ENCOUNTER — Telehealth: Payer: Self-pay | Admitting: Internal Medicine

## 2012-06-02 NOTE — Telephone Encounter (Signed)
New problem   Pt want to know his results of his holter monitor test.

## 2012-06-02 NOTE — Telephone Encounter (Signed)
Report not read as of today.  Patient feels great and he is anxious to see what the monitor shows.  I have explained to him that it may be next week

## 2012-06-09 NOTE — Telephone Encounter (Signed)
Spoke with Doylene Bode nurse manager about getting monitor on 06/02/12

## 2012-06-10 NOTE — Telephone Encounter (Signed)
Monitor received today and Dr Johney Frame will review and I will call patient

## 2012-06-11 NOTE — Telephone Encounter (Signed)
Patient aware.

## 2012-06-11 NOTE — Telephone Encounter (Signed)
Pt would like monitor results and you can call him on his home phone

## 2012-07-07 ENCOUNTER — Other Ambulatory Visit: Payer: Self-pay

## 2012-07-07 MED ORDER — EZETIMIBE-SIMVASTATIN 10-40 MG PO TABS: 1.0000 | ORAL_TABLET | Freq: Every day | ORAL | Status: DC

## 2012-07-07 NOTE — Telephone Encounter (Signed)
Patient Instructions    Your physician recommends that you schedule a follow-up appointment in: 11/26/11 with Dr Johney Frame at 12:00  Your physician has recommended you make the following change in your medication:  1) restart Calan 40mg  every 6 hoursas needed for increased heartrates  2) restart pradaxa 150mg  twice daily       Patient Instructions History Recorded                Provider Department Encounter #   11/18/2011 11:00 AM Valera Castle, MD [facility] [place] [id number]

## 2012-10-05 ENCOUNTER — Other Ambulatory Visit: Payer: Self-pay

## 2012-10-05 MED ORDER — EZETIMIBE-SIMVASTATIN 10-40 MG PO TABS: 1.0000 | ORAL_TABLET | Freq: Every day | ORAL | Status: DC

## 2012-10-20 ENCOUNTER — Other Ambulatory Visit: Payer: Self-pay | Admitting: *Deleted

## 2012-10-20 MED ORDER — LISINOPRIL 10 MG PO TABS: 5.0000 mg | ORAL_TABLET | Freq: Every day | ORAL | Status: DC

## 2012-12-28 ENCOUNTER — Ambulatory Visit (INDEPENDENT_AMBULATORY_CARE_PROVIDER_SITE_OTHER): Payer: Medicare Other | Admitting: *Deleted

## 2012-12-28 VITALS — Temp 98.2°F

## 2012-12-28 DIAGNOSIS — Z23 Encounter for immunization: Secondary | ICD-10-CM

## 2013-02-08 ENCOUNTER — Other Ambulatory Visit: Payer: Self-pay | Admitting: *Deleted

## 2013-02-08 MED ORDER — LISINOPRIL 10 MG PO TABS: 5.0000 mg | ORAL_TABLET | Freq: Every day | ORAL | Status: DC

## 2013-04-13 ENCOUNTER — Encounter: Payer: Self-pay | Admitting: Cardiology

## 2013-04-25 ENCOUNTER — Encounter: Payer: Self-pay | Admitting: Nurse Practitioner

## 2013-04-25 ENCOUNTER — Ambulatory Visit (INDEPENDENT_AMBULATORY_CARE_PROVIDER_SITE_OTHER): Payer: Medicare Other | Admitting: Nurse Practitioner

## 2013-04-25 VITALS — BP 150/84 | HR 52 | Ht 70.0 in | Wt 177.4 lb

## 2013-04-25 DIAGNOSIS — I10 Essential (primary) hypertension: Secondary | ICD-10-CM

## 2013-04-25 DIAGNOSIS — I4892 Unspecified atrial flutter: Secondary | ICD-10-CM

## 2013-04-25 LAB — BASIC METABOLIC PANEL
BUN: 20 mg/dL (ref 6–23)
CO2: 24 mEq/L (ref 19–32)
Calcium: 8.9 mg/dL (ref 8.4–10.5)
Chloride: 105 mEq/L (ref 96–112)
Creatinine, Ser: 1.2 mg/dL (ref 0.4–1.5)
GFR: 64.31 mL/min (ref >60.00)
Glucose, Bld: 89 mg/dL (ref 70–99)
Potassium: 4.5 mEq/L (ref 3.5–5.1)
Sodium: 137 mEq/L (ref 135–145)

## 2013-04-25 LAB — HEPATIC FUNCTION PANEL
ALT: 32 U/L (ref 0–53)
AST: 33 U/L (ref 0–37)
Albumin: 4.2 g/dL (ref 3.5–5.2)
Alkaline Phosphatase: 24 U/L — ABNORMAL LOW (ref 39–117)
Bilirubin, Direct: 0 mg/dL (ref 0.0–0.3)
Total Bilirubin: 0.7 mg/dL (ref 0.3–1.2)
Total Protein: 6.7 g/dL (ref 6.0–8.3)

## 2013-04-25 LAB — LIPID PANEL
Cholesterol: 134 mg/dL (ref 0–200)
HDL: 59 mg/dL (ref >39.00)
LDL Cholesterol: 63 mg/dL (ref 0–99)
Total CHOL/HDL Ratio: 2
Triglycerides: 58 mg/dL (ref 0.0–149.0)
VLDL: 11.6 mg/dL (ref 0.0–40.0)

## 2013-04-25 MED ORDER — EZETIMIBE-SIMVASTATIN 10-40 MG PO TABS: 1.0000 | ORAL_TABLET | Freq: Every day | ORAL | Status: DC

## 2013-04-25 MED ORDER — LISINOPRIL 10 MG PO TABS: 5.0000 mg | ORAL_TABLET | Freq: Every day | ORAL | Status: DC

## 2013-04-25 NOTE — Progress Notes (Signed)
[name] Date of Birth: October 16, 1940 Medical Record [id number]  History of Present Illness: Mr. Acree is seen back today for a follow up visit. Seen for Dr. Rayann Heman. Former patient of Dr. Winnifred Friar. He has HCM with atrial flutter - has had RF ablation for his atrial flutter. Other issues include HTN, HLD, TIA back in 2013 and BPH. Past echo from 2012 shows mild LV dysfunction. Remote Myoview from 2001. Has a chronically abnormal EKG.   Last seen in November of 2013. Was felt to be doing well.  Comes back today. Here alone. Doing ok. He has basically had a good year. No chest pain. Not short of breath. No dizzy spells. Remains very active and has a good exercise habit. Notes that his only issue happened about 4 weeks ago - noted a rapid heart rate with just normal exertion/activities - little short of breath - resolved after about a week. Noted his heart rate in the 90s - did not check his pulse for regularity. No problems since. BP is great at home.   Current Outpatient Prescriptions  Medication Sig Dispense Refill  . aspirin 81 MG tablet Take 81 mg by mouth daily.      Marland Kitchen doxylamine, Sleep, (UNISOM) 25 MG tablet Take 25 mg by mouth at bedtime as needed.      . ezetimibe-simvastatin (VYTORIN) 10-40 MG per tablet Take 1 tablet by mouth at bedtime.  90 tablet  3  . lisinopril (PRINIVIL,ZESTRIL) 10 MG tablet Take 0.5 tablets (5 mg total) by mouth daily.  45 tablet  0   No current facility-administered medications for this visit.    Allergies  Allergen Reactions  . Penicillins Other (See Comments)    RXN: unknown; "told I might have allergy 1960's"    Past Medical History  Diagnosis Date  . Hypertrophic cardiomyopathy   . Diverticulosis   . Hemorrhoids   . BPH (benign prostatic hyperplasia)   . TIA (transient ischemic attack) ~ 2011    denies residual (12/11/2011)  . Hypertension   . Hyperlipidemia   . Basal cell carcinoma of cheek ~ 1973    left  . Atrial flutter     s/p CTI  ablation by Dr Rayann Heman 10/12, 9/13    Past Surgical History  Procedure Laterality Date  . Basal cell carcinoma excision  ~ 1973    left cheek  . Total hip arthroplasty      left  . Atrial ablation surgery  10/12, 9/13 (repeat)    CTI ablation for atrial flutter by Dr Rayann Heman  . Cardiac catheterization  1960's  . A flutter ablation  12/11/2011  . Tonsillectomy and adenoidectomy  1947  . Joint replacement    . Hernia repair  ~ [id number]    umbilical    History  Smoking status  . Former Smoker -- 2.00 packs/day for 10 years  . Types: Cigarettes  . Quit date: 03/17/1970  Smokeless tobacco  . Never Used    History  Alcohol Use  . Yes    Comment: quit 1995    Family History  Problem Relation Age of Onset  . Colon polyps Brother   . Tuberculosis Brother     and father  . Breast cancer Mother     Review of Systems: The review of systems is per the HPI.  All other systems were reviewed and are negative.  Physical Exam: BP 150/84  Pulse 52  Ht 5\' 10"  (1.778 m)  Wt 177 lb 6.4 oz (  80.468 kg)  BMI 25.45 kg/m2 Repeat BP by me is 105/80.  Patient is very pleasant and in no acute distress. Skin is warm and dry. Color is normal.  HEENT is unremarkable. Normocephalic/atraumatic. PERRL. Sclera are nonicteric. Neck is supple. No masses. No JVD. Lungs are clear. Cardiac exam shows a regular rate and rhythm. Abdomen is soft. Extremities are without edema. Gait and ROM are intact. No gross neurologic deficits noted.  LABORATORY DATA: EKG today shows sinus brady, 1st degree AV block, prior inferior infarct and anterior infarct - tracing is unchanged.   Lab Results  Component Value Date   WBC 6.2 12/08/2011   HGB 13.6 12/08/2011   HCT 41.3 12/08/2011   PLT 179.0 12/08/2011   GLUCOSE 80 12/08/2011   CHOL  Value: 124        ATP III CLASSIFICATION:  <200     mg/dL   Desirable  200-239  mg/dL   Borderline High  >=240    mg/dL   High        01/15/2009   TRIG 56 01/15/2009   HDL 57 01/15/2009    LDLCALC  Value: 56        Total Cholesterol/HDL:CHD Risk Coronary Heart Disease Risk Table                     Men   Women  1/2 Average Risk   3.4   3.3  Average Risk       5.0   4.4  2 X Average Risk   9.6   7.1  3 X Average Risk  23.4   11.0        Use the calculated Patient Ratio above and the CHD Risk Table to determine the patient's CHD Risk.        ATP III CLASSIFICATION (LDL):  <100     mg/dL   Optimal  100-129  mg/dL   Near or Above                    Optimal  130-159  mg/dL   Borderline  160-189  mg/dL   High  >190     mg/dL   Very High 01/15/2009   ALT 14 05/01/2009   AST 18 05/01/2009   NA 139 12/08/2011   K 4.4 12/08/2011   CL 108 12/08/2011   CREATININE 1.2 12/08/2011   BUN 24* 12/08/2011   CO2 25 12/08/2011   TSH 1.091 Test methodology is 3rd generation TSH 04/30/2009   INR 1.2* 12/08/2011   Echo Study Conclusions from 2012  - Left ventricle: The cavity size was normal. There was asymmetric septal hypertrophy. Indeterminant diastolic function. Systolic function was mildly reduced. The estimated ejection fraction was 45%. Diffuse hypokinesis. No LV outflow tract gradient. Septal thickness: 34mm (ED, 2D). Posterior wall thickness: 22mm (ED, 2D). - Aortic valve: There was no stenosis. Trivial regurgitation. - Mitral valve: Trivial regurgitation. No systolic anterior motion of the mitral valve. - Left atrium: The atrium was mildly dilated. - Right ventricle: The cavity size was normal. Systolic function was normal. - Right atrium: The atrium was mildly dilated. - Tricuspid valve: Peak RV-RA gradient: 75mm Hg (S). - Pulmonary arteries: PA systolic pressure 92-11 mmHg. - Systemic veins: IVC measured 2.2 cm with normal respirophasic variation, suggesting RA pressure 6-10 mmHg.    Assessment / Plan:  1. Atrial flutter - past ablation - doing well but may have a spells of arrhythmia a month ago - I  have asked him to call and let us know if this recurs so we can check an EGK - may have to  consider a heart monitor as well.   2. HTN - BP is great by my recheck. I have refilled his medicines today.   3. HLD - recheck fasting labs today - Vytorin refilled.   4. Past TIA  5. Mild LV dysfunction per remote echo - will update his echo. He is currently asymptomatic.   Patient is agreeable to this plan and will call if any problems develop in the interim.   Burtis Junes, RN, [facility] [address] [place], [place]  [id number] [phone]

## 2013-04-25 NOTE — Patient Instructions (Addendum)
Stay on your current medicines - I have refilled Vytorin and Lisinopril today  Continue to monitor your blood pressure at home  We will check labs today  We will schedule an ultrasound of your heart - to relook at your pumping function  If you have any more spells of elevated heart rate - call up here to get an EKG - if this is ok - may need to consider a heart monitor  See Dr. Rayann Heman in one year  Call the [place] office at [phone] if you have any questions, problems or concerns.

## 2013-04-26 ENCOUNTER — Telehealth: Payer: Self-pay | Admitting: Nurse Practitioner

## 2013-04-26 NOTE — Telephone Encounter (Signed)
New message    Patient calling wants medication switch from  [facility] [address].

## 2013-04-29 ENCOUNTER — Other Ambulatory Visit: Payer: Self-pay | Admitting: *Deleted

## 2013-04-29 MED ORDER — LISINOPRIL 10 MG PO TABS: 5.0000 mg | ORAL_TABLET | Freq: Every day | ORAL | Status: DC

## 2013-04-29 MED ORDER — EZETIMIBE-SIMVASTATIN 10-40 MG PO TABS: 1.0000 | ORAL_TABLET | Freq: Every day | ORAL | Status: DC

## 2013-05-23 ENCOUNTER — Ambulatory Visit (HOSPITAL_COMMUNITY): Payer: Medicare Other | Attending: Cardiology | Admitting: Cardiology

## 2013-05-23 ENCOUNTER — Encounter: Payer: Self-pay | Admitting: Cardiology

## 2013-05-23 DIAGNOSIS — I4892 Unspecified atrial flutter: Secondary | ICD-10-CM

## 2013-05-23 DIAGNOSIS — I10 Essential (primary) hypertension: Secondary | ICD-10-CM | POA: Insufficient documentation

## 2013-05-23 NOTE — Progress Notes (Signed)
Echo performed. 

## 2013-05-23 NOTE — Telephone Encounter (Signed)
Last note was done in error pt was not dismissed from [facility], pt aware of echo results

## 2013-07-21 ENCOUNTER — Telehealth: Payer: Self-pay | Admitting: Family Medicine

## 2013-07-21 NOTE — Telephone Encounter (Signed)
Patient would like rx for anti malaria pill if possible please call him back with questions at [phone] [facility] is [facility] [address]

## 2013-07-21 NOTE — Telephone Encounter (Signed)
LMTRC

## 2013-07-22 NOTE — Telephone Encounter (Signed)
Patient returned call.   States he is going to [place] for a missions trip and would like this medication for prophylactic.   MD please advise.

## 2013-07-25 ENCOUNTER — Telehealth: Payer: Self-pay | Admitting: Family Medicine

## 2013-07-25 MED ORDER — DOXYCYCLINE HYCLATE 100 MG PO TABS: ORAL_TABLET | ORAL | Status: DC

## 2013-07-25 NOTE — Telephone Encounter (Signed)
Pt leaving 5/17 and returning 08/17/13. Med sent to pharm and pt aware

## 2013-07-25 NOTE — Telephone Encounter (Signed)
LMTRC

## 2013-07-25 NOTE — Telephone Encounter (Signed)
Start doxycycline 100 mg poqday 1-2 days prior to travel, during trip and for 4 weeks after return.  Call out quantity sufficient depending on duration of his trip.

## 2013-07-25 NOTE — Telephone Encounter (Signed)
PATIENT SAYS THERE IS STILL NOTHING CALLED IN FOR HIS TRIP GOING OUT OF COUNTRY PLEASE

## 2013-09-08 ENCOUNTER — Other Ambulatory Visit: Payer: Medicare Other

## 2013-09-08 ENCOUNTER — Other Ambulatory Visit: Payer: Self-pay | Admitting: Family Medicine

## 2013-09-08 DIAGNOSIS — Z0000 Encounter for general adult medical examination without abnormal findings: Secondary | ICD-10-CM

## 2013-09-08 DIAGNOSIS — E785 Hyperlipidemia, unspecified: Secondary | ICD-10-CM

## 2013-09-08 DIAGNOSIS — I10 Essential (primary) hypertension: Secondary | ICD-10-CM

## 2013-09-08 LAB — COMPLETE METABOLIC PANEL WITH GFR
ALT: 18 U/L (ref 0–53)
AST: 25 U/L (ref 0–37)
Albumin: 4.1 g/dL (ref 3.5–5.2)
Alkaline Phosphatase: 22 U/L — ABNORMAL LOW (ref 39–117)
BUN: 26 mg/dL — ABNORMAL HIGH (ref 6–23)
CO2: 23 mEq/L (ref 19–32)
Calcium: 9.1 mg/dL (ref 8.4–10.5)
Chloride: 104 mEq/L (ref 96–112)
Creat: 1.39 mg/dL — ABNORMAL HIGH (ref 0.50–1.35)
GFR, Est African American: 58 mL/min — ABNORMAL LOW
GFR, Est Non African American: 50 mL/min — ABNORMAL LOW
Glucose, Bld: 88 mg/dL (ref 70–99)
Potassium: 4.8 mEq/L (ref 3.5–5.3)
Sodium: 138 mEq/L (ref 135–145)
Total Bilirubin: 0.6 mg/dL (ref 0.2–1.2)
Total Protein: 6.1 g/dL (ref 6.0–8.3)

## 2013-09-08 LAB — LIPID PANEL
Cholesterol: 131 mg/dL (ref 0–200)
HDL: 62 mg/dL (ref >39)
LDL Cholesterol: 54 mg/dL (ref 0–99)
Total CHOL/HDL Ratio: 2.1 Ratio
Triglycerides: 73 mg/dL (ref <150)
VLDL: 15 mg/dL (ref 0–40)

## 2013-09-08 LAB — CBC WITH DIFFERENTIAL/PLATELET
Basophils Absolute: 0 10*3/uL (ref 0.0–0.1)
Basophils Relative: 0 % (ref 0–1)
Eosinophils Absolute: 0.2 10*3/uL (ref 0.0–0.7)
Eosinophils Relative: 3 % (ref 0–5)
HCT: 42.4 % (ref 39.0–52.0)
Hemoglobin: 14.2 g/dL (ref 13.0–17.0)
Lymphocytes Relative: 37 % (ref 12–46)
Lymphs Abs: 2.6 10*3/uL (ref 0.7–4.0)
MCH: 30.5 pg (ref 26.0–34.0)
MCHC: 33.5 g/dL (ref 30.0–36.0)
MCV: 91 fL (ref 78.0–100.0)
Monocytes Absolute: 0.7 10*3/uL (ref 0.1–1.0)
Monocytes Relative: 10 % (ref 3–12)
Neutro Abs: 3.5 10*3/uL (ref 1.7–7.7)
Neutrophils Relative %: 50 % (ref 43–77)
Platelets: 200 10*3/uL (ref 150–400)
RBC: 4.66 MIL/uL (ref 4.22–5.81)
RDW: 13.3 % (ref 11.5–15.5)
WBC: 6.9 10*3/uL (ref 4.0–10.5)

## 2013-09-12 ENCOUNTER — Ambulatory Visit (INDEPENDENT_AMBULATORY_CARE_PROVIDER_SITE_OTHER): Payer: Medicare Other | Admitting: Family Medicine

## 2013-09-12 ENCOUNTER — Encounter: Payer: Self-pay | Admitting: Family Medicine

## 2013-09-12 VITALS — BP 128/64 | HR 56 | Temp 97.1°F | Resp 12 | Ht 70.0 in | Wt 172.0 lb

## 2013-09-12 DIAGNOSIS — Z0000 Encounter for general adult medical examination without abnormal findings: Secondary | ICD-10-CM

## 2013-09-12 NOTE — Progress Notes (Signed)
Subjective:    Patient ID: [name], male    DOB: 07-20-1940, 73 y.o.   MRN: [id number]  HPI  Subjective:   Patient presents for Medicare Annual/Subsequent preventive examination. Patient has no medical concerns. His most recent lab work as listed below. Is significant for a mild elevation in his creatinine to 1.39. Otherwise his lab work is excellent: Lab on 09/08/2013  Component Date Value Ref Range Status  . WBC 09/08/2013 6.9  4.0 - 10.5 K/uL Final  . RBC 09/08/2013 4.66  4.22 - 5.81 MIL/uL Final  . Hemoglobin 09/08/2013 14.2  13.0 - 17.0 g/dL Final  . HCT 09/08/2013 42.4  39.0 - 52.0 % Final  . MCV 09/08/2013 91.0  78.0 - 100.0 fL Final  . MCH 09/08/2013 30.5  26.0 - 34.0 pg Final  . MCHC 09/08/2013 33.5  30.0 - 36.0 g/dL Final  . RDW 09/08/2013 13.3  11.5 - 15.5 % Final  . Platelets 09/08/2013 200  150 - 400 K/uL Final  . Neutrophils Relative % 09/08/2013 50  43 - 77 % Final  . Neutro Abs 09/08/2013 3.5  1.7 - 7.7 K/uL Final  . Lymphocytes Relative 09/08/2013 37  12 - 46 % Final  . Lymphs Abs 09/08/2013 2.6  0.7 - 4.0 K/uL Final  . Monocytes Relative 09/08/2013 10  3 - 12 % Final  . Monocytes Absolute 09/08/2013 0.7  0.1 - 1.0 K/uL Final  . Eosinophils Relative 09/08/2013 3  0 - 5 % Final  . Eosinophils Absolute 09/08/2013 0.2  0.0 - 0.7 K/uL Final  . Basophils Relative 09/08/2013 0  0 - 1 % Final  . Basophils Absolute 09/08/2013 0.0  0.0 - 0.1 K/uL Final  . Smear Review 09/08/2013 Criteria for review not met   Final  . Cholesterol 09/08/2013 131  0 - 200 mg/dL Final   Comment: ATP III Classification:                                < 200        mg/dL        Desirable                               200 - 239     mg/dL        Borderline High                               >= 240        mg/dL        High                             . Triglycerides 09/08/2013 73  <150 mg/dL Final  . HDL 09/08/2013 62  >39 mg/dL Final  . Total CHOL/HDL Ratio 09/08/2013 2.1   Final  .  VLDL 09/08/2013 15  0 - 40 mg/dL Final  . LDL Cholesterol 09/08/2013 54  0 - 99 mg/dL Final   Comment:                            Total Cholesterol/HDL Ratio:CHD Risk  Coronary Heart Disease Risk Table                                                                 Men       Women                                   1/2 Average Risk              3.4        3.3                                       Average Risk              5.0        4.4                                    2X Average Risk              9.6        7.1                                    3X Average Risk             23.4       11.0                          Use the calculated Patient Ratio above and the CHD Risk table                           to determine the patient's CHD Risk.                          ATP III Classification (LDL):                                < 100        mg/dL         Optimal                               100 - 129     mg/dL         Near or Above Optimal                               130 - 159     mg/dL         Borderline High                               160 - 189     mg/dL           High                                > 190        mg/dL         Very High                             . Sodium 09/08/2013 138  135 - 145 mEq/L Final  . Potassium 09/08/2013 4.8  3.5 - 5.3 mEq/L Final  . Chloride 09/08/2013 104  96 - 112 mEq/L Final  . CO2 09/08/2013 23  19 - 32 mEq/L Final  . Glucose, Bld 09/08/2013 88  70 - 99 mg/dL Final  . BUN 09/08/2013 26* 6 - 23 mg/dL Final  . Creat 09/08/2013 1.39* 0.50 - 1.35 mg/dL Final  . Total Bilirubin 09/08/2013 0.6  0.2 - 1.2 mg/dL Final  . Alkaline Phosphatase 09/08/2013 22* 39 - 117 U/L Final  . AST 09/08/2013 25  0 - 37 U/L Final  . ALT 09/08/2013 18  0 - 53 U/L Final  . Total Protein 09/08/2013 6.1  6.0 - 8.3 g/dL Final  . Albumin 09/08/2013 4.1  3.5 - 5.2 g/dL Final  . Calcium 09/08/2013 9.1  8.4 - 10.5 mg/dL Final  . GFR, Est  African American 09/08/2013 58*  Final  . GFR, Est Non African American 09/08/2013 50*  Final   Comment:                            The estimated GFR is a calculation valid for adults (>=19 years old)                          that uses the CKD-EPI algorithm to adjust for age and sex. It is                            not to be used for children, pregnant women, hospitalized patients,                             patients on dialysis, or with rapidly changing kidney function.                          According to the NKDEP, eGFR >89 is normal, 60-89 shows mild                          impairment, 30-59 shows moderate impairment, 15-29 shows severe                          impairment and <15 is ESRD.                                Review Past Medical/Family/Social:  Past Medical History  Diagnosis Date  . Hypertrophic cardiomyopathy   . Diverticulosis   . Hemorrhoids   . BPH (benign prostatic hyperplasia)   . TIA (transient ischemic attack) ~ 2011    denies residual (12/11/2011)  . Hypertension   . Hyperlipidemia   . Basal cell carcinoma of cheek ~  1973    left  . Atrial flutter     s/p CTI ablation by Dr Rayann Heman 10/12, 9/13   Past Surgical History  Procedure Laterality Date  . Basal cell carcinoma excision  ~ 1973    left cheek  . Total hip arthroplasty      left  . Atrial ablation surgery  10/12, 9/13 (repeat)    CTI ablation for atrial flutter by Dr Rayann Heman  . Cardiac catheterization  1960's  . A flutter ablation  12/11/2011  . Tonsillectomy and adenoidectomy  1947  . Joint replacement    . Hernia repair  ~ 9563    umbilical   Current Outpatient Prescriptions on File Prior to Visit  Medication Sig Dispense Refill  . doxylamine, Sleep, (UNISOM) 25 MG tablet Take 25 mg by mouth at bedtime as needed.      . ezetimibe-simvastatin (VYTORIN) 10-40 MG per tablet Take 1 tablet by mouth at bedtime.  90 tablet  3  . lisinopril (PRINIVIL,ZESTRIL) 10 MG tablet Take 0.5 tablets (5 mg  total) by mouth daily.  45 tablet  3  . aspirin 81 MG tablet Take 81 mg by mouth daily.       No current facility-administered medications on file prior to visit.   Allergies  Allergen Reactions  . Penicillins Other (See Comments)    RXN: unknown; "told I might have allergy 1960's"   History   Social History  . Marital Status: Married    Spouse Name: N/A    Number of Children: 2  . Years of Education: N/A   Occupational History  . retired    Social History Main Topics  . Smoking status: Former Smoker -- 2.00 packs/day for 10 years    Types: Cigarettes    Quit date: 03/17/1970  . Smokeless tobacco: Never Used  . Alcohol Use: Yes     Comment: quit 1995  . Drug Use: No  . Sexual Activity: Yes   Other Topics Concern  . Not on file   Social History Narrative   Lives in [place] with spouse.  2 Grown children.  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 Colon polyps Brother   . Tuberculosis Brother     and father  . Breast cancer Mother     Depression Screen  (Note: if answer to either of the following is "Yes", a more complete depression screening is indicated)  Over the past two weeks, have you felt down, depressed or hopeless? No Over the past two weeks, have you felt little interest or pleasure in doing things? No Have you lost interest or pleasure in daily life? No Do you often feel hopeless? No Do you cry easily over simple problems? No   Activities of Daily Living  In your present state of health, do you have any difficulty performing the following activities?:  Driving? No  Managing money? No  Feeding yourself? No  Getting from bed to chair? No  Climbing a flight of stairs? No  Preparing food and eating?: No  Bathing or showering? No  Getting dressed: No  Getting to the toilet? No  Using the toilet:No  Moving around from place to place: No  In the past year have you fallen or had a near fall?:No    Are you sexually active? No  Do you have more than one partner? No   Hearing Difficulties: No  Do you often ask people to  speak up or repeat themselves? No  Do you experience ringing or noises in your ears? No Do you have difficulty understanding soft or whispered voices? No  Do you feel that you have a problem with memory? No Do you often misplace items? No  Do you feel safe at home? Yes  Cognitive Testing  Alert? Yes Normal Appearance?Yes  Oriented to person? Yes Place? Yes  Time? Yes  Recall of three objects? Yes  Can perform simple calculations? Yes  Displays appropriate judgment?Yes  Can read the correct time from a watch face?Yes   Screening Tests / Date Colonoscopy       2012 (q 5 years)              Zostavax UTD Pneumovax 23- UTD Influenza Vaccine UTD Tetanus/tdap 2013        Review of Systems  All other systems reviewed and are negative.      Objective:   Physical Exam  Vitals reviewed. Constitutional: He is oriented to person, place, and time. He appears well-developed and well-nourished. No distress.  HENT:  Head: Normocephalic and atraumatic.  Right Ear: External ear normal.  Left Ear: External ear normal.  Nose: Nose normal.  Mouth/Throat: Oropharynx is clear and moist. No oropharyngeal exudate.  Eyes: Conjunctivae and EOM are normal. Pupils are equal, round, and reactive to light. Right eye exhibits no discharge. Left eye exhibits no discharge. No scleral icterus.  Neck: Normal range of motion. Neck supple. No JVD present. No tracheal deviation present. No thyromegaly present.  Cardiovascular: Normal rate, regular rhythm, normal heart sounds and intact distal pulses.  Exam reveals no gallop and no friction rub.   No murmur heard. Pulmonary/Chest: Effort normal and breath sounds normal. No stridor. No respiratory distress. He has no wheezes. He has no rales. He exhibits no tenderness.  Abdominal: Soft. Bowel sounds are normal. He exhibits no  distension and no mass. There is no tenderness. There is no rebound and no guarding.  Genitourinary: Rectum normal, prostate normal and penis normal.  Musculoskeletal: Normal range of motion. He exhibits no edema and no tenderness.  Lymphadenopathy:    He has no cervical adenopathy.  Neurological: He is alert and oriented to person, place, and time. He has normal reflexes. He displays normal reflexes. No cranial nerve deficit. He exhibits normal muscle tone. Coordination normal.  Skin: Skin is warm. No rash noted. He is not diaphoretic. No erythema. No pallor.  Psychiatric: He has a normal mood and affect. His behavior is normal. Judgment and thought content normal.          Assessment & Plan:  1. Routine general medical examination at a health care facility Physical exam is completely normal. I have asked the patient to try to increase his oral fluid intake to treat dehydration. I like him to return in one to 2 weeks to repeat a BMP after he is better hydrated to see his creatinine will improve. Otherwise his lab work is excellent. His cancer screening is up to date. I will add a PSA to his lab work. I recommended Prevnar 13 but the patient declined  Screen negative for depression.  Medicare Attestation  I have personally reviewed:  The patient's medical and social history  Their use of alcohol, tobacco or illicit drugs  Their current medications and supplements  The patient's functional ability including ADLs,fall risks, home safety risks, cognitive, and hearing and visual impairment  Diet and physical activities  Evidence for depression or mood disorders  The patient's weight, height, BMI, and visual acuity have been recorded in the chart. I have made referrals, counseling, and provided education to the patient based on review of the above and I have provided the patient with a written personalized care plan for preventive services.

## 2013-09-13 ENCOUNTER — Encounter: Payer: Self-pay | Admitting: Family Medicine

## 2013-09-13 LAB — PSA: PSA: 3.73 ng/mL (ref <=4.00)

## 2013-12-02 ENCOUNTER — Emergency Department (HOSPITAL_COMMUNITY): Payer: Medicare Other

## 2013-12-02 ENCOUNTER — Observation Stay (HOSPITAL_COMMUNITY): Payer: Medicare Other

## 2013-12-02 ENCOUNTER — Observation Stay (HOSPITAL_COMMUNITY)
Admission: EM | Admit: 2013-12-02 | Discharge: 2013-12-04 | Disposition: A | Discharge status: Home / Self Care | Payer: Medicare Other | Attending: Internal Medicine | Admitting: Internal Medicine

## 2013-12-02 ENCOUNTER — Encounter (HOSPITAL_COMMUNITY): Payer: Self-pay | Admitting: Radiology

## 2013-12-02 ENCOUNTER — Inpatient Hospital Stay (HOSPITAL_COMMUNITY): Payer: Medicare Other

## 2013-12-02 DIAGNOSIS — E785 Hyperlipidemia, unspecified: Secondary | ICD-10-CM | POA: Insufficient documentation

## 2013-12-02 DIAGNOSIS — Z7982 Long term (current) use of aspirin: Secondary | ICD-10-CM | POA: Insufficient documentation

## 2013-12-02 DIAGNOSIS — I422 Other hypertrophic cardiomyopathy: Secondary | ICD-10-CM | POA: Diagnosis present

## 2013-12-02 DIAGNOSIS — N289 Disorder of kidney and ureter, unspecified: Secondary | ICD-10-CM | POA: Diagnosis not present

## 2013-12-02 DIAGNOSIS — G459 Transient cerebral ischemic attack, unspecified: Principal | ICD-10-CM | POA: Insufficient documentation

## 2013-12-02 DIAGNOSIS — Z8673 Personal history of transient ischemic attack (TIA), and cerebral infarction without residual deficits: Secondary | ICD-10-CM | POA: Diagnosis not present

## 2013-12-02 DIAGNOSIS — I4892 Unspecified atrial flutter: Secondary | ICD-10-CM | POA: Diagnosis present

## 2013-12-02 DIAGNOSIS — Z87891 Personal history of nicotine dependence: Secondary | ICD-10-CM | POA: Diagnosis not present

## 2013-12-02 DIAGNOSIS — R4789 Other speech disturbances: Secondary | ICD-10-CM | POA: Diagnosis present

## 2013-12-02 DIAGNOSIS — I10 Essential (primary) hypertension: Secondary | ICD-10-CM | POA: Diagnosis not present

## 2013-12-02 DIAGNOSIS — I429 Cardiomyopathy, unspecified: Secondary | ICD-10-CM

## 2013-12-02 DIAGNOSIS — G451 Carotid artery syndrome (hemispheric): Secondary | ICD-10-CM

## 2013-12-02 LAB — URINALYSIS, ROUTINE W REFLEX MICROSCOPIC
Bilirubin Urine: NEGATIVE
Glucose, UA: NEGATIVE mg/dL
Hgb urine dipstick: NEGATIVE
Ketones, ur: 15 mg/dL — AB
Leukocytes, UA: NEGATIVE
Nitrite: NEGATIVE
Protein, ur: NEGATIVE mg/dL
Specific Gravity, Urine: 1.023 (ref 1.005–1.030)
Urobilinogen, UA: 0.2 mg/dL (ref 0.0–1.0)
pH: 5.5 (ref 5.0–8.0)

## 2013-12-02 LAB — I-STAT CHEM 8, ED
BUN: 33 mg/dL — ABNORMAL HIGH (ref 6–23)
Calcium, Ion: 1.12 mmol/L — ABNORMAL LOW (ref 1.13–1.30)
Chloride: 107 mEq/L (ref 96–112)
Creatinine, Ser: 1.5 mg/dL — ABNORMAL HIGH (ref 0.50–1.35)
Glucose, Bld: 90 mg/dL (ref 70–99)
HCT: 43 % (ref 39.0–52.0)
Hemoglobin: 14.6 g/dL (ref 13.0–17.0)
Potassium: 4.7 mEq/L (ref 3.7–5.3)
Sodium: 138 mEq/L (ref 137–147)
TCO2: 21 mmol/L (ref 0–100)

## 2013-12-02 LAB — DIFFERENTIAL
Basophils Absolute: 0 10*3/uL (ref 0.0–0.1)
Basophils Relative: 0 % (ref 0–1)
Eosinophils Absolute: 0.2 10*3/uL (ref 0.0–0.7)
Eosinophils Relative: 2 % (ref 0–5)
Lymphocytes Relative: 31 % (ref 12–46)
Lymphs Abs: 3.3 10*3/uL (ref 0.7–4.0)
Monocytes Absolute: 0.9 10*3/uL (ref 0.1–1.0)
Monocytes Relative: 9 % (ref 3–12)
Neutro Abs: 6.2 10*3/uL (ref 1.7–7.7)
Neutrophils Relative %: 58 % (ref 43–77)

## 2013-12-02 LAB — ETHANOL: Alcohol, Ethyl (B): <11 mg/dL (ref 0–11)

## 2013-12-02 LAB — RAPID URINE DRUG SCREEN, HOSP PERFORMED
Amphetamines: NOT DETECTED
Barbiturates: NOT DETECTED
Benzodiazepines: NOT DETECTED
Cocaine: NOT DETECTED
Opiates: NOT DETECTED
Tetrahydrocannabinol: NOT DETECTED

## 2013-12-02 LAB — COMPREHENSIVE METABOLIC PANEL
ALT: 22 U/L (ref 0–53)
AST: 37 U/L (ref 0–37)
Albumin: 4 g/dL (ref 3.5–5.2)
Alkaline Phosphatase: 30 U/L — ABNORMAL LOW (ref 39–117)
Anion gap: 15 (ref 5–15)
BUN: 31 mg/dL — ABNORMAL HIGH (ref 6–23)
CO2: 20 mEq/L (ref 19–32)
Calcium: 9.2 mg/dL (ref 8.4–10.5)
Chloride: 104 mEq/L (ref 96–112)
Creatinine, Ser: 1.41 mg/dL — ABNORMAL HIGH (ref 0.50–1.35)
GFR calc Af Amer: 56 mL/min — ABNORMAL LOW (ref >90)
GFR calc non Af Amer: 48 mL/min — ABNORMAL LOW (ref >90)
Glucose, Bld: 91 mg/dL (ref 70–99)
Potassium: 5 mEq/L (ref 3.7–5.3)
Sodium: 139 mEq/L (ref 137–147)
Total Bilirubin: 0.3 mg/dL (ref 0.3–1.2)
Total Protein: 6.6 g/dL (ref 6.0–8.3)

## 2013-12-02 LAB — PROTIME-INR
INR: 1.09 (ref 0.00–1.49)
Prothrombin Time: 14.1 seconds (ref 11.6–15.2)

## 2013-12-02 LAB — CBC
HCT: 41.5 % (ref 39.0–52.0)
Hemoglobin: 13.9 g/dL (ref 13.0–17.0)
MCH: 30.3 pg (ref 26.0–34.0)
MCHC: 33.5 g/dL (ref 30.0–36.0)
MCV: 90.6 fL (ref 78.0–100.0)
Platelets: 193 10*3/uL (ref 150–400)
RBC: 4.58 MIL/uL (ref 4.22–5.81)
RDW: 12.6 % (ref 11.5–15.5)
WBC: 10.6 10*3/uL — ABNORMAL HIGH (ref 4.0–10.5)

## 2013-12-02 LAB — I-STAT TROPONIN, ED: Troponin i, poc: 0.04 ng/mL (ref 0.00–0.08)

## 2013-12-02 LAB — APTT: aPTT: 27 seconds (ref 24–37)

## 2013-12-02 MED ORDER — ASPIRIN 325 MG PO TABS
325.0000 mg | ORAL_TABLET | Freq: Every day | ORAL | Status: DC
  Administered 2013-12-02: 325 mg via ORAL
  Filled 2013-12-02 (×2): qty 1

## 2013-12-02 MED ORDER — EZETIMIBE-SIMVASTATIN 10-40 MG PO TABS
1.0000 | ORAL_TABLET | Freq: Every day | ORAL | Status: DC
  Administered 2013-12-02 – 2013-12-03 (×2): 1 via ORAL
  Filled 2013-12-02 (×4): qty 1

## 2013-12-02 MED ORDER — ENOXAPARIN SODIUM 30 MG/0.3ML ~~LOC~~ SOLN
30.0000 mg | SUBCUTANEOUS | Status: DC
  Administered 2013-12-02: 30 mg via SUBCUTANEOUS
  Filled 2013-12-02: qty 0.3

## 2013-12-02 MED ORDER — SENNOSIDES-DOCUSATE SODIUM 8.6-50 MG PO TABS: 1.0000 | ORAL_TABLET | Freq: Every evening | ORAL | Status: DC | PRN

## 2013-12-02 MED ORDER — ZOLPIDEM TARTRATE 5 MG PO TABS: 5.0000 mg | ORAL_TABLET | Freq: Every evening | ORAL | Status: DC | PRN

## 2013-12-02 MED ORDER — STROKE: EARLY STAGES OF RECOVERY BOOK
Freq: Once | Status: AC
  Administered 2013-12-02: 22:00:00
  Filled 2013-12-02 (×2): qty 1

## 2013-12-02 MED ORDER — DOXYLAMINE SUCCINATE (SLEEP) 25 MG PO TABS
25.0000 mg | ORAL_TABLET | Freq: Every evening | ORAL | Status: DC | PRN
  Filled 2013-12-02: qty 1

## 2013-12-02 MED ORDER — LISINOPRIL 5 MG PO TABS
5.0000 mg | ORAL_TABLET | Freq: Every day | ORAL | Status: DC
  Administered 2013-12-02: 5 mg via ORAL
  Filled 2013-12-02 (×2): qty 1

## 2013-12-02 MED ORDER — ASPIRIN 300 MG RE SUPP: 300.0000 mg | Freq: Every day | RECTAL | Status: DC

## 2013-12-02 NOTE — Code Documentation (Signed)
Patient at home this afternoon with his wife.  At 1615 he suddenly had a complete loss of hearing, then was unable to find his words and had slurred speech.  His wife brought him to the hospital via private vehicle.  Upon arrival to he was able to hear clearly, he was still having trouble finding his words.  Stat head CT done, labs drawn.  Upon my assessment patient is teary and fearful.  He was able to calm down and complete the assessment.  NIHSS1 Facial droop.  Patient speaking fluently and clearly.  Dr Nicole Kindred at bedside to assess patient.  He states that he has a history of A Flutter and ablation x2.  Patient states that he never notice palpitations.  He said he noted the irregular heart rate when he was wearing his heart rate monitor during his bike rides.  But has not noticed any rapid rates in the past year.

## 2013-12-02 NOTE — Progress Notes (Signed)
Pt transferred to unit via ED. Pt alert and oriented upon arrival. Pt connected to telemetry and central monitoring notified. No signs or symptoms of acute distress. No complaints of pain or discomfort. Pt oriented to unit and room. Pt resting in bed at lowest position with call light in reach. Wife at beside. Will continue to monitor. Fortino Sic, RN, BSN 12/02/2013 8:24 PM

## 2013-12-02 NOTE — H&P (Signed)
[facility]                                                                                    Patient Demographics  [name], is a 73 y.o. male  CSN: [id number]  MRN: [id number]  DOB - [birth year]  Admit Date - 12/02/2013  Outpatient Primary MD for the patient is Odette Fraction, MD   With History of -  Past Medical History  Diagnosis Date  . Hypertrophic cardiomyopathy   . Diverticulosis   . Hemorrhoids   . BPH (benign prostatic hyperplasia)   . TIA (transient ischemic attack) ~ 2011    denies residual (12/11/2011)  . Hypertension   . Hyperlipidemia   . Basal cell carcinoma of cheek ~ 1973    left  . Atrial flutter     s/p CTI ablation by Dr Rayann Heman 10/12, 9/13      Past Surgical History  Procedure Laterality Date  . Basal cell carcinoma excision  ~ 1973    left cheek  . Total hip arthroplasty      left  . Atrial ablation surgery  10/12, 9/13 (repeat)    CTI ablation for atrial flutter by Dr Rayann Heman  . Cardiac catheterization  1960's  . A flutter ablation  12/11/2011  . Tonsillectomy and adenoidectomy  1947  . Joint replacement    . Hernia repair  ~ 3009    umbilical    in for   Chief Complaint  Patient presents with  . Code Stroke     HPI  [name]  is a 73 y.o. male, with past medical history significant for atrial flutter status post ablation and hypertrophic cardiomyopathy status post ablation by Dr. Rayann Heman x2 and TIA presenting today with the acute onset of slurring of speech and the deafness that lasted for 45 minutes and resolved upon arrival to the emergency room. The patient has history of TIA in the past which affected years ago. Patient feels is back to normal now . Patient exercised today and that is normal ADLs without any problem until this episode. Neurology saw the patient in the ER, CT of the head was negative.   Review of Systems    In addition to the HPI above,  No Fever-chills, No Headache, No changes with  Vision  No problems swallowing food or Liquids, No Chest pain, Cough or Shortness of Breath, No Abdominal pain, No Nausea or Vommitting, Bowel movements are regular, No Blood in stool or Urine, No dysuria, No new skin rashes or bruises, No new joints pains-aches,  No new weakness, tingling, numbness in any extremity, No recent weight gain or loss, No polyuria, polydypsia or polyphagia, No significant Mental Stressors.  A full 10 point Review of Systems was done, except as stated above, all other Review of Systems were negative.   Social History History  Substance Use Topics  . Smoking status: Former Smoker -- 2.00 packs/day for 10 years    Types: Cigarettes    Quit date: 03/17/1970  . Smokeless tobacco: Never Used  . Alcohol Use: Yes     Comment: quit 1995     Family History Family History  Problem Relation Age of Onset  . Colon polyps Brother   . Tuberculosis Brother     and father  . Breast cancer Mother      Prior to Admission medications   Medication Sig Start Date End Date Taking? Authorizing Provider  doxylamine, Sleep, (UNISOM) 25 MG tablet Take 25 mg by mouth at bedtime as needed.   Yes Historical Provider, MD  ezetimibe-simvastatin (VYTORIN) 10-40 MG per tablet Take 1 tablet by mouth at bedtime. 04/29/13  Yes Thompson Grayer, MD  lisinopril (PRINIVIL,ZESTRIL) 10 MG tablet Take 0.5 tablets (5 mg total) by mouth daily. 04/29/13  Yes Thompson Grayer, MD    Allergies  Allergen Reactions  . Penicillins Other (See Comments)    RXN: unknown; "told I might have allergy 1960's"    Physical Exam  Vitals  Blood pressure 138/88, pulse 66, resp. rate 19, SpO2 100.00%.   1. General white American male in no acute distress  2. Normal affect and insight, Not Suicidal or Homicidal, Awake Alert, Oriented X 3.  3. No F.N deficits, ALL C.Nerves Intact, Strength 5/5 all 4 extremities, Sensation intact all 4 extremities, Plantars down going.  4. Ears and Eyes appear Normal,  Conjunctivae clear, PERRLA. Moist Oral Mucosa.  5. Supple Neck, No JVD, No cervical lymphadenopathy appriciated, No Carotid Bruits.  6. Symmetrical Chest wall movement, Good air movement bilaterally, CTAB.  7. RRR, No Gallops, Rubs or Murmurs, No Parasternal Heave.  8. Positive Bowel Sounds, Abdomen Soft, Non tender, No organomegaly appriciated,No rebound -guarding or rigidity.  9.  No Cyanosis, Normal Skin Turgor, No Skin Rash or Bruise.  10. Good muscle tone,  joints appear normal , no effusions, Normal ROM.  11. No Palpable Lymph Nodes in Neck or Axillae    Data Review  CBC  Recent Labs Lab 12/02/13 1701 12/02/13 1707  WBC 10.6*  --   HGB 13.9 14.6  HCT 41.5 43.0  PLT 193  --   MCV 90.6  --   MCH 30.3  --   MCHC 33.5  --   RDW 12.6  --   LYMPHSABS 3.3  --   MONOABS 0.9  --   EOSABS 0.2  --   BASOSABS 0.0  --    ------------------------------------------------------------------------------------------------------------------  Chemistries   Recent Labs Lab 12/02/13 1701 12/02/13 1707  NA 139 138  K 5.0 4.7  CL 104 107  CO2 20  --   GLUCOSE 91 90  BUN 31* 33*  CREATININE 1.41* 1.50*  CALCIUM 9.2  --   AST 37  --   ALT 22  --   ALKPHOS 30*  --   BILITOT 0.3  --    ------------------------------------------------------------------------------------------------------------------ CrCl is unknown because both a height and weight (above a minimum accepted value) are required for this calculation. ------------------------------------------------------------------------------------------------------------------ No results found for this basename: TSH, T4TOTAL, FREET3, T3FREE, THYROIDAB,  in the last 72 hours   Coagulation profile  Recent Labs Lab 12/02/13 1701  INR 1.09   ------------------------------------------------------------------------------------------------------------------- No results found for this basename: DDIMER,  in the last 72  hours -------------------------------------------------------------------------------------------------------------------  Cardiac Enzymes No results found for this basename: CK, CKMB, TROPONINI, MYOGLOBIN,  in the last 168 hours ------------------------------------------------------------------------------------------------------------------ No components found with this basename: POCBNP,    ---------------------------------------------------------------------------------------------------------------  Urinalysis    Component Value Date/Time   COLORURINE YELLOW 12/02/2013 [place] 12/02/2013 1820   LABSPEC 1.023 12/02/2013 1820   PHURINE 5.5 12/02/2013 [place] 12/02/2013 1820   HGBUR  NEGATIVE 12/02/2013 1820   BILIRUBINUR NEGATIVE 12/02/2013 1820   KETONESUR 15* 12/02/2013 [place] 12/02/2013 1820   UROBILINOGEN 0.2 12/02/2013 1820   NITRITE NEGATIVE 12/02/2013 1820   LEUKOCYTESUR NEGATIVE 12/02/2013 1820    ----------------------------------------------------------------------------------------------------------------  .   Imaging results:   Ct Head Wo Contrast  12/02/2013   CLINICAL DATA:  Code Stroke.  ; severe headache  EXAM: CT HEAD WITHOUT CONTRAST  TECHNIQUE: Contiguous axial images were obtained from the base of the skull through the vertex without intravenous contrast.  COMPARISON:  Noncontrast CT scan of the brain dated February 27, 2010  FINDINGS: The ventricles are normal in size and position. There is no intracranial hemorrhage nor intracranial mass effect. There is no evidence of acute ischemic change. The cerebellum and brainstem are unremarkable.  There is small amount of fluid within right sphenoid sinus cells. There is minimal mucoperiosteal thickening within ethmoid sinus cells. There is no acute skull fracture nor lytic or blastic calvarial lesion.  IMPRESSION: 1. There is no acute intracranial hemorrhage nor evidence of  acute ischemic change. No intracranial mass effect or hydrocephalus is demonstrated. 2. Fluid in the sphenoid sinuses is worrisome for acute sinusitis. There is also mild ethmoid sinus inflammation. 3. These results were called by telephone at the time of interpretation on 12/02/2013 at 5:07 pm to Dr. Wallie Char, who verbally acknowledged these results.   Electronically Signed   By: David  Martinique   On: 12/02/2013 17:07   Mr Jodene Nam Head Wo Contrast  12/02/2013   CLINICAL DATA:  Sudden onset of hearing loss and speech disturbance.  EXAM: MRI HEAD WITHOUT CONTRAST  MRA HEAD WITHOUT CONTRAST  TECHNIQUE: Multiplanar, multiecho pulse sequences of the brain and surrounding structures were obtained without intravenous contrast. Angiographic images of the head were obtained using MRA technique without contrast.  COMPARISON:  CT ear earlier same day.  MRI 05/01/2009  FINDINGS: MRI HEAD FINDINGS  Diffusion imaging does not show any acute or subacute infarction. The brainstem is normal. There is an old infarction within the inferior cerebellum on the right, though this was not present in February of 2011. The cerebral hemispheres show mild chronic appearing small vessel ischemic change within the white matter. No cortical infarction. No mass lesion, hemorrhage, hydrocephalus or extra-axial collection. No pituitary mass. No significant sinus disease. No skull or skullbase lesion.  MRA HEAD FINDINGS  Both internal carotid arteries are widely patent into the brain. The anterior and middle cerebral vessels are patent without proximal stenosis, aneurysm or vascular malformation. There is atherosclerotic narrowing and irregularity of the more distal branch vessels, particularly in a left temporal MCA branch.  Both vertebral arteries are widely patent to the basilar. No basilar stenosis. Posterior circulation branch vessels are patent. Flow in the right superior cerebellar artery is not as well seen as that on the left. There  appears to be distal vessel irregularity particularly in the right PCA branches.  IMPRESSION: No acute infarction.  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 This was not present in 2011. Minimal small vessel change of the cerebral hemispheric white matter.  No major vessel occlusion or correctable proximal stenosis. Some distal vessel atherosclerotic irregularity, particularly notable in the distal right PCA branches and in a left temporal MCA branch.   Electronically Signed   By: Nelson Chimes M.D.   On: 12/02/2013 19:49   Mr Brain Wo Contrast  12/02/2013   CLINICAL DATA:  Sudden onset of hearing loss and speech  disturbance.  EXAM: MRI HEAD WITHOUT CONTRAST  MRA HEAD WITHOUT CONTRAST  TECHNIQUE: Multiplanar, multiecho pulse sequences of the brain and surrounding structures were obtained without intravenous contrast. Angiographic images of the head were obtained using MRA technique without contrast.  COMPARISON:  CT ear earlier same day.  MRI 05/01/2009  FINDINGS: MRI HEAD FINDINGS  Diffusion imaging does not show any acute or subacute infarction. The brainstem is normal. There is an old infarction within the inferior cerebellum on the right, though this was not present in February of 2011. The cerebral hemispheres show mild chronic appearing small vessel ischemic change within the white matter. No cortical infarction. No mass lesion, hemorrhage, hydrocephalus or extra-axial collection. No pituitary mass. No significant sinus disease. No skull or skullbase lesion.  MRA HEAD FINDINGS  Both internal carotid arteries are widely patent into the brain. The anterior and middle cerebral vessels are patent without proximal stenosis, aneurysm or vascular malformation. There is atherosclerotic narrowing and irregularity of the more distal branch vessels, particularly in a left temporal MCA branch.  Both vertebral arteries are widely patent to the basilar. No basilar stenosis. Posterior circulation branch vessels are  patent. Flow in the right superior cerebellar artery is not as well seen as that on the left. There appears to be distal vessel irregularity particularly in the right PCA branches.  IMPRESSION: No acute infarction.  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 This was not present in 2011. Minimal small vessel change of the cerebral hemispheric white matter.  No major vessel occlusion or correctable proximal stenosis. Some distal vessel atherosclerotic irregularity, particularly notable in the distal right PCA branches and in a left temporal MCA branch.   Electronically Signed   By: Nelson Chimes M.D.   On: 12/02/2013 19:49    My personal review of EKG: Normal sinus rhythm with first degree AV block, right bundle branch block and old inferior MI .    Assessment & Plan  1. TIA, recurrent      MRI, MRA of the brain are negative     Check echocardiogram     Check carotid Dopplers      increase aspirin to 325 mg by mouth daily      Neurochecks 2. Sphenoid sinusitis     Afebrile     Flonase 3. A. Fib/flutter     Status post ablation x2 4. Cardiomyopathy        DVT Prophylaxis Lovenox  AM Labs Ordered, also please review Full Orders  Family Communication: Admission, patients condition and plan of care including tests being ordered have been discussed with the patient and wife who indicate understanding and agree with the plan and Code Status.  Code Status full  Disposition Plan: Home  Time spent in minutes : 37 minutes  Condition GUARDED   @SIGNATURE @

## 2013-12-02 NOTE — ED Provider Notes (Signed)
CSN: [id number]     Arrival date & time 12/02/13  1641 History   First MD Initiated Contact with Patient 12/02/13 1657     Chief Complaint  Patient presents with  . Code Stroke     (Consider location/radiation/quality/duration/timing/severity/associated sxs/prior Treatment) HPI Comments: Patient presents with speech deficits.Marland Kitchen4:15 he told his wife that he couldn't hear. He also was having trouble getting his words out. He denied any trouble ambulating. He denies any numbness or weakness in his extremities. He has a history of a TIA in the past about 4 years ago which presented with numbness to the left side of his face and speech deficit. He does have a history of atrial flutter status post ablation in 2012 and 2013. He's currently on a baby aspirin a day. Since she's been in the ED, he feels like his symptoms have improved.   Past Medical History  Diagnosis Date  . Hypertrophic cardiomyopathy   . Diverticulosis   . Hemorrhoids   . BPH (benign prostatic hyperplasia)   . TIA (transient ischemic attack) ~ 2011    denies residual (12/11/2011)  . Hypertension   . Hyperlipidemia   . Basal cell carcinoma of cheek ~ 1973    left  . Atrial flutter     s/p CTI ablation by Dr Rayann Heman 10/12, 9/13   Past Surgical History  Procedure Laterality Date  . Basal cell carcinoma excision  ~ 1973    left cheek  . Total hip arthroplasty      left  . Atrial ablation surgery  10/12, 9/13 (repeat)    CTI ablation for atrial flutter by Dr Rayann Heman  . Cardiac catheterization  1960's  . A flutter ablation  12/11/2011  . Tonsillectomy and adenoidectomy  1947  . Joint replacement    . Hernia repair  ~ 4098    umbilical   Family History  Problem Relation Age of Onset  . Colon polyps Brother   . Tuberculosis Brother     and father  . Breast cancer Mother    History  Substance Use Topics  . Smoking status: Former Smoker -- 2.00 packs/day for 10 years    Types: Cigarettes    Quit date: 03/17/1970  .  Smokeless tobacco: Never Used  . Alcohol Use: Yes     Comment: quit 1995    Review of Systems  Constitutional: Negative for fever, chills, diaphoresis and fatigue.  HENT: Positive for hearing loss. Negative for congestion, rhinorrhea and sneezing.   Eyes: Negative.   Respiratory: Negative for cough, chest tightness and shortness of breath.   Cardiovascular: Negative for chest pain and leg swelling.  Gastrointestinal: Negative for nausea, vomiting, abdominal pain, diarrhea and blood in stool.  Genitourinary: Negative for frequency, hematuria, flank pain and difficulty urinating.  Musculoskeletal: Negative for arthralgias and back pain.  Skin: Negative for rash.  Neurological: Positive for speech difficulty. Negative for dizziness, weakness, numbness and headaches.      Allergies  Penicillins  Home Medications   Prior to Admission medications   Medication Sig Start Date End Date Taking? Authorizing Provider  doxylamine, Sleep, (UNISOM) 25 MG tablet Take 25 mg by mouth at bedtime as needed.   Yes Historical Provider, MD  ezetimibe-simvastatin (VYTORIN) 10-40 MG per tablet Take 1 tablet by mouth at bedtime. 04/29/13  Yes Thompson Grayer, MD  lisinopril (PRINIVIL,ZESTRIL) 10 MG tablet Take 0.5 tablets (5 mg total) by mouth daily. 04/29/13  Yes Thompson Grayer, MD   BP 106/66  Pulse 52  Temp(Src) 97.7 F (36.5 C) (Oral)  Resp 16  Ht 5\' 10"  (1.778 m)  Wt 168 lb (76.204 kg)  BMI 24.11 kg/m2  SpO2 97% Physical Exam  Constitutional: He is oriented to person, place, and time. He appears well-developed and well-nourished.  HENT:  Head: Normocephalic and atraumatic.  Eyes: Pupils are equal, round, and reactive to light.  Neck: Normal range of motion. Neck supple.  Cardiovascular: Normal rate, regular rhythm and normal heart sounds.   Pulmonary/Chest: Effort normal and breath sounds normal. No respiratory distress. He has no wheezes. He has no rales. He exhibits no tenderness.  Abdominal:  Soft. Bowel sounds are normal. There is no tenderness. There is no rebound and no guarding.  Musculoskeletal: Normal range of motion. He exhibits no edema.  Lymphadenopathy:    He has no cervical adenopathy.  Neurological: He is alert and oriented to person, place, and time.  Patient has some question mild right facial drooping. He has normal sensation to the face. He has normal tongue protrusion. He has normal extraocular eye movements. No visual field deficits were noted. He has normal shoulder shrug. Motor 5 out of 5 all extremities with no drift. Sensation is normal to light touch and pinprick in all extremities and to the face. Finger to nose intact.  Skin: Skin is warm and dry. No rash noted.  Psychiatric: He has a normal mood and affect.    ED Course  Procedures (including critical care time) Labs Review Labs Reviewed  CBC - Abnormal; Notable for the following:    WBC 10.6 (*)    All other components within normal limits  COMPREHENSIVE METABOLIC PANEL - Abnormal; Notable for the following:    BUN 31 (*)    Creatinine, Ser 1.41 (*)    Alkaline Phosphatase 30 (*)    GFR calc non Af Amer 48 (*)    GFR calc Af Amer 56 (*)    All other components within normal limits  URINALYSIS, ROUTINE W REFLEX MICROSCOPIC - Abnormal; Notable for the following:    Ketones, ur 15 (*)    All other components within normal limits  I-STAT CHEM 8, ED - Abnormal; Notable for the following:    BUN 33 (*)    Creatinine, Ser 1.50 (*)    Calcium, Ion 1.12 (*)    All other components within normal limits  ETHANOL  PROTIME-INR  APTT  DIFFERENTIAL  URINE RAPID DRUG SCREEN (HOSP PERFORMED)  HEMOGLOBIN A1C  LIPID PANEL  I-STAT TROPOININ, ED  Randolm Idol, ED    Imaging Review Dg Chest 2 View  12/02/2013   CLINICAL DATA:  CVA.  History of smoking.  EXAM: CHEST  2 VIEW  COMPARISON:  Chest radiograph from 04/30/2009  FINDINGS: The lungs are well-aerated and clear. There is no evidence of focal  opacification, pleural effusion or pneumothorax.  The heart is normal in size; the mediastinal contour is within normal limits. No acute osseous abnormalities are seen.  IMPRESSION: No acute cardiopulmonary process seen.   Electronically Signed   By: Garald Balding M.D.   On: 12/02/2013 22:31   Ct Head Wo Contrast  12/02/2013   CLINICAL DATA:  Code Stroke.  ; severe headache  EXAM: CT HEAD WITHOUT CONTRAST  TECHNIQUE: Contiguous axial images were obtained from the base of the skull through the vertex without intravenous contrast.  COMPARISON:  Noncontrast CT scan of the brain dated February 27, 2010  FINDINGS: The ventricles are normal in size and position. There is  no intracranial hemorrhage nor intracranial mass effect. There is no evidence of acute ischemic change. The cerebellum and brainstem are unremarkable.  There is small amount of fluid within right sphenoid sinus cells. There is minimal mucoperiosteal thickening within ethmoid sinus cells. There is no acute skull fracture nor lytic or blastic calvarial lesion.  IMPRESSION: 1. There is no acute intracranial hemorrhage nor evidence of acute ischemic change. No intracranial mass effect or hydrocephalus is demonstrated. 2. Fluid in the sphenoid sinuses is worrisome for acute sinusitis. There is also mild ethmoid sinus inflammation. 3. These results were called by telephone at the time of interpretation on 12/02/2013 at 5:07 pm to Dr. Wallie Char, who verbally acknowledged these results.   Electronically Signed   By: David  Martinique   On: 12/02/2013 17:07   Mr Jodene Nam Head Wo Contrast  12/02/2013   CLINICAL DATA:  Sudden onset of hearing loss and speech disturbance.  EXAM: MRI HEAD WITHOUT CONTRAST  MRA HEAD WITHOUT CONTRAST  TECHNIQUE: Multiplanar, multiecho pulse sequences of the brain and surrounding structures were obtained without intravenous contrast. Angiographic images of the head were obtained using MRA technique without contrast.  COMPARISON:  CT  ear earlier same day.  MRI 05/01/2009  FINDINGS: MRI HEAD FINDINGS  Diffusion imaging does not show any acute or subacute infarction. The brainstem is normal. There is an old infarction within the inferior cerebellum on the right, though this was not present in February of 2011. The cerebral hemispheres show mild chronic appearing small vessel ischemic change within the white matter. No cortical infarction. No mass lesion, hemorrhage, hydrocephalus or extra-axial collection. No pituitary mass. No significant sinus disease. No skull or skullbase lesion.  MRA HEAD FINDINGS  Both internal carotid arteries are widely patent into the brain. The anterior and middle cerebral vessels are patent without proximal stenosis, aneurysm or vascular malformation. There is atherosclerotic narrowing and irregularity of the more distal branch vessels, particularly in a left temporal MCA branch.  Both vertebral arteries are widely patent to the basilar. No basilar stenosis. Posterior circulation branch vessels are patent. Flow in the right superior cerebellar artery is not as well seen as that on the left. There appears to be distal vessel irregularity particularly in the right PCA branches.  IMPRESSION: No acute infarction.  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 This was not present in 2011. Minimal small vessel change of the cerebral hemispheric white matter.  No major vessel occlusion or correctable proximal stenosis. Some distal vessel atherosclerotic irregularity, particularly notable in the distal right PCA branches and in a left temporal MCA branch.   Electronically Signed   By: Nelson Chimes M.D.   On: 12/02/2013 19:49   Mr Brain Wo Contrast  12/02/2013   CLINICAL DATA:  Sudden onset of hearing loss and speech disturbance.  EXAM: MRI HEAD WITHOUT CONTRAST  MRA HEAD WITHOUT CONTRAST  TECHNIQUE: Multiplanar, multiecho pulse sequences of the brain and surrounding structures were obtained without intravenous contrast.  Angiographic images of the head were obtained using MRA technique without contrast.  COMPARISON:  CT ear earlier same day.  MRI 05/01/2009  FINDINGS: MRI HEAD FINDINGS  Diffusion imaging does not show any acute or subacute infarction. The brainstem is normal. There is an old infarction within the inferior cerebellum on the right, though this was not present in February of 2011. The cerebral hemispheres show mild chronic appearing small vessel ischemic change within the white matter. No cortical infarction. No mass lesion, hemorrhage, hydrocephalus or extra-axial  collection. No pituitary mass. No significant sinus disease. No skull or skullbase lesion.  MRA HEAD FINDINGS  Both internal carotid arteries are widely patent into the brain. The anterior and middle cerebral vessels are patent without proximal stenosis, aneurysm or vascular malformation. There is atherosclerotic narrowing and irregularity of the more distal branch vessels, particularly in a left temporal MCA branch.  Both vertebral arteries are widely patent to the basilar. No basilar stenosis. Posterior circulation branch vessels are patent. Flow in the right superior cerebellar artery is not as well seen as that on the left. There appears to be distal vessel irregularity particularly in the right PCA branches.  IMPRESSION: No acute infarction.  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 This was not present in 2011. Minimal small vessel change of the cerebral hemispheric white matter.  No major vessel occlusion or correctable proximal stenosis. Some distal vessel atherosclerotic irregularity, particularly notable in the distal right PCA branches and in a left temporal MCA branch.   Electronically Signed   By: Nelson Chimes M.D.   On: 12/02/2013 19:49     EKG Interpretation   Date/Time:  Friday December 02 2013 16:46:27 EDT Ventricular Rate:  79 PR Interval:  270 QRS Duration: 140 QT Interval:  404 QTC Calculation: 463 R Axis:   -78 Text  Interpretation:  Sinus rhythm with 1st degree A-V block with  Premature atrial complexes with Abberant conduction Left axis deviation  Right bundle branch block Possible Lateral infarct , age undetermined  Inferior infarct , age undetermined Abnormal ECG Confirmed by Caroll Cunnington  MD,  Barack Nicodemus ([id number]) on 12/03/2013 12:20:03 AM      MDM   Final diagnoses:  Transient cerebral ischemia, unspecified transient cerebral ischemia type  Hypertension, essential  Atrial flutter, unspecified  Hx-TIA (transient ischemic attack)  Hypertrophic nonobstructive cardiomyopathy    Patient was seen by Dr. Tressie Ellis. He did not meet criteria for tPA given that his symptoms have resolved. He will go ahead and be admitted to the hospitalist service for further stroke workup.    Malvin Johns, MD 12/03/13 [phone]

## 2013-12-02 NOTE — ED Notes (Signed)
The patient's wife noticed first her husband could not hear, then he was not forming his words.  On the way to the hospital he said the signs "were not speaking to him".

## 2013-12-02 NOTE — Consult Note (Signed)
Referring Physician: BELFI, M    Chief Complaint: Acute loss of hearing, and speech difficulty.  HPI: [name] is an 73 y.o. male with HTN, HLD, TIA and hypertrophic cardiomyopathy, who experienced the sudden onset of hearing loss as speech difficulty at 2 today. Has been on asprin daily. CT showed no acute intracranial changes. NIH score was 1. Deficits resolved except for equivocal mild facial weakness.  LSN: [id number] on 12/02/13 tPA Given: No: resolving deficits mRankin:  Past Medical History  Diagnosis Date  . Hypertrophic cardiomyopathy   . Diverticulosis   . Hemorrhoids   . BPH (benign prostatic hyperplasia)   . TIA (transient ischemic attack) ~ 2011    denies residual (12/11/2011)  . Hypertension   . Hyperlipidemia   . Basal cell carcinoma of cheek ~ 1973    left  . Atrial flutter     s/p CTI ablation by Dr Rayann Heman 10/12, 9/13    Family History  Problem Relation Age of Onset  . Colon polyps Brother   . Tuberculosis Brother     and father  . Breast cancer Mother      Medications: I have reviewed the patient's current medications.  ROS: History obtained from spouse and the patient  General ROS: negative for - chills, fatigue, fever, night sweats, weight gain or weight loss Psychological ROS: negative for - behavioral disorder, hallucinations, memory difficulties, mood swings or suicidal ideation Ophthalmic ROS: negative for - blurry vision, double vision, eye pain or loss of vision ENT ROS: negative for - epistaxis, nasal discharge, oral lesions, sore throat, tinnitus or vertigo Allergy and Immunology ROS: negative for - hives or itchy/watery eyes Hematological and Lymphatic ROS: negative for - bleeding problems, bruising or swollen lymph nodes Endocrine ROS: negative for - galactorrhea, hair pattern changes, polydipsia/polyuria or temperature intolerance Respiratory ROS: negative for - cough, hemoptysis, shortness of breath or wheezing Cardiovascular ROS:  negative for - chest pain, dyspnea on exertion, edema or irregular heartbeat Gastrointestinal ROS: negative for - abdominal pain, diarrhea, hematemesis, nausea/vomiting or stool incontinence Genito-Urinary ROS: negative for - dysuria, hematuria, incontinence or urinary frequency/urgency Musculoskeletal ROS: negative for - joint swelling or muscular weakness Neurological ROS: as noted in HPI Dermatological ROS: negative for rash and skin lesion changes  Physical Examination: There were no vitals taken for this visit.  Neurologic Examination: Mental Status: Alert, oriented, thought content appropriate.  Speech fluent without evidence of aphasia. Able to follow commands without difficulty. Cranial Nerves: II-Visual fields were normal. III/IV/VI-Pupils were equal and reacted. Extraocular movements were full and conjugate.    V/VII-no facial numbness; equivocal mild rt facial weakness. VIII-normal. X-normal speech. Motor: 5/5 bilaterally with normal tone and bulk Sensory: Normal throughout. Deep Tendon Reflexes: 2+ and symmetric. Plantars: Flexor bilaterally Cerebellar: Normal finger-to-nose testing.  Ct Head Wo Contrast  12/02/2013   CLINICAL DATA:  Code Stroke.  ; severe headache  EXAM: CT HEAD WITHOUT CONTRAST  TECHNIQUE: Contiguous axial images were obtained from the base of the skull through the vertex without intravenous contrast.  COMPARISON:  Noncontrast CT scan of the brain dated February 27, 2010  FINDINGS: The ventricles are normal in size and position. There is no intracranial hemorrhage nor intracranial mass effect. There is no evidence of acute ischemic change. The cerebellum and brainstem are unremarkable.  There is small amount of fluid within right sphenoid sinus cells. There is minimal mucoperiosteal thickening within ethmoid sinus cells. There is no acute skull fracture nor lytic or blastic calvarial lesion.  IMPRESSION: 1. There is no acute intracranial hemorrhage nor  evidence of acute ischemic change. No intracranial mass effect or hydrocephalus is demonstrated. 2. Fluid in the sphenoid sinuses is worrisome for acute sinusitis. There is also mild ethmoid sinus inflammation. 3. These results were called by telephone at the time of interpretation on 12/02/2013 at 5:07 pm to Dr. Wallie Char, who verbally acknowledged these results.   Electronically Signed   By: David  Martinique   On: 12/02/2013 17:07    Assessment: 73 y.o. male presenting with prob recurrent TIA.  Stroke Risk Factors - hyperlipidemia and hypertension  Plan: 1. HgbA1c, fasting lipid panel 2. MRI, MRA  of the brain without contrast 3. PT consult, OT consult, Speech consult 4. Echocardiogram 5. Carotid dopplers 6. Prophylactic therapy-Antiplatelet med: Aspirin  7. Risk factor modification 8. Telemetry monitoring   C.R. Nicole Kindred, MD [facility] [phone]  12/02/2013, 5:15 PM

## 2013-12-03 DIAGNOSIS — I517 Cardiomegaly: Secondary | ICD-10-CM

## 2013-12-03 DIAGNOSIS — I519 Heart disease, unspecified: Secondary | ICD-10-CM

## 2013-12-03 DIAGNOSIS — I4892 Unspecified atrial flutter: Secondary | ICD-10-CM

## 2013-12-03 LAB — BASIC METABOLIC PANEL
Anion gap: 15 (ref 5–15)
BUN: 27 mg/dL — ABNORMAL HIGH (ref 6–23)
CO2: 22 mEq/L (ref 19–32)
Calcium: 8.7 mg/dL (ref 8.4–10.5)
Chloride: 106 mEq/L (ref 96–112)
Creatinine, Ser: 1.15 mg/dL (ref 0.50–1.35)
GFR calc Af Amer: 71 mL/min — ABNORMAL LOW (ref >90)
GFR calc non Af Amer: 61 mL/min — ABNORMAL LOW (ref >90)
Glucose, Bld: 79 mg/dL (ref 70–99)
Potassium: 5.4 mEq/L — ABNORMAL HIGH (ref 3.7–5.3)
Sodium: 143 mEq/L (ref 137–147)

## 2013-12-03 LAB — LIPID PANEL
Cholesterol: 128 mg/dL (ref 0–200)
HDL: 61 mg/dL (ref >39)
LDL Cholesterol: 54 mg/dL (ref 0–99)
Total CHOL/HDL Ratio: 2.1 RATIO
Triglycerides: 66 mg/dL (ref <150)
VLDL: 13 mg/dL (ref 0–40)

## 2013-12-03 LAB — HEMOGLOBIN A1C
Hgb A1c MFr Bld: 6.1 % — ABNORMAL HIGH (ref <5.7)
Mean Plasma Glucose: 128 mg/dL — ABNORMAL HIGH (ref <117)

## 2013-12-03 LAB — POTASSIUM: Potassium: 4.6 mEq/L (ref 3.7–5.3)

## 2013-12-03 MED ORDER — ASPIRIN 325 MG PO TABS: 325.0000 mg | ORAL_TABLET | Freq: Every day | ORAL | Status: DC

## 2013-12-03 MED ORDER — SODIUM CHLORIDE 0.9 % IV SOLN
INTRAVENOUS | Status: DC
  Administered 2013-12-03: 100 mL/h via INTRAVENOUS

## 2013-12-03 MED ORDER — APIXABAN 5 MG PO TABS: 5.0000 mg | ORAL_TABLET | Freq: Two times a day (BID) | ORAL | Status: DC

## 2013-12-03 MED ORDER — LISINOPRIL 5 MG PO TABS: 5.0000 mg | ORAL_TABLET | Freq: Every day | ORAL | Status: DC

## 2013-12-03 MED ORDER — APIXABAN 5 MG PO TABS
5.0000 mg | ORAL_TABLET | Freq: Two times a day (BID) | ORAL | Status: DC
  Administered 2013-12-03 – 2013-12-04 (×2): 5 mg via ORAL
  Filled 2013-12-03 (×2): qty 1

## 2013-12-03 MED ORDER — ASPIRIN 300 MG RE SUPP: 300.0000 mg | Freq: Every day | RECTAL | Status: DC

## 2013-12-03 NOTE — Progress Notes (Signed)
[facility] PROGRESS NOTE  [name] NFA:[id number] DOB: 02-Oct-1940 DOA: 12/02/2013 PCP: Odette Fraction, MD  Assessment/Plan: 1-TIA;  MRI showed prior stroke. No acute stroke.  Started on eliquis due to prior history of A flutter.   2-Cardiomyopathy, worsening EF, now 25 to 30 % on ECHO. Appears compensated. Cardiology consulted.   3-HTN; lisinopril was on hold due to mild renal insufficiency. Might need to be resume now EF is worse.  4-HLD; continue with vytorin.   Code Status: Full Code.  Family Communication: care discussed with wife.  Disposition Plan: Remain in hosital   Consultants:  Cardiology  neurology  Procdures: ECHO; Left ventricle: Wall thickness was increased in a pattern of moderate LVH. Systolic function was severely reduced. The estimated ejection fraction was in the range of 25% to 30%. Diffuse hypokinesis. - Right ventricle: The cavity size was mildly dilated. Systolic function was mildly reduced. - Right atrium: The atrium was moderately dilated.    Antibiotics:  none  HPI/Subjective: Feeling well, no more slurred speech.   Objective: Filed Vitals:   12/03/13 1818  BP: 122/69  Pulse: 66  Temp: 97.9 F (36.6 C)  Resp: 16   No intake or output data in the 24 hours ending 12/03/13 1832 Filed Weights   12/02/13 2200  Weight: 76.204 kg (168 lb)    Exam:   General:  No distress.   Cardiovascular: S 1 S 2 RRR  Respiratory: CTA  Abdomen: Bs present, soft, NT  Musculoskeletal:no edema  Neuro non focal.    Data Reviewed: Basic Metabolic Panel:  Recent Labs Lab 12/02/13 1701 12/02/13 1707 12/03/13 0452 12/03/13 1510  NA 139 138 143  --   K 5.0 4.7 5.4* 4.6  CL 104 107 106  --   CO2 20  --  22  --   GLUCOSE 91 90 79  --   BUN 31* 33* 27*  --   CREATININE 1.41* 1.50* 1.15  --   CALCIUM 9.2  --  8.7  --    Liver Function Tests:  Recent Labs Lab 12/02/13 1701  AST 37  ALT 22  ALKPHOS 30*  BILITOT  0.3  PROT 6.6  ALBUMIN 4.0   No results found for this basename: LIPASE, AMYLASE,  in the last 168 hours No results found for this basename: AMMONIA,  in the last 168 hours CBC:  Recent Labs Lab 12/02/13 1701 12/02/13 1707  WBC 10.6*  --   NEUTROABS 6.2  --   HGB 13.9 14.6  HCT 41.5 43.0  MCV 90.6  --   PLT 193  --    Cardiac Enzymes: No results found for this basename: CKTOTAL, CKMB, CKMBINDEX, TROPONINI,  in the last 168 hours BNP (last 3 results) No results found for this basename: PROBNP,  in the last 8760 hours CBG: No results found for this basename: GLUCAP,  in the last 168 hours  No results found for this or any previous visit (from the past 240 hour(s)).   Studies: Dg Chest 2 View  12/02/2013   CLINICAL DATA:  CVA.  History of smoking.  EXAM: CHEST  2 VIEW  COMPARISON:  Chest radiograph from 04/30/2009  FINDINGS: The lungs are well-aerated and clear. There is no evidence of focal opacification, pleural effusion or pneumothorax.  The heart is normal in size; the mediastinal contour is within normal limits. No acute osseous abnormalities are seen.  IMPRESSION: No acute cardiopulmonary process seen.   Electronically Signed   By: Jacqulynn Cadet  Chang M.D.   On: 12/02/2013 22:31   Ct Head Wo Contrast  12/02/2013   CLINICAL DATA:  Code Stroke.  ; severe headache  EXAM: CT HEAD WITHOUT CONTRAST  TECHNIQUE: Contiguous axial images were obtained from the base of the skull through the vertex without intravenous contrast.  COMPARISON:  Noncontrast CT scan of the brain dated February 27, 2010  FINDINGS: The ventricles are normal in size and position. There is no intracranial hemorrhage nor intracranial mass effect. There is no evidence of acute ischemic change. The cerebellum and brainstem are unremarkable.  There is small amount of fluid within right sphenoid sinus cells. There is minimal mucoperiosteal thickening within ethmoid sinus cells. There is no acute skull fracture nor lytic or  blastic calvarial lesion.  IMPRESSION: 1. There is no acute intracranial hemorrhage nor evidence of acute ischemic change. No intracranial mass effect or hydrocephalus is demonstrated. 2. Fluid in the sphenoid sinuses is worrisome for acute sinusitis. There is also mild ethmoid sinus inflammation. 3. These results were called by telephone at the time of interpretation on 12/02/2013 at 5:07 pm to Dr. Wallie Char, who verbally acknowledged these results.   Electronically Signed   By: David  [place]   On: 12/02/2013 17:07   Mr Jodene Nam Head Wo Contrast  12/02/2013   CLINICAL DATA:  Sudden onset of hearing loss and speech disturbance.  EXAM: MRI HEAD WITHOUT CONTRAST  MRA HEAD WITHOUT CONTRAST  TECHNIQUE: Multiplanar, multiecho pulse sequences of the brain and surrounding structures were obtained without intravenous contrast. Angiographic images of the head were obtained using MRA technique without contrast.  COMPARISON:  CT ear earlier same day.  MRI 05/01/2009  FINDINGS: MRI HEAD FINDINGS  Diffusion imaging does not show any acute or subacute infarction. The brainstem is normal. There is an old infarction within the inferior cerebellum on the right, though this was not present in February of 2011. The cerebral hemispheres show mild chronic appearing small vessel ischemic change within the white matter. No cortical infarction. No mass lesion, hemorrhage, hydrocephalus or extra-axial collection. No pituitary mass. No significant sinus disease. No skull or skullbase lesion.  MRA HEAD FINDINGS  Both internal carotid arteries are widely patent into the brain. The anterior and middle cerebral vessels are patent without proximal stenosis, aneurysm or vascular malformation. There is atherosclerotic narrowing and irregularity of the more distal branch vessels, particularly in a left temporal MCA branch.  Both vertebral arteries are widely patent to the basilar. No basilar stenosis. Posterior circulation branch vessels are  patent. Flow in the right superior cerebellar artery is not as well seen as that on the left. There appears to be distal vessel irregularity particularly in the right PCA branches.  IMPRESSION: No acute infarction.  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 This was not present in 2011. Minimal small vessel change of the cerebral hemispheric white matter.  No major vessel occlusion or correctable proximal stenosis. Some distal vessel atherosclerotic irregularity, particularly notable in the distal right PCA branches and in a left temporal MCA branch.   Electronically Signed   By: Nelson Chimes M.D.   On: 12/02/2013 19:49   Mr Brain Wo Contrast  12/02/2013   CLINICAL DATA:  Sudden onset of hearing loss and speech disturbance.  EXAM: MRI HEAD WITHOUT CONTRAST  MRA HEAD WITHOUT CONTRAST  TECHNIQUE: Multiplanar, multiecho pulse sequences of the brain and surrounding structures were obtained without intravenous contrast. Angiographic images of the head were obtained using MRA technique without contrast.  COMPARISON:  CT ear earlier same day.  MRI 05/01/2009  FINDINGS: MRI HEAD FINDINGS  Diffusion imaging does not show any acute or subacute infarction. The brainstem is normal. There is an old infarction within the inferior cerebellum on the right, though this was not present in February of 2011. The cerebral hemispheres show mild chronic appearing small vessel ischemic change within the white matter. No cortical infarction. No mass lesion, hemorrhage, hydrocephalus or extra-axial collection. No pituitary mass. No significant sinus disease. No skull or skullbase lesion.  MRA HEAD FINDINGS  Both internal carotid arteries are widely patent into the brain. The anterior and middle cerebral vessels are patent without proximal stenosis, aneurysm or vascular malformation. There is atherosclerotic narrowing and irregularity of the more distal branch vessels, particularly in a left temporal MCA branch.  Both vertebral  arteries are widely patent to the basilar. No basilar stenosis. Posterior circulation branch vessels are patent. Flow in the right superior cerebellar artery is not as well seen as that on the left. There appears to be distal vessel irregularity particularly in the right PCA branches.  IMPRESSION: No acute infarction.  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 This was not present in 2011. Minimal small vessel change of the cerebral hemispheric white matter.  No major vessel occlusion or correctable proximal stenosis. Some distal vessel atherosclerotic irregularity, particularly notable in the distal right PCA branches and in a left temporal MCA branch.   Electronically Signed   By: Nelson Chimes M.D.   On: 12/02/2013 19:49    Scheduled Meds: . apixaban  5 mg Oral BID  . ezetimibe-simvastatin  1 tablet Oral QHS   Continuous Infusions:   Active Problems:   TIA (transient ischemic attack)    Time spent: 25 minutes.     Niel Hummer A  [facility] Pager [phone]. If 7PM-7AM, please contact night-coverage at [email or web address], password [facility] 12/03/2013, 6:32 PM  LOS: 1 day

## 2013-12-03 NOTE — Progress Notes (Signed)
Utilization Review Completed.[name] T9/19/2015  

## 2013-12-03 NOTE — Progress Notes (Signed)
VASCULAR LAB PRELIMINARY  PRELIMINARY  PRELIMINARY  PRELIMINARY  Carotid Dopplers completed.    Preliminary report:  1-39% ICA stenosis.  Vertebral artery flow is antegrade.   Nykayla Marcelli, RVT 12/03/2013, 3:38 PM    '

## 2013-12-03 NOTE — Progress Notes (Signed)
Called cardiology office and left a message regarding the urgency of 2D echo result. Awaiting call back

## 2013-12-03 NOTE — Progress Notes (Signed)
  Echocardiogram 2D Echocardiogram has been performed.  [name] 12/03/2013, 3:52 PM

## 2013-12-03 NOTE — Progress Notes (Signed)
STROKE TEAM PROGRESS NOTE   HISTORY [name] is an 73 y.o. male with HTN, HLD, TIA, afib s/p ablation and hypertrophic cardiomyopathy, who experienced the sudden onset of hearing loss and speech difficulty at 33 today. Has been on asprin daily. CT showed no acute intracranial changes. NIH score was 1. Deficits resolved except for equivocal mild facial weakness.    LSN: [id number] on 12/02/13  tPA Given: No: resolving deficits  mRankin:    SUBJECTIVE (INTERVAL HISTORY) The patient's wife is at the bedside. The patient feels completely back to normal. We discussed TIAs and the results of his MRI which shows a remote infarct but nothing acute. The patient has had problems with renal insufficiency. I discussed this with his hospitalist and we will try holding his ACE inhibitor as his blood pressure has been mildly low. He apparently is a very active person who plays golf and rides a bicycle regularly. He has had ablation for aflutter and follow with Dr. Rayann Heman in Cardiology   OBJECTIVE Temp:  [97.5 F (36.4 C)-98.4 F (36.9 C)] 97.7 F (36.5 C) (09/19 0753) Pulse Rate:  [51-79] 63 (09/19 0753) Cardiac Rhythm:  [-] Heart block (09/18 2000) Resp:  [10-20] 18 (09/19 0753) BP: (106-145)/(63-88) 118/77 mmHg (09/19 0753) SpO2:  [97 %-100 %] 99 % (09/19 0753) Weight:  [168 lb (76.204 kg)] 168 lb (76.204 kg) (09/18 2200)  No results found for this basename: GLUCAP,  in the last 168 hours  Recent Labs Lab 12/02/13 1701 12/02/13 1707  NA 139 138  K 5.0 4.7  CL 104 107  CO2 20  --   GLUCOSE 91 90  BUN 31* 33*  CREATININE 1.41* 1.50*  CALCIUM 9.2  --     Recent Labs Lab 12/02/13 1701  AST 37  ALT 22  ALKPHOS 30*  BILITOT 0.3  PROT 6.6  ALBUMIN 4.0    Recent Labs Lab 12/02/13 1701 12/02/13 1707  WBC 10.6*  --   NEUTROABS 6.2  --   HGB 13.9 14.6  HCT 41.5 43.0  MCV 90.6  --   PLT 193  --    No results found for this basename: CKTOTAL, CKMB, CKMBINDEX, TROPONINI,  in  the last 168 hours  Recent Labs  12/02/13 1701  LABPROT 14.1  INR 1.09    Recent Labs  12/02/13 1820  COLORURINE YELLOW  LABSPEC 1.023  PHURINE 5.5  GLUCOSEU NEGATIVE  HGBUR NEGATIVE  BILIRUBINUR NEGATIVE  KETONESUR 15*  PROTEINUR NEGATIVE  UROBILINOGEN 0.2  NITRITE NEGATIVE  LEUKOCYTESUR NEGATIVE       Component Value Date/Time   CHOL 128 12/03/2013 0452   TRIG 66 12/03/2013 0452   HDL 61 12/03/2013 0452   CHOLHDL 2.1 12/03/2013 0452   VLDL 13 12/03/2013 0452   LDLCALC 54 12/03/2013 0452   No results found for this basename: HGBA1C      Component Value Date/Time   LABOPIA NONE DETECTED 12/02/2013 1820   COCAINSCRNUR NONE DETECTED 12/02/2013 1820   LABBENZ NONE DETECTED 12/02/2013 1820   AMPHETMU NONE DETECTED 12/02/2013 1820   THCU NONE DETECTED 12/02/2013 1820   LABBARB NONE DETECTED 12/02/2013 1820     Recent Labs Lab 12/02/13 1701  ETH <11    Dg Chest 2 View 12/02/2013    No acute cardiopulmonary process seen.   Ct Head Wo Contrast 12/02/2013    1. There is no acute intracranial hemorrhage nor evidence of acute ischemic change. No intracranial mass effect or hydrocephalus is demonstrated.  2. Fluid in the sphenoid sinuses is worrisome for acute sinusitis. There is also mild ethmoid sinus inflammation.    Mri Mra Head Wo Contrast 12/02/2013    No acute infarction.   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 This was not present in 2011.  Minimal small vessel change of the cerebral hemispheric white matter.   No major vessel occlusion or correctable proximal stenosis.  Some distal vessel atherosclerotic irregularity, particularly notable in the distal right PCA branches and in a left temporal MCA branch.     2D echo 05/23/2013: Impressions:  - Normal LV size with mild asymmetric septal hypertrophy. EF 45-50% with septal and apical hypokinesis. Moderate diastolic dysfunction. No LV outflow tract gradient. There was choral but not mitral valve SAM.  Normal RV size and systolic function.    PHYSICAL EXAM  Neurologic Examination:  Mental Status:  Alert, oriented, thought content appropriate. Speech fluent without evidence of aphasia. Able to follow commands without difficulty.  Cranial Nerves:  II-Visual fields were normal.  III/IV/VI-Pupils were equal and reacted. Extraocular movements were full and conjugate.  V/VII-no facial numbness; equivocal mild rt facial weakness.  VIII-normal.  X-normal speech.  Motor: 5/5 bilaterally with normal tone and bulk  Sensory: Normal throughout.  Deep Tendon Reflexes: 2+ and symmetric.  Plantars: Flexor bilaterally  Cerebellar: Normal finger-to-nose testing.   ASSESSMENT/PLAN  [name] is a 73 y.o. male with history of hypertension, hyperlipidemia, previous TIA, previous atrial flutter status post ablation, and hypertrophic cardiomyopathy presenting with sudden onset of hearing and speech difficulties. He did not receive IV t-PA due to resolving deficits. Imaging confirms no acute infarct but old inferior cerebellar infarction on the right. Stroke work up underway.  Probable TIA:  left possibly embolic or possibly secondary to small vessel disease.   aspirin 81 mg orally every day prior to admission, now on aspirin 325 mg orally every day. For afib or aflutter pt, anti coagulation is standard therapy for patients after ablation with CHADS score >= 2. Suggest switching to Eliquis.  MRI  negative for acute infarct but positive for an old inferior cerebellar infarct on the right.  MRA  minimal small vessel change of the cerebral hemispheric white matter.  No major vessel occlusion or correctable proximal stenosis.   Carotid Doppler pending  2D Echo  Pending  LDL 54 - on Vytorin prior to admission and currently.  HgbA1c pending  Lovenox for VTE prophylaxis  Cardiac diet with thin liquids.   Up with assistance  Resultant - resolution of deficits.  Therapy needs:  Pending  Ongoing aggressive risk factor management  Risk factor education  Patient counseled to be compliant with his antithrombotic medications  Disposition:  Pending. Possibly discharge today if normal echo and Dopplers.  Hypertension   Home med - lisinopril - Resumed in hospital  BP (106-145)/(63-88) 118/77 mmHg (09/19 0753)  SBP goal - normotensive - although mildly low at times  Stable - although mildly low at times  Patient counseled to be compliant with his blood pressure medications  Hyperlipidemia  Home meds:  Vytorin. Resumed in hospital  LDL - 54  LDL goal < 100 (<70 for diabetics)  Diabetes - no history diabetes  Home meds: None  HgbA1c pending  Controlled  Goal < 7.0  Educated patient about lifestyle changes for prevention of diabetes  Other Stroke Risk Factors Advanced age   Hx stroke/TIA   Other Active Problems  Renal insufficiency and low BP - BUN 33 creatinine  1.5 - try holding ACE inhibitor. Follow up outpatient with primary care  Other Pertinent History  History of atrial flutter status post ablation - may need outpatient cardiac monitor or implantable loop  Hypertrophic cardiomyopathy  Mikey Bussing PA-C [facility] Pager [phone] 12/03/2013, 9:07 AM  Personally examined patient and images, agree with history, physical, neuro exam as stated above. Agree with assessment and plan.   Sarina Ill, MD [facility]   To contact Stroke Continuity provider, please refer to [email or web address] After hours, contact General Neurology

## 2013-12-03 NOTE — Care Management Note (Signed)
    Page 1 of 1   12/03/2013     3:53:20 PM CARE MANAGEMENT NOTE 12/03/2013  Patient:  GEORGI, NAVARRETE A   Account Number:  [id number]  Date Initiated:  12/03/2013  Documentation initiated by:  GRAVES-BIGELOW,Kathrene Sinopoli  Subjective/Objective Assessment:   Pt admitted for slurred speech. Hx of atrial flutter.     Action/Plan:   CM did call [facility] [address] and medication eliquis is available, however during test claim insurance rejected the medication. Unable to get cost. CM can provide pt with card and per MD pt will f/u with PCP for cost thereafter.   Anticipated DC Date:  12/03/2013   Anticipated DC Plan:  [place]  CM consult      Choice offered to / List presented to:             Status of service:  Completed, signed off Medicare Important Message given?  NO (If response is "NO", the following Medicare IM given date fields will be blank) Date Medicare IM given:   Medicare IM given by:   Date Additional Medicare IM given:   Additional Medicare IM given by:    Discharge Disposition:  HOME/SELF CARE  Per UR Regulation:  Reviewed for med. necessity/level of care/duration of stay  If discussed at [place] of Stay Meetings, dates discussed:    Comments:

## 2013-12-03 NOTE — Evaluation (Signed)
Physical Therapy Evaluation and DISCHARGE Patient Details Name: [name] MRN: [id number] DOB: May 07, 1940 Today's Date: 12/03/2013   History of Present Illness  Admitted with slurred speech and deafness x 45 min.  After arrival to ED, pt's symptoms resolved.  Pt feels normal, but testing continues.  Clinical Impression  Pt passed a dynamic gait assessment with no deviations.  Pt feels his mobility is at his normal level.  No further PT needs.  Signing off.    Follow Up Recommendations No PT follow up    Equipment Recommendations  None recommended by PT    Recommendations for Other Services       Precautions / Restrictions        Mobility  Bed Mobility Overal bed mobility: Independent                Transfers Overall transfer level: Independent Equipment used: None                Ambulation/Gait Ambulation/Gait assistance: Independent Ambulation Distance (Feet): 600 Feet Assistive device: None Gait Pattern/deviations: WFL(Within Functional Limits)   Gait velocity interpretation: >2.62 ft/sec, indicative of independent community ambulator General Gait Details: fluid and safe  Stairs Stairs: Yes Stairs assistance: Independent Stair Management: No rails;Alternating pattern;Forwards Number of Stairs: 4 General stair comments: safe and fluid  Wheelchair Mobility    Modified Rankin (Stroke Patients Only)       Balance Overall balance assessment: No apparent balance deficits (not formally assessed)                               Standardized Balance Assessment Standardized Balance Assessment : Dynamic Gait Index   Dynamic Gait Index Level Surface: Normal Change in Gait Speed: Normal Gait with Horizontal Head Turns: Normal Gait with Vertical Head Turns: Normal Gait and Pivot Turn: Normal Step Over Obstacle: Normal Step Around Obstacles: Normal Steps: Normal Total Score: 24       Pertinent Vitals/Pain Pain Assessment:  No/denies pain    Home Living Family/patient expects to be discharged to:: Private residence Living Arrangements: Spouse/significant other Available Help at Discharge: Family;Available 24 hours/day Type of Home: House Home Access: Stairs to enter Entrance Stairs-Rails: Psychiatric nurse of Steps: several Home Layout: Two level Home Equipment: None      Prior Function Level of Independence: Independent               Hand Dominance        Extremity/Trunk Assessment   Upper Extremity Assessment: Overall WFL for tasks assessed           Lower Extremity Assessment: Overall WFL for tasks assessed         Communication   Communication: No difficulties  Cognition Arousal/Alertness: Awake/alert Behavior During Therapy: WFL for tasks assessed/performed Overall Cognitive Status: Within Functional Limits for tasks assessed                      General Comments      Exercises        Assessment/Plan    PT Assessment Patient needs continued PT services  PT Diagnosis     PT Problem List    PT Treatment Interventions     PT Goals (Current goals can be found in the Care Plan section) Acute Rehab PT Goals PT Goal Formulation: No goals set, d/c therapy    Frequency     Barriers to discharge  Co-evaluation               End of Session   Activity Tolerance: Patient tolerated treatment well Patient left: Other (comment);with family/visitor present (sitting EOB) Nurse Communication: Mobility status    Functional Assessment Tool Used: clinical judgement Functional Limitation: Mobility: Walking and moving around Mobility: Walking and Moving Around Current Status [phone]): 0 percent impaired, limited or restricted Mobility: Walking and Moving Around Goal Status [phone]): 0 percent impaired, limited or restricted Mobility: Walking and Moving Around Discharge Status [phone]): 0 percent impaired, limited or restricted    Time:  1235-1253 PT Time Calculation (min): 18 min   Charges:   PT Evaluation $Initial PT Evaluation Tier I: 1 Procedure PT Treatments $Gait Training: 8-22 mins   PT G Codes:   Functional Assessment Tool Used: clinical judgement Functional Limitation: Mobility: Walking and moving around    [name], Tessie Fass 12/03/2013, 1:00 PM 12/03/2013  Donnella Sham, [place] [phone]  (pager)

## 2013-12-03 NOTE — Discharge Summary (Signed)
Physician Discharge Summary  [name] JOI:[id number] DOB: [birth year] DOA: 12/02/2013  PCP: Odette Fraction, MD  Admit date: 12/02/2013 Discharge date: 12/03/2013  Time spent: 35 minutes  Recommendations for Outpatient Follow-up:  1. Needs to follow up with Dr Alrderd for possible loop recorder.  2. Need B-met to follow K level and renal function, patient on ACE.  3. Needs further adjustment of BP medications.  4. Needs further refill of Eliquis.   Discharge Diagnoses:  Active Problems:   TIA (transient ischemic attack)   Discharge Condition: Stable.   Diet recommendation: Heart Healthy  Filed Weights   12/02/13 2200  Weight: 76.204 kg (168 lb)    History of present illness:  [name] is a 73 y.o. male, with past medical history significant for atrial flutter status post ablation and hypertrophic cardiomyopathy status post ablation by Dr. Rayann Heman x2 and TIA presenting today with the acute onset of slurring of speech and the deafness that lasted for 45 minutes and resolved upon arrival to the emergency room. The patient has history of TIA in the past which affected years ago. Patient feels is back to normal now . Patient exercised today and that is normal ADLs without any problem until this episode. Neurology saw the patient in the ER, CT of the head was negative.   Hospital Course:  1-TIA;  MRI showed prior stroke. No acute stroke.  Started on eliquis due to prior history of A flutter.  Follow up with Dr Ellender Hose for loop recorder.  Carotid doppler: Preliminary report: 1-39% ICA stenosis. Vertebral artery flow is antegrade.  LDL 54.   2-Cardiomyopathy, Patient underwent routine ECHO which showed worsening EF, now 25 to 30 %.  Cardiology consulted. Repeat ECHO with contrast showed EF 55 %,. He also underwent Myoview: negative for reversible ischemia, Ef 53 % and old scar.   3-HTN; lisinopril was on hold due to mild renal insufficiency and soft BP. Patient BP has  increase, Cr now at 1.1. wil resume lisinopril. Need B-met if renal function worse consider change lisinopril.  4-HLD; continue with vytorin.    Procedures: Myoview: 1. Moderate to large sized areas of prior infarction involving the  inferior wall of the left ventricle extending towards the left  ventricular apex as well as the anterior wall extending towards the  left ventricular septum.  2. No definitive scintigraphic evidence of pharmacologically induced  ischemia.  3. Mild global hypokinesia and mild left ventricular dilatation.  4. Left ventricular ejection fraction - 53%.  ECHO:9-19 - Left ventricle: Wall thickness was increased in a pattern of moderate LVH. Systolic function was severely reduced. The estimated ejection fraction was in the range of 25% to 30%. Diffuse hypokinesis. - Right ventricle: The cavity size was mildly dilated. Systolic function was mildly reduced. - Right atrium: The atrium was moderately dilated.  ECHO with Contrast: Impressions: LImited echo with echo contrast. LVEF is approximately 50 to 55% with septal hypokinesis.   Consultations:  Cardiology  Neurology  Discharge Exam: Filed Vitals:   12/03/13 1348  BP: 112/61  Pulse: 58  Temp: 97.9 F (36.6 C)  Resp: 18    General: no distress.  Cardiovascular: S 1, S 2 R RR Respiratory: CTA  Discharge Instructions You were cared for by a hospitalist during your hospital stay. If you have any questions about your discharge medications or the care you received while you were in the hospital after you are discharged, you can call the unit and asked to speak with the  hospitalist on call if the hospitalist that took care of you is not available. Once you are discharged, your primary care physician will handle any further medical issues. Please note that NO REFILLS for any discharge medications will be authorized once you are discharged, as it is imperative that you return to your primary care physician  (or establish a relationship with a primary care physician if you do not have one) for your aftercare needs so that they can reassess your need for medications and monitor your lab values.  Discharge Instructions   Diet - low sodium heart healthy    Complete by:  As directed      Increase activity slowly    Complete by:  As directed           Current Discharge Medication List    START taking these medications   Details  apixaban (ELIQUIS) 5 MG TABS tablet Take 1 tablet (5 mg total) by mouth 2 (two) times daily. Qty: 60 tablet, Refills: 0      CONTINUE these medications which have NOT CHANGED   Details  doxylamine, Sleep, (UNISOM) 25 MG tablet Take 25 mg by mouth at bedtime as needed.    ezetimibe-simvastatin (VYTORIN) 10-40 MG per tablet Take 1 tablet by mouth at bedtime. Qty: 90 tablet, Refills: 3      STOP taking these medications     lisinopril (PRINIVIL,ZESTRIL) 10 MG tablet      aspirin 81 MG tablet        Allergies  Allergen Reactions  . Penicillins Other (See Comments)    RXN: unknown; "told I might have allergy 1960's"   Follow-up Information   Follow up with [facility] TOM, MD. Call today.   Specialty:  Family Medicine   Contact information:   [address] [place] [address] [phone]       Follow up with Thompson Grayer, MD. Call in 1 week.   Specialty:  Cardiology   Contact information:   [address] [place] [address] [phone]       Follow up with Xu,Jindong, MD. Schedule an appointment as soon as possible for a visit in 1 month.   Specialty:  Neurology   Contact information:   [address] [place] [id number] [phone]        The results of significant diagnostics from this hospitalization (including imaging, microbiology, ancillary and laboratory) are listed below for reference.    Significant Diagnostic Studies: Dg Chest 2 View  12/02/2013   CLINICAL DATA:  CVA.  History  of smoking.  EXAM: CHEST  2 VIEW  COMPARISON:  Chest radiograph from 04/30/2009  FINDINGS: The lungs are well-aerated and clear. There is no evidence of focal opacification, pleural effusion or pneumothorax.  The heart is normal in size; the mediastinal contour is within normal limits. No acute osseous abnormalities are seen.  IMPRESSION: No acute cardiopulmonary process seen.   Electronically Signed   By: Garald Balding M.D.   On: 12/02/2013 22:31   Ct Head Wo Contrast  12/02/2013   CLINICAL DATA:  Code Stroke.  ; severe headache  EXAM: CT HEAD WITHOUT CONTRAST  TECHNIQUE: Contiguous axial images were obtained from the base of the skull through the vertex without intravenous contrast.  COMPARISON:  Noncontrast CT scan of the brain dated February 27, 2010  FINDINGS: The ventricles are normal in size and position. There is no intracranial hemorrhage nor intracranial mass effect. There is no  evidence of acute ischemic change. The cerebellum and brainstem are unremarkable.  There is small amount of fluid within right sphenoid sinus cells. There is minimal mucoperiosteal thickening within ethmoid sinus cells. There is no acute skull fracture nor lytic or blastic calvarial lesion.  IMPRESSION: 1. There is no acute intracranial hemorrhage nor evidence of acute ischemic change. No intracranial mass effect or hydrocephalus is demonstrated. 2. Fluid in the sphenoid sinuses is worrisome for acute sinusitis. There is also mild ethmoid sinus inflammation. 3. These results were called by telephone at the time of interpretation on 12/02/2013 at 5:07 pm to Dr. Wallie Char, who verbally acknowledged these results.   Electronically Signed   By: David  Martinique   On: 12/02/2013 17:07   Mr Jodene Nam Head Wo Contrast  12/02/2013   CLINICAL DATA:  Sudden onset of hearing loss and speech disturbance.  EXAM: MRI HEAD WITHOUT CONTRAST  MRA HEAD WITHOUT CONTRAST  TECHNIQUE: Multiplanar, multiecho pulse sequences of the brain and  surrounding structures were obtained without intravenous contrast. Angiographic images of the head were obtained using MRA technique without contrast.  COMPARISON:  CT ear earlier same day.  MRI 05/01/2009  FINDINGS: MRI HEAD FINDINGS  Diffusion imaging does not show any acute or subacute infarction. The brainstem is normal. There is an old infarction within the inferior cerebellum on the right, though this was not present in February of 2011. The cerebral hemispheres show mild chronic appearing small vessel ischemic change within the white matter. No cortical infarction. No mass lesion, hemorrhage, hydrocephalus or extra-axial collection. No pituitary mass. No significant sinus disease. No skull or skullbase lesion.  MRA HEAD FINDINGS  Both internal carotid arteries are widely patent into the brain. The anterior and middle cerebral vessels are patent without proximal stenosis, aneurysm or vascular malformation. There is atherosclerotic narrowing and irregularity of the more distal branch vessels, particularly in a left temporal MCA branch.  Both vertebral arteries are widely patent to the basilar. No basilar stenosis. Posterior circulation branch vessels are patent. Flow in the right superior cerebellar artery is not as well seen as that on the left. There appears to be distal vessel irregularity particularly in the right PCA branches.  IMPRESSION: No acute infarction.  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 This was not present in 2011. Minimal small vessel change of the cerebral hemispheric white matter.  No major vessel occlusion or correctable proximal stenosis. Some distal vessel atherosclerotic irregularity, particularly notable in the distal right PCA branches and in a left temporal MCA branch.   Electronically Signed   By: Nelson Chimes M.D.   On: 12/02/2013 19:49   Mr Brain Wo Contrast  12/02/2013   CLINICAL DATA:  Sudden onset of hearing loss and speech disturbance.  EXAM: MRI HEAD WITHOUT  CONTRAST  MRA HEAD WITHOUT CONTRAST  TECHNIQUE: Multiplanar, multiecho pulse sequences of the brain and surrounding structures were obtained without intravenous contrast. Angiographic images of the head were obtained using MRA technique without contrast.  COMPARISON:  CT ear earlier same day.  MRI 05/01/2009  FINDINGS: MRI HEAD FINDINGS  Diffusion imaging does not show any acute or subacute infarction. The brainstem is normal. There is an old infarction within the inferior cerebellum on the right, though this was not present in February of 2011. The cerebral hemispheres show mild chronic appearing small vessel ischemic change within the white matter. No cortical infarction. No mass lesion, hemorrhage, hydrocephalus or extra-axial collection. No pituitary mass. No significant sinus disease. No skull  or skullbase lesion.  MRA HEAD FINDINGS  Both internal carotid arteries are widely patent into the brain. The anterior and middle cerebral vessels are patent without proximal stenosis, aneurysm or vascular malformation. There is atherosclerotic narrowing and irregularity of the more distal branch vessels, particularly in a left temporal MCA branch.  Both vertebral arteries are widely patent to the basilar. No basilar stenosis. Posterior circulation branch vessels are patent. Flow in the right superior cerebellar artery is not as well seen as that on the left. There appears to be distal vessel irregularity particularly in the right PCA branches.  IMPRESSION: No acute infarction.  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 This was not present in 2011. Minimal small vessel change of the cerebral hemispheric white matter.  No major vessel occlusion or correctable proximal stenosis. Some distal vessel atherosclerotic irregularity, particularly notable in the distal right PCA branches and in a left temporal MCA branch.   Electronically Signed   By: Nelson Chimes M.D.   On: 12/02/2013 19:49    Microbiology: No results  found for this or any previous visit (from the past 240 hour(s)).   Labs: Basic Metabolic Panel:  Recent Labs Lab 12/02/13 1701 12/02/13 1707 12/03/13 0452 12/03/13 1510  NA 139 138 143  --   K 5.0 4.7 5.4* 4.6  CL 104 107 106  --   CO2 20  --  22  --   GLUCOSE 91 90 79  --   BUN 31* 33* 27*  --   CREATININE 1.41* 1.50* 1.15  --   CALCIUM 9.2  --  8.7  --    Liver Function Tests:  Recent Labs Lab 12/02/13 1701  AST 37  ALT 22  ALKPHOS 30*  BILITOT 0.3  PROT 6.6  ALBUMIN 4.0   No results found for this basename: LIPASE, AMYLASE,  in the last 168 hours No results found for this basename: AMMONIA,  in the last 168 hours CBC:  Recent Labs Lab 12/02/13 1701 12/02/13 1707  WBC 10.6*  --   NEUTROABS 6.2  --   HGB 13.9 14.6  HCT 41.5 43.0  MCV 90.6  --   PLT 193  --    Cardiac Enzymes: No results found for this basename: CKTOTAL, CKMB, CKMBINDEX, TROPONINI,  in the last 168 hours BNP: BNP (last 3 results) No results found for this basename: PROBNP,  in the last 8760 hours CBG: No results found for this basename: GLUCAP,  in the last 168 hours     Signed:  Niel Hummer A  [facility] 12/03/2013, 5:24 PM

## 2013-12-03 NOTE — Progress Notes (Signed)
ANTICOAGULATION CONSULT NOTE - Initial Consult  Pharmacy Consult for apixaban Indication: atrial fibrillation and stroke  Allergies  Allergen Reactions  . Penicillins Other (See Comments)    RXN: unknown; "told I might have allergy 1960's"    Patient Measurements: Height: 5\' 10"  (177.8 cm) Weight: 168 lb (76.204 kg) IBW/kg (Calculated) : 73 Heparin Dosing Weight:   Vital Signs: Temp: 97.9 F (36.6 C) (09/19 1348) Temp src: Oral (09/19 1348) BP: 112/61 mmHg (09/19 1348) Pulse Rate: 58 (09/19 1348)  Labs:  Recent Labs  12/02/13 1701 12/02/13 1707 12/03/13 0452  HGB 13.9 14.6  --   HCT 41.5 43.0  --   PLT 193  --   --   APTT 27  --   --   LABPROT 14.1  --   --   INR 1.09  --   --   CREATININE 1.41* 1.50* 1.15    Estimated Creatinine Clearance: 59.1 ml/min (by C-G formula based on Cr of 1.15).   Medical History: Past Medical History  Diagnosis Date  . Hypertrophic cardiomyopathy   . Diverticulosis   . Hemorrhoids   . BPH (benign prostatic hyperplasia)   . TIA (transient ischemic attack) ~ 2011    denies residual (12/11/2011)  . Hypertension   . Hyperlipidemia   . Basal cell carcinoma of cheek ~ 1973    left  . Atrial flutter     s/p CTI ablation by Dr Rayann Heman 10/12, 9/13    Medications:  Scheduled:  . aspirin  300 mg Rectal QHS   Or  . aspirin  325 mg Oral QHS  . enoxaparin (LOVENOX) injection  30 mg Subcutaneous Q24H  . ezetimibe-simvastatin  1 tablet Oral QHS   Infusions:  . sodium chloride 100 mL/hr (12/03/13 1148)    Assessment: 73 yo who was admitted for probable TIA. He has a hx of afib/aflutter that was ablated. Changing to apixaban today. Pt is currently on lovenox prophylaxis so will dc this.   Goal of Therapy:   Monitor platelets by anticoagulation protocol: Yes   Plan:   Dc lovenox Apixaban 5mg  PO BID Monitor for bleeding  Onnie Boer [facility] 12/03/2013,3:36 PM

## 2013-12-03 NOTE — Consult Note (Signed)
Admit date: 12/02/2013 Referring Physician  Dr. Frederic Jericho Primary Physician  Dr. Cindi Carbon Primary Cardiologist  Dr. Rayann Heman Reason for Consultation  Cardiomyopathy  HPI: [name] is a 73 y.o. male, with past medical history significant for atrial flutter status post ablation and hypertrophic cardiomyopathy followed by Dr. Rayann Heman x2 and TIA presenting today with the acute onset of slurring of speech and the deafness that lasted for 45 minutes and resolved upon arrival to the emergency room. The patient has history of TIA in the past which affected years ago. Patient feels is back to normal now . Patient exercised today and that is normal ADLs without any problem until this episode. He has a history of mild LV dysfunction with EF 45% by echo 05/2013 but today 2D echo was done which showed moderate LVF and severely reduced LVF EF 25-30% with mildy dilated RV with mildly reduced RVF and dilated RA.  Cardiology is now asked to consult.  He denies any chest pain, SOB, DOE, LE edema.       PMH:   Past Medical History  Diagnosis Date  . Hypertrophic cardiomyopathy   . Diverticulosis   . Hemorrhoids   . BPH (benign prostatic hyperplasia)   . TIA (transient ischemic attack) ~ 2011    denies residual (12/11/2011)  . Hypertension   . Hyperlipidemia   . Basal cell carcinoma of cheek ~ 1973    left  . Atrial flutter     s/p CTI ablation by Dr Rayann Heman 10/12, 9/13     PSH:   Past Surgical History  Procedure Laterality Date  . Basal cell carcinoma excision  ~ 1973    left cheek  . Total hip arthroplasty      left  . Atrial ablation surgery  10/12, 9/13 (repeat)    CTI ablation for atrial flutter by Dr Rayann Heman  . Cardiac catheterization  1960's  . A flutter ablation  12/11/2011  . Tonsillectomy and adenoidectomy  1947  . Joint replacement    . Hernia repair  ~ 4742    umbilical    Allergies:  Penicillins Prior to Admit Meds:   Prescriptions prior to admission  Medication Sig Dispense  Refill  . doxylamine, Sleep, (UNISOM) 25 MG tablet Take 25 mg by mouth at bedtime as needed.      . ezetimibe-simvastatin (VYTORIN) 10-40 MG per tablet Take 1 tablet by mouth at bedtime.  90 tablet  3  . lisinopril (PRINIVIL,ZESTRIL) 10 MG tablet Take 0.5 tablets (5 mg total) by mouth daily.  45 tablet  3  . [DISCONTINUED] aspirin 81 MG tablet Take 81 mg by mouth daily.       Fam HX:    Family History  Problem Relation Age of Onset  . Colon polyps Brother   . Tuberculosis Brother     and father  . Breast cancer Mother    Social HX:    History   Social History  . Marital Status: Married    Spouse Name: N/A    Number of Children: 2  . Years of Education: N/A   Occupational History  . retired    Social History Main Topics  . Smoking status: Former Smoker -- 2.00 packs/day for 10 years    Types: Cigarettes    Quit date: 03/17/1970  . Smokeless tobacco: Never Used  . Alcohol Use: Yes     Comment: quit 1995  . Drug Use: No  . Sexual Activity: Yes   Other Topics Concern  .  Not on file   Social History Narrative   Lives in [place] with spouse.  2 Grown children.  Retired from Data processing manager     ROS:  All 11 ROS were addressed and are negative except what is stated in the HPI  Physical Exam: Blood pressure 122/69, pulse 66, temperature 97.9 F (36.6 C), temperature source Oral, resp. rate 16, height 5\' 10"  (1.778 m), weight 168 lb (76.204 kg), SpO2 100.00%.    General: Well developed, well nourished, in no acute distress Head: Eyes PERRLA, No xanthomas.   Normal cephalic and atramatic  Lungs:   Clear bilaterally to auscultation and percussion. Heart:   HRRR S1 S2 Pulses are 2+ & equal.            No carotid bruit. No JVD.  No abdominal bruits. No femoral bruits. Abdomen: Bowel sounds are positive, abdomen soft and non-tender without masses  Extremities:   No clubbing, cyanosis or edema.  DP +1 Neuro: Alert and oriented X 3. Psych:  Good affect, responds  appropriately    Labs:   Lab Results  Component Value Date   WBC 10.6* 12/02/2013   HGB 14.6 12/02/2013   HCT 43.0 12/02/2013   MCV 90.6 12/02/2013   PLT 193 12/02/2013    Recent Labs Lab 12/02/13 1701  12/03/13 0452 12/03/13 1510  NA 139  < > 143  --   K 5.0  < > 5.4* 4.6  CL 104  < > 106  --   CO2 20  --  22  --   BUN 31*  < > 27*  --   CREATININE 1.41*  < > 1.15  --   CALCIUM 9.2  --  8.7  --   PROT 6.6  --   --   --   BILITOT 0.3  --   --   --   ALKPHOS 30*  --   --   --   ALT 22  --   --   --   AST 37  --   --   --   GLUCOSE 91  < > 79  --   < > = values in this interval not displayed. No results found for this basename: PTT   Lab Results  Component Value Date   INR 1.09 12/02/2013   INR 1.2* 12/08/2011   INR 1.3* 12/24/2010   Lab Results  Component Value Date   CKTOTAL 118 05/01/2009   CKMB 5.1* 05/01/2009   TROPONINI  Value: 0.04        NO INDICATION OF MYOCARDIAL INJURY. 05/01/2009     Lab Results  Component Value Date   CHOL 128 12/03/2013   CHOL 131 09/08/2013   CHOL 134 04/25/2013   Lab Results  Component Value Date   HDL 61 12/03/2013   HDL 62 09/08/2013   HDL 59.00 04/25/2013   Lab Results  Component Value Date   LDLCALC 54 12/03/2013   LDLCALC 54 09/08/2013   LDLCALC 63 04/25/2013   Lab Results  Component Value Date   TRIG 66 12/03/2013   TRIG 73 09/08/2013   TRIG 58.0 04/25/2013   Lab Results  Component Value Date   CHOLHDL 2.1 12/03/2013   CHOLHDL 2.1 09/08/2013   CHOLHDL 2 04/25/2013   No results found for this basename: LDLDIRECT      Radiology:  Dg Chest 2 View  12/02/2013   CLINICAL DATA:  CVA.  History of smoking.  EXAM: CHEST  2  VIEW  COMPARISON:  Chest radiograph from 04/30/2009  FINDINGS: The lungs are well-aerated and clear. There is no evidence of focal opacification, pleural effusion or pneumothorax.  The heart is normal in size; the mediastinal contour is within normal limits. No acute osseous abnormalities are seen.  IMPRESSION: No acute  cardiopulmonary process seen.   Electronically Signed   By: Garald Balding M.D.   On: 12/02/2013 22:31   Ct Head Wo Contrast  12/02/2013   CLINICAL DATA:  Code Stroke.  ; severe headache  EXAM: CT HEAD WITHOUT CONTRAST  TECHNIQUE: Contiguous axial images were obtained from the base of the skull through the vertex without intravenous contrast.  COMPARISON:  Noncontrast CT scan of the brain dated February 27, 2010  FINDINGS: The ventricles are normal in size and position. There is no intracranial hemorrhage nor intracranial mass effect. There is no evidence of acute ischemic change. The cerebellum and brainstem are unremarkable.  There is small amount of fluid within right sphenoid sinus cells. There is minimal mucoperiosteal thickening within ethmoid sinus cells. There is no acute skull fracture nor lytic or blastic calvarial lesion.  IMPRESSION: 1. There is no acute intracranial hemorrhage nor evidence of acute ischemic change. No intracranial mass effect or hydrocephalus is demonstrated. 2. Fluid in the sphenoid sinuses is worrisome for acute sinusitis. There is also mild ethmoid sinus inflammation. 3. These results were called by telephone at the time of interpretation on 12/02/2013 at 5:07 pm to Dr. Wallie Char, who verbally acknowledged these results.   Electronically Signed   By: David  Martinique   On: 12/02/2013 17:07   Mr Jodene Nam Head Wo Contrast  12/02/2013   CLINICAL DATA:  Sudden onset of hearing loss and speech disturbance.  EXAM: MRI HEAD WITHOUT CONTRAST  MRA HEAD WITHOUT CONTRAST  TECHNIQUE: Multiplanar, multiecho pulse sequences of the brain and surrounding structures were obtained without intravenous contrast. Angiographic images of the head were obtained using MRA technique without contrast.  COMPARISON:  CT ear earlier same day.  MRI 05/01/2009  FINDINGS: MRI HEAD FINDINGS  Diffusion imaging does not show any acute or subacute infarction. The brainstem is normal. There is an old infarction within  the inferior cerebellum on the right, though this was not present in February of 2011. The cerebral hemispheres show mild chronic appearing small vessel ischemic change within the white matter. No cortical infarction. No mass lesion, hemorrhage, hydrocephalus or extra-axial collection. No pituitary mass. No significant sinus disease. No skull or skullbase lesion.  MRA HEAD FINDINGS  Both internal carotid arteries are widely patent into the brain. The anterior and middle cerebral vessels are patent without proximal stenosis, aneurysm or vascular malformation. There is atherosclerotic narrowing and irregularity of the more distal branch vessels, particularly in a left temporal MCA branch.  Both vertebral arteries are widely patent to the basilar. No basilar stenosis. Posterior circulation branch vessels are patent. Flow in the right superior cerebellar artery is not as well seen as that on the left. There appears to be distal vessel irregularity particularly in the right PCA branches.  IMPRESSION: No acute infarction.  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 This was not present in 2011. Minimal small vessel change of the cerebral hemispheric white matter.  No major vessel occlusion or correctable proximal stenosis. Some distal vessel atherosclerotic irregularity, particularly notable in the distal right PCA branches and in a left temporal MCA branch.   Electronically Signed   By: Nelson Chimes M.D.   On: 12/02/2013  19:49   Mr Brain Wo Contrast  12/02/2013   CLINICAL DATA:  Sudden onset of hearing loss and speech disturbance.  EXAM: MRI HEAD WITHOUT CONTRAST  MRA HEAD WITHOUT CONTRAST  TECHNIQUE: Multiplanar, multiecho pulse sequences of the brain and surrounding structures were obtained without intravenous contrast. Angiographic images of the head were obtained using MRA technique without contrast.  COMPARISON:  CT ear earlier same day.  MRI 05/01/2009  FINDINGS: MRI HEAD FINDINGS  Diffusion imaging does  not show any acute or subacute infarction. The brainstem is normal. There is an old infarction within the inferior cerebellum on the right, though this was not present in February of 2011. The cerebral hemispheres show mild chronic appearing small vessel ischemic change within the white matter. No cortical infarction. No mass lesion, hemorrhage, hydrocephalus or extra-axial collection. No pituitary mass. No significant sinus disease. No skull or skullbase lesion.  MRA HEAD FINDINGS  Both internal carotid arteries are widely patent into the brain. The anterior and middle cerebral vessels are patent without proximal stenosis, aneurysm or vascular malformation. There is atherosclerotic narrowing and irregularity of the more distal branch vessels, particularly in a left temporal MCA branch.  Both vertebral arteries are widely patent to the basilar. No basilar stenosis. Posterior circulation branch vessels are patent. Flow in the right superior cerebellar artery is not as well seen as that on the left. There appears to be distal vessel irregularity particularly in the right PCA branches.  IMPRESSION: No acute infarction.  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 This was not present in 2011. Minimal small vessel change of the cerebral hemispheric white matter.  No major vessel occlusion or correctable proximal stenosis. Some distal vessel atherosclerotic irregularity, particularly notable in the distal right PCA branches and in a left temporal MCA branch.   Electronically Signed   By: Nelson Chimes M.D.   On: 12/02/2013 19:49    EKG:  Probably NSR with PAC's first degree AV block and RBBB  ASSESSMENT:  1.  New severe LV dysfunction on echo - EF earlier this year was 45% and now read as 25-30% of ? Etiology.  I personally reviewed the echo and the endocardium is not well visualized in all views but there appears to be apical akinesis and the septum is hypokinetic but he is paced.  The EF appears to be at  least 35-40% and in some views appears 40-45%.  It it hard to believe that he would have significant LV dysfunction or CAD with ischemia and be able to bike 60 miles weekly without any problems. Given his new regional wall motion abnormalities will need an ischemic workup. 2.  Atrial flutter s/p ablation x2 3.  Hypertrophic DCM 4.  Acute recurrent TIA - he denies any palpitations but has not felt his atrial flutter in the past except when noting it on a personal HR monitor that he wears.  PLAN:   1.  Cannot add BB due to bradycardia 2.  Add low dose ACE I as BP will tolerate - will let Hospitalist determine from TIA standpoint if ok to start given borderline BP and unclear etiology of CVA 3.  Given worsening LVF and new RWMA's (he is ventricular paced which may be causing the appearance of a septal wall motion abnormality) -need to rule out ischemic LV dysfunction.  Will make NPO after MN for stress myoview in the am 4.  Recommend loop recorder to rule out recurrent atrial flutter as etiology of new TIA -  he had not been on anticoagulation prior to admission 5.  Continue Apixiban 6.  Recommend repeat limited echo with definity contrast to evaluate apex for LV thrombus and assess RWMA's better  Sueanne Margarita, MD  12/03/2013  7:48 PM

## 2013-12-04 ENCOUNTER — Observation Stay (HOSPITAL_COMMUNITY): Payer: Medicare Other

## 2013-12-04 DIAGNOSIS — I519 Heart disease, unspecified: Secondary | ICD-10-CM

## 2013-12-04 DIAGNOSIS — I10 Essential (primary) hypertension: Secondary | ICD-10-CM

## 2013-12-04 DIAGNOSIS — G458 Other transient cerebral ischemic attacks and related syndromes: Secondary | ICD-10-CM

## 2013-12-04 DIAGNOSIS — G459 Transient cerebral ischemic attack, unspecified: Principal | ICD-10-CM

## 2013-12-04 DIAGNOSIS — I422 Other hypertrophic cardiomyopathy: Secondary | ICD-10-CM

## 2013-12-04 DIAGNOSIS — I4892 Unspecified atrial flutter: Secondary | ICD-10-CM

## 2013-12-04 MED ORDER — TECHNETIUM TC 99M SESTAMIBI GENERIC - CARDIOLITE
10.0000 | Freq: Once | INTRAVENOUS | Status: AC | PRN
  Administered 2013-12-04: 10 via INTRAVENOUS

## 2013-12-04 MED ORDER — REGADENOSON 0.4 MG/5ML IV SOLN
INTRAVENOUS | Status: AC
  Administered 2013-12-04: 0.4 mg via INTRAVENOUS
  Filled 2013-12-04: qty 5

## 2013-12-04 MED ORDER — TECHNETIUM TC 99M SESTAMIBI - CARDIOLITE
30.0000 | Freq: Once | INTRAVENOUS | Status: AC | PRN
  Administered 2013-12-04: 10:00:00 30 via INTRAVENOUS

## 2013-12-04 MED ORDER — REGADENOSON 0.4 MG/5ML IV SOLN
0.4000 mg | Freq: Once | INTRAVENOUS | Status: AC
  Administered 2013-12-04: 0.4 mg via INTRAVENOUS

## 2013-12-04 MED ORDER — LISINOPRIL 5 MG PO TABS: 5.0000 mg | ORAL_TABLET | Freq: Every day | ORAL | Status: DC

## 2013-12-04 MED ORDER — LISINOPRIL 5 MG PO TABS
5.0000 mg | ORAL_TABLET | Freq: Every day | ORAL | Status: DC
  Administered 2013-12-04: 5 mg via ORAL
  Filled 2013-12-04: qty 1

## 2013-12-04 MED ORDER — PERFLUTREN LIPID MICROSPHERE
INTRAVENOUS | Status: AC
  Administered 2013-12-04: 12:00:00
  Filled 2013-12-04: qty 10

## 2013-12-04 MED ORDER — ACETAMINOPHEN 325 MG PO TABS
650.0000 mg | ORAL_TABLET | Freq: Four times a day (QID) | ORAL | Status: DC | PRN
  Administered 2013-12-04: 650 mg via ORAL

## 2013-12-04 NOTE — Progress Notes (Signed)
Patient Name: [name] Date of Encounter: 12/04/2013  Principal Problem:   TIA (transient ischemic attack) Active Problems:   Cardiomyopathy   Hypertension, essential   Hypertrophic nonobstructive cardiomyopathy   Atrial flutter   SUBJECTIVE  No chest pain or sob.  For myoview today.  CURRENT MEDS . apixaban  5 mg Oral BID  . ezetimibe-simvastatin  1 tablet Oral QHS   OBJECTIVE  Filed Vitals:   12/03/13 2117 12/04/13 0212 12/04/13 0654 12/04/13 0910  BP: 137/86 125/72 128/79 147/86  Pulse: 56 60 51   Temp: 97.6 F (36.4 C) 97.3 F (36.3 C) 97.5 F (36.4 C)   TempSrc: Oral Oral Oral   Resp: 18 18 18    Height:      Weight:      SpO2: 98% 98% 100%    No intake or output data in the 24 hours ending 12/04/13 0933 Filed Weights   12/02/13 2200  Weight: 168 lb (76.204 kg)    PHYSICAL EXAM  General: Pleasant, NAD. Neuro: Alert and oriented X 3. Moves all extremities spontaneously. Psych: Normal affect. HEENT:  Normal  Neck: Supple without bruits or JVD. Lungs:  Resp regular and unlabored, CTA. Heart: RRR no s3, s4, or murmurs. Abdomen: Soft, non-tender, non-distended, BS + x 4.  Extremities: No clubbing, cyanosis or edema. DP/PT/Radials 2+ and equal bilaterally.  Accessory Clinical Findings  CBC  Recent Labs  12/02/13 1701 12/02/13 1707  WBC 10.6*  --   NEUTROABS 6.2  --   HGB 13.9 14.6  HCT 41.5 43.0  MCV 90.6  --   PLT 193  --    Basic Metabolic Panel  Recent Labs  12/02/13 1701 12/02/13 1707 12/03/13 0452 12/03/13 1510  NA 139 138 143  --   K 5.0 4.7 5.4* 4.6  CL 104 107 106  --   CO2 20  --  22  --   GLUCOSE 91 90 79  --   BUN 31* 33* 27*  --   CREATININE 1.41* 1.50* 1.15  --   CALCIUM 9.2  --  8.7  --    Liver Function Tests  Recent Labs  12/02/13 1701  AST 37  ALT 22  ALKPHOS 30*  BILITOT 0.3  PROT 6.6  ALBUMIN 4.0   Hemoglobin A1C  Recent Labs  12/03/13 0452  HGBA1C 6.1*   Fasting Lipid Panel  Recent  Labs  12/03/13 0452  CHOL 128  HDL 61  LDLCALC 54  TRIG 66  CHOLHDL 2.1   TELE  Seen in nuc med - sinus w/ 1st deg avb.  Radiology/Studies  Dg Chest 2 View  12/02/2013   CLINICAL DATA:  CVA.  History of smoking.  EXAM: CHEST  2 VIEW  COMPARISON:  Chest radiograph from 04/30/2009  FINDINGS: The lungs are well-aerated and clear. There is no evidence of focal opacification, pleural effusion or pneumothorax.  The heart is normal in size; the mediastinal contour is within normal limits. No acute osseous abnormalities are seen.  IMPRESSION: No acute cardiopulmonary process seen.   Electronically Signed   By: Garald Balding M.D.   On: 12/02/2013 22:31   Ct Head Wo Contrast  12/02/2013   CLINICAL DATA:  Code Stroke.  ; severe headache  EXAM: CT HEAD WITHOUT CONTRAST    IMPRESSION: 1. There is no acute intracranial hemorrhage nor evidence of acute ischemic change. No intracranial mass effect or hydrocephalus is demonstrated. 2. Fluid in the sphenoid sinuses is worrisome for acute sinusitis.  There is also mild ethmoid sinus inflammation. 3. These results were called by telephone at the time of interpretation on 12/02/2013 at 5:07 pm to Dr. Wallie Char, who verbally acknowledged these results.   Electronically Signed   By: David  Martinique   On: 12/02/2013 17:07   Mr Brain Wo Contrast  12/02/2013   CLINICAL DATA:  Sudden onset of hearing loss and speech disturbance.  EXAM: MRI HEAD WITHOUT CONTRAST  MRA HEAD WITHOUT CONTRAST  IMPRESSION: No acute infarction.  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 This was not present in 2011. Minimal small vessel change of the cerebral hemispheric white matter.  No major vessel occlusion or correctable proximal stenosis. Some distal vessel atherosclerotic irregularity, particularly notable in the distal right PCA branches and in a left temporal MCA branch.   Electronically Signed   By: Nelson Chimes M.D.   On: 12/02/2013 19:49   2D Echocardiogram  9.19.2015  Study Conclusions  - Left ventricle: Wall thickness was increased in a pattern of   moderate LVH. Systolic function was severely reduced. The   estimated ejection fraction was in the range of 25% to 30%.   Diffuse hypokinesis. - Right ventricle: The cavity size was mildly dilated. Systolic   function was mildly reduced. - Right atrium: The atrium was moderately dilated. _____________   ASSESSMENT AND PLAN  1.  TIA:  Per IM.  Rec event monitor @ d/c to r/o recurrent asymptomatic atrial arrhythmias.  On eliquis.  2.  Cardiomyopathy:  EF 25-30% w/ diff HK on echo yesterday.  Euvolemic on exam.  No chest pain.  Nl trop on 9/18.  For MV today.  No BB in setting of bradycardia.  ACEI once OK from IM/TIA standpoint.  Myoview today to assess for ischemic cause of CM.  3.  H/O Atrial Flutter:  S/p RFCA x 2.  4.  HTN:  Stable.  Signed, Murray Hodgkins NP  I have seen and examined the patient along with Murray Hodgkins NP.  I have reviewed the chart, notes and new data.  I agree with PA's note.  I reviewed his nuclear images. The large area of "scar" is probably LBBB-related artifact. No ischemia is seen. EF is calculated at 53%. I suspect the technically difficult echo images and the LBBB related dyssynchrony are the reasons why his LVEF was underestimated on the recent echo. Clinically, he has no evidence for significant worsening of his cardiomyopathy. He rode 60 miles on his bike this week.   PLAN: DC home on Eliquis for suspected recurrent Aflutter or new Afib. Outpatient loop recorder. If Aflutter is the problem, he may benefit from another ablation, hopefully leading to long term success and eventually DCing anticoagulants. If he has Afib, keep on lifelong anticoagulants. Ablation not indicated for asymptomatic AFib. Will arrange f/U with Dr. Rayann Heman.  Sanda Klein, MD, [place] [phone] 12/04/2013, 12:12 PM

## 2013-12-04 NOTE — Progress Notes (Signed)
Patient ready for discharge; instructions given and reviewed with Rxs.;patient discharged to home accompanied by his wife.

## 2013-12-04 NOTE — Progress Notes (Signed)
OT Cancellation Note  Patient Details Name: [name] MRN: [id number] DOB: 13-Sep-1940   Cancelled Treatment:    Reason Eval/Treat Not Completed: OT screened, no needs identified, will sign off. Pt back to baseline and all symptoms have resolved. Discussed briefly ways to increase safety at home and signs and symptoms of stroke.   Juluis Rainier [phone] 12/04/2013, 12:13 PM

## 2013-12-04 NOTE — Progress Notes (Signed)
  Echocardiogram 2D Echocardiogram limited with Definity has been performed.  Quaid Yeakle FRANCES 12/04/2013, 12:21 PM

## 2013-12-04 NOTE — Progress Notes (Signed)
OT Cancellation Note  Patient Details Name: [name] MRN: [id number] DOB: 1941-03-08   Cancelled Treatment:    Reason Eval/Treat Not Completed: Patient at procedure or test/ unavailable. Per PT, pt at baseline, however would like to check with pt to ensure no cognitive, visual, or sensation changes. Will follow up with pt as available this afternoon.   Juluis Rainier [phone] 12/04/2013, 10:15 AM

## 2013-12-04 NOTE — Progress Notes (Signed)
Utilization Review Completed.   Dmetrius Ambs, RN, BSN Nurse Case Manager  

## 2013-12-07 ENCOUNTER — Encounter: Payer: Self-pay | Admitting: Internal Medicine

## 2013-12-07 ENCOUNTER — Ambulatory Visit (INDEPENDENT_AMBULATORY_CARE_PROVIDER_SITE_OTHER): Payer: Medicare Other | Admitting: Internal Medicine

## 2013-12-07 VITALS — BP 130/78 | HR 56 | Ht 70.0 in | Wt 174.8 lb

## 2013-12-07 DIAGNOSIS — I4892 Unspecified atrial flutter: Secondary | ICD-10-CM

## 2013-12-07 DIAGNOSIS — I4891 Unspecified atrial fibrillation: Secondary | ICD-10-CM

## 2013-12-07 DIAGNOSIS — G459 Transient cerebral ischemic attack, unspecified: Secondary | ICD-10-CM

## 2013-12-07 DIAGNOSIS — I422 Other hypertrophic cardiomyopathy: Secondary | ICD-10-CM

## 2013-12-07 DIAGNOSIS — I483 Typical atrial flutter: Secondary | ICD-10-CM

## 2013-12-07 MED ORDER — APIXABAN 5 MG PO TABS: 5.0000 mg | ORAL_TABLET | Freq: Two times a day (BID) | ORAL | Status: DC

## 2013-12-07 NOTE — Patient Instructions (Signed)
Your physician recommends that you schedule a follow-up appointment in 3 months with Dr Allred    

## 2013-12-07 NOTE — Progress Notes (Signed)
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 S/p  Ablation x 2  for recurrent atrial flutter 10/12 and 9/13.,He has had no further atrial flutter that he is aware of. Friday pm he was sitting at home after earlier going out for a bike ride. He suddenly loss speech and hearing and presented to [facility] within 30 mins. After another 15 mins, his symptoms cleared. MRI showed prior stroke. Echo revealed EF of 55%, stress test negative for ischemia CT negative.Carotids non significant disease. He was place on Apixaban for h/o prior stroke and dc home. He was in SR during hospitalization and denies any arrhthymias. Wears a heart monitor during exercise and denies seeing any irregular heart beat.    Today, he denies symptoms of palpitations, chest pain, shortness of breath,  lower extremity edema, dizziness, presyncope, or syncope.  The patient is otherwise without complaint today.   Past Medical History  Diagnosis Date  . Hypertrophic cardiomyopathy   . Diverticulosis   . Hemorrhoids   . BPH (benign prostatic hyperplasia)   . TIA (transient ischemic attack) ~ 2011    denies residual (12/11/2011)  . Hypertension   . Hyperlipidemia   . Basal cell carcinoma of cheek ~ 1973    left  . Atrial flutter     s/p CTI ablation by Dr Rayann Heman 10/12, 9/13   Past Surgical History  Procedure Laterality Date  . Basal cell carcinoma excision  ~ 1973    left cheek  . Total hip arthroplasty      left  . Atrial ablation surgery  10/12, 9/13 (repeat)    CTI ablation for atrial flutter by Dr Rayann Heman  . Cardiac catheterization  1960's  . A flutter ablation  12/11/2011  . Tonsillectomy and adenoidectomy  1947  . Joint replacement    . Hernia repair  ~ 4696    umbilical    Current Outpatient Prescriptions  Medication Sig Dispense Refill  . apixaban (ELIQUIS) 5 MG TABS tablet Take 1 tablet (5  mg total) by mouth 2 (two) times daily.  180 tablet  3  . doxylamine, Sleep, (UNISOM) 25 MG tablet Take 25 mg by mouth at bedtime as needed for sleep.       Marland Kitchen ezetimibe-simvastatin (VYTORIN) 10-40 MG per tablet Take 1 tablet by mouth at bedtime.  90 tablet  3  . lisinopril (PRINIVIL,ZESTRIL) 5 MG tablet Take 1 tablet (5 mg total) by mouth daily.  30 tablet  0   No current facility-administered medications for this visit.    Physical Exam: Filed Vitals:   12/07/13 1105  BP: 130/78  Pulse: 56  Height: 5\' 10"  (1.778 m)  Weight: 174 lb 12.8 oz (79.289 kg)    GEN- The patient is well appearing, alert and oriented x 3 today.   Head- normocephalic, atraumatic Eyes-  Sclera clear, conjunctiva pink Ears- hearing intact Oropharynx- clear Lungs- Clear to ausculation bilaterally, normal work of breathing Heart- Regular rate and rhythm, no murmurs, rubs or gallops, PMI not laterally displaced GI- soft, NT, ND, + BS Extremities- no clubbing, cyanosis, or edema  Ekg today reveals sinus brady at 56 bpm with first degree heart block, RBBB,LAFB. Epic records reviewed. See HPI for tests done in hospital.  Assessment and Plan:  1. TIA Pt was referred for ILR, but it is not indicated at this point due to lack of  arrythmia and  already being anticoagulated. Continue apixaban with previous stroke by MRI and recent TIA.  2. H/O aflutter ablation No apparent arrythmia. No change in treatment indicated.  3. Recheck in 3 months, sooner if arrythmia.  I have seen, examined the patient, and reviewed the above assessment and plan with Roderic Palau NP.  Changes to above are made where necessary.  Given prior h/o atrial arrhythmias and subsequent stroke, he has been initiated on eliquis.  This is probably reasonable.  As he has already been committed to anticoagulant therapy, I am not sure that implantation of a loop recorder would offer additional clinical decision making information.  We will therefore  follow for symptoms of arrhythmia and then investigate should they occur.  He has septal hypertrophic CM but has otherwise done well.  Recent Echos are reviewed today as well as myoview.  Co Sign: Thompson Grayer, MD 12/07/2013 11:06 PM

## 2013-12-14 ENCOUNTER — Encounter: Payer: Medicare Other | Admitting: Internal Medicine

## 2013-12-20 ENCOUNTER — Other Ambulatory Visit: Payer: Self-pay | Admitting: *Deleted

## 2013-12-20 MED ORDER — APIXABAN 5 MG PO TABS: 5.0000 mg | ORAL_TABLET | Freq: Two times a day (BID) | ORAL | Status: DC

## 2014-01-02 ENCOUNTER — Telehealth: Payer: Self-pay | Admitting: Nurse Practitioner

## 2014-01-02 ENCOUNTER — Other Ambulatory Visit: Payer: Self-pay | Admitting: *Deleted

## 2014-01-02 MED ORDER — APIXABAN 5 MG PO TABS: 5.0000 mg | ORAL_TABLET | Freq: Two times a day (BID) | ORAL | Status: DC

## 2014-01-02 NOTE — Telephone Encounter (Signed)
Leaving eliquis samples up front.  Pt's wife aware.

## 2014-01-02 NOTE — Telephone Encounter (Signed)
error 

## 2014-01-02 NOTE — Telephone Encounter (Signed)
New Message     Pt's wife calling asking if [name] has an Eliquis samples in stock. States she was told to call back and see if we had any on hand. Please call pt back and advise.

## 2014-01-03 ENCOUNTER — Ambulatory Visit (INDEPENDENT_AMBULATORY_CARE_PROVIDER_SITE_OTHER): Payer: Medicare Other | Admitting: Family Medicine

## 2014-01-03 DIAGNOSIS — Z23 Encounter for immunization: Secondary | ICD-10-CM

## 2014-01-06 ENCOUNTER — Telehealth: Payer: Self-pay | Admitting: *Deleted

## 2014-01-06 NOTE — Telephone Encounter (Signed)
Patient requests a cheaper alternative to the vytorin. Please advise. Thanks, MI

## 2014-01-09 NOTE — Telephone Encounter (Signed)
I would forward this to Dr. Rayann Heman.

## 2014-01-20 NOTE — Telephone Encounter (Signed)
Will ask our lipid clinic to advise and manage.

## 2014-01-24 NOTE — Telephone Encounter (Signed)
LDL has been in the 50s.  Would suggest changing to just simvastatin 40mg  daily and rechecking labs in 2 months.  Have left message for pt to follow up where he would like the prescription sent.

## 2014-01-31 MED ORDER — SIMVASTATIN 40 MG PO TABS: 40.0000 mg | ORAL_TABLET | Freq: Every day | ORAL | Status: DC

## 2014-01-31 NOTE — Telephone Encounter (Signed)
Spoke with pt.  He has about 3 months of Vytorin left that he will finish then change to simvastatin.  He will call us when he makes the switch to set up labwork.

## 2014-02-23 ENCOUNTER — Encounter (HOSPITAL_COMMUNITY): Payer: Self-pay | Admitting: Internal Medicine

## 2014-03-20 ENCOUNTER — Ambulatory Visit (INDEPENDENT_AMBULATORY_CARE_PROVIDER_SITE_OTHER): Payer: PPO | Admitting: Internal Medicine

## 2014-03-20 ENCOUNTER — Encounter: Payer: Self-pay | Admitting: Internal Medicine

## 2014-03-20 VITALS — BP 140/70 | HR 58 | Ht 70.0 in | Wt 181.0 lb

## 2014-03-20 DIAGNOSIS — I4892 Unspecified atrial flutter: Secondary | ICD-10-CM

## 2014-03-20 DIAGNOSIS — E785 Hyperlipidemia, unspecified: Secondary | ICD-10-CM

## 2014-03-20 NOTE — Progress Notes (Signed)
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 He has had no further atrial flutter that he is aware of, nor TIA symptoms.He was placed on Apixaban and is tolerating well. He was in SR during hospitalization and denies any arrhthymias. Wears a heart monitor during exercise and denies seeing any irregular heart beat. Rides his bike 3x week weather permitting.    Today, he denies symptoms of palpitations, chest pain, shortness of breath,  lower extremity edema, dizziness, presyncope, or syncope.  The patient is otherwise without complaint today.   Past Medical History  Diagnosis Date  . Hypertrophic cardiomyopathy   . Diverticulosis   . Hemorrhoids   . BPH (benign prostatic hyperplasia)   . TIA (transient ischemic attack) ~ 2011    denies residual (12/11/2011)  . Hypertension   . Hyperlipidemia   . Basal cell carcinoma of cheek ~ 1973    left  . Atrial flutter     s/p CTI ablation by Dr Rayann Heman 10/12, 9/13   Past Surgical History  Procedure Laterality Date  . Basal cell carcinoma excision  ~ 1973    left cheek  . Total hip arthroplasty      left  . Atrial ablation surgery  10/12, 9/13 (repeat)    CTI ablation for atrial flutter by Dr Rayann Heman  . Cardiac catheterization  1960's  . A flutter ablation  12/11/2011  . Tonsillectomy and adenoidectomy  1947  . Joint replacement    . Hernia repair  ~ 7342    umbilical  . Atrial flutter ablation N/A 12/11/2011    Procedure: ATRIAL FLUTTER ABLATION;  Surgeon: Thompson Grayer, MD;  Location: [facility] CATH LAB;  Service: Cardiovascular;  Laterality: N/A;    Current Outpatient Prescriptions  Medication Sig Dispense Refill  . apixaban (ELIQUIS) 5 MG TABS tablet Take 1 tablet (5 mg total) by mouth 2 (two) times daily. 10 tablet 0  . doxylamine, Sleep, (UNISOM) 25 MG tablet Take 25  mg by mouth at bedtime as needed for sleep.     Marland Kitchen lisinopril (PRINIVIL,ZESTRIL) 5 MG tablet Take 1 tablet (5 mg total) by mouth daily. 30 tablet 0  . simvastatin (ZOCOR) 40 MG tablet Take 1 tablet (40 mg total) by mouth at bedtime. 90 tablet 3   No current facility-administered medications for this visit.    Physical Exam: Filed Vitals:   03/20/14 1018  BP: 140/70  Pulse: 58  Height: 5\' 10"  (1.778 m)  Weight: 181 lb (82.101 kg)    GEN- The patient is well appearing, alert and oriented x 3 today.   Head- normocephalic, atraumatic Eyes-  Sclera clear, conjunctiva pink Ears- hearing intact Oropharynx- clear Lungs- Clear to ausculation bilaterally, normal work of breathing Heart- Regular rate and rhythm, no murmurs, rubs or gallops, PMI not laterally displaced GI- soft, NT, ND, + BS Extremities- no clubbing, cyanosis, or edema  Ekg today reveals sinus brady at 58 bpm with first degree block, nonspecific intraventricular block.   Assessment and Plan:  1. TIA Continue apixaban with previous stroke by MRI and  TIA 9/15.  2. H/O aflutter ablation No apparent arrythmia. No change in treatment indicated.  Recheck in 6 months.   Thompson Grayer MD

## 2014-03-20 NOTE — Patient Instructions (Signed)
Your physician recommends that you return for FASTING lab work: 2 months after starting zocor (simvastatin). Please call the office and let us know when you start this so we can schedule your follow up lab work.  Your physician wants you to follow-up in: 6 months with Roderic Palau, NP in the A-Fib clinic & 1 year with Dr. Rayann Heman. You will receive a reminder letter in the mail two months in advance. If you don't receive a letter, please call our office to schedule the follow-up appointment.

## 2014-03-27 ENCOUNTER — Other Ambulatory Visit: Payer: Self-pay

## 2014-03-27 MED ORDER — LISINOPRIL 5 MG PO TABS: 5.0000 mg | ORAL_TABLET | Freq: Every day | ORAL | Status: DC

## 2014-03-27 MED ORDER — APIXABAN 5 MG PO TABS: 5.0000 mg | ORAL_TABLET | Freq: Two times a day (BID) | ORAL | Status: DC

## 2014-03-27 MED ORDER — SIMVASTATIN 40 MG PO TABS: 40.0000 mg | ORAL_TABLET | Freq: Every day | ORAL | Status: DC

## 2014-09-11 ENCOUNTER — Other Ambulatory Visit: Payer: Self-pay

## 2014-09-25 ENCOUNTER — Telehealth: Payer: Self-pay | Admitting: Internal Medicine

## 2014-09-25 NOTE — Telephone Encounter (Signed)
New Message  Pt calling about lab work to do for possible change in cholesterol medication. Pt has orders from January/2016. Pt requested to speak w/ RN to see if it is still necessary. Please call back and discuss.

## 2014-09-26 NOTE — Telephone Encounter (Signed)
Spoke with wife and patient is scheduled for tomorrow for labs.  She knows he is to fast

## 2014-09-26 NOTE — Telephone Encounter (Signed)
Follow Up     Pt calling about getting cholesterol check. Please call back and advise.

## 2014-09-27 ENCOUNTER — Other Ambulatory Visit (INDEPENDENT_AMBULATORY_CARE_PROVIDER_SITE_OTHER): Payer: PPO | Admitting: *Deleted

## 2014-09-27 DIAGNOSIS — E785 Hyperlipidemia, unspecified: Secondary | ICD-10-CM | POA: Diagnosis not present

## 2014-09-27 LAB — LIPID PANEL
Cholesterol: 192 mg/dL (ref 0–200)
HDL: 61.7 mg/dL (ref >39.00)
LDL Cholesterol: 113 mg/dL — ABNORMAL HIGH (ref 0–99)
NonHDL: 130.3
Total CHOL/HDL Ratio: 3
Triglycerides: 87 mg/dL (ref 0.0–149.0)
VLDL: 17.4 mg/dL (ref 0.0–40.0)

## 2014-09-27 LAB — HEPATIC FUNCTION PANEL
ALT: 15 U/L (ref 0–53)
AST: 21 U/L (ref 0–37)
Albumin: 3.9 g/dL (ref 3.5–5.2)
Alkaline Phosphatase: 24 U/L — ABNORMAL LOW (ref 39–117)
Bilirubin, Direct: 0.1 mg/dL (ref 0.0–0.3)
Total Bilirubin: 0.6 mg/dL (ref 0.2–1.2)
Total Protein: 6.4 g/dL (ref 6.0–8.3)

## 2014-09-29 ENCOUNTER — Telehealth: Payer: Self-pay | Admitting: Internal Medicine

## 2014-09-29 NOTE — Telephone Encounter (Signed)
Had been on Vytorin and switched to Simvastatin 40 mg about 90 days ago.  Labs are clearly worse and he has 2 weeks left on medication currently on and wants to know Dr Jackalyn Lombard recommendations for medication.  Let him know I would discuss with him when he is back on 10/09/14 and call him back.

## 2014-09-29 NOTE — Telephone Encounter (Signed)
New Message  Pt calling about lab results. Please call back and discuss.  

## 2014-10-11 NOTE — Telephone Encounter (Signed)
Please discuss with Gay Filler to see if he would be appropriate for lipid clinic

## 2014-10-12 NOTE — Telephone Encounter (Signed)
Pt stopped Vytorin due to cost.  Would suggest changing from simvastatin 40mg  to Lipitor 40mg .  This will give [place] an additional LDL lowering effect and is a generic.  LDL will likely not decrase back to where it was on Lipitor alone.

## 2014-12-22 ENCOUNTER — Other Ambulatory Visit: Payer: Self-pay | Admitting: *Deleted

## 2014-12-22 MED ORDER — LISINOPRIL 5 MG PO TABS: 5.0000 mg | ORAL_TABLET | Freq: Every day | ORAL | Status: DC

## 2014-12-27 ENCOUNTER — Encounter (HOSPITAL_COMMUNITY): Payer: Self-pay | Admitting: Nurse Practitioner

## 2014-12-27 ENCOUNTER — Ambulatory Visit (INDEPENDENT_AMBULATORY_CARE_PROVIDER_SITE_OTHER): Payer: PPO | Admitting: *Deleted

## 2014-12-27 ENCOUNTER — Ambulatory Visit (HOSPITAL_COMMUNITY)
Admission: RE | Admit: 2014-12-27 | Discharge: 2014-12-27 | Disposition: A | Discharge status: Home / Self Care | Payer: PPO | Source: Ambulatory Visit | Attending: Nurse Practitioner | Admitting: Nurse Practitioner

## 2014-12-27 VITALS — BP 142/78 | HR 58 | Ht 70.0 in | Wt 172.6 lb

## 2014-12-27 DIAGNOSIS — I10 Essential (primary) hypertension: Secondary | ICD-10-CM | POA: Insufficient documentation

## 2014-12-27 DIAGNOSIS — I4892 Unspecified atrial flutter: Secondary | ICD-10-CM | POA: Diagnosis present

## 2014-12-27 DIAGNOSIS — Z23 Encounter for immunization: Secondary | ICD-10-CM

## 2014-12-27 NOTE — Progress Notes (Signed)
Patient ID: [name], male   DOB: 07-28-1940, 74 y.o.   MRN: [id number]     Primary Care Physician: Anthony Fraction, MD Referring Physician: Dr. Kinnie [name] is a 74 y.o. male with a h/o aflutter ablation x 2, for f/u in the afib clinic. He reports that he feels well and has not noticed any irregular heart beat. He continues on apixaban with h/o prior stroke.  Today, he denies symptoms of palpitations, chest pain, shortness of breath, orthopnea, PND, lower extremity edema, dizziness, presyncope, syncope, or neurologic sequela. The patient is tolerating medications without difficulties and is otherwise without complaint today.   Past Medical History  Diagnosis Date  . Hypertrophic cardiomyopathy ([place])   . Diverticulosis   . Hemorrhoids   . BPH (benign prostatic hyperplasia)   . TIA (transient ischemic attack) ~ 2011    denies residual (12/11/2011)  . Hypertension   . Hyperlipidemia   . Basal cell carcinoma of cheek ~ 1973    left  . Atrial flutter [facility])     s/p CTI ablation by Dr [name] 10/12, 9/13   Past Surgical History  Procedure Laterality Date  . Basal cell carcinoma excision  ~ 1973    left cheek  . Total hip arthroplasty      left  . Atrial ablation surgery  10/12, 9/13 (repeat)    CTI ablation for atrial flutter by Dr [name]  . Cardiac catheterization  1960's  . A flutter ablation  12/11/2011  . Tonsillectomy and adenoidectomy  1947  . Joint replacement    . Hernia repair  ~ 9518    umbilical  . Atrial flutter ablation N/A 12/11/2011    Procedure: ATRIAL FLUTTER ABLATION;  Surgeon: Anthony Grayer, MD;  Location: [facility] CATH LAB;  Service: Cardiovascular;  Laterality: N/A;    Current Outpatient Prescriptions  Medication Sig Dispense Refill  . apixaban (ELIQUIS) 5 MG TABS tablet Take 1 tablet (5 mg total) by mouth 2 (two) times daily. 180 tablet 2  . doxylamine, Sleep, (UNISOM) 25 MG tablet Take 25 mg by mouth at bedtime as needed for sleep.     Marland Kitchen  lisinopril (PRINIVIL,ZESTRIL) 5 MG tablet Take 1 tablet (5 mg total) by mouth daily. 30 tablet 0  . simvastatin (ZOCOR) 40 MG tablet Take 1 tablet (40 mg total) by mouth at bedtime. 90 tablet 3   No current facility-administered medications for this encounter.    Allergies  Allergen Reactions  . Penicillins Other (See Comments)    RXN: unknown; "told I might have allergy 1960's"    Social History   Social History  . Marital Status: Married    Spouse Name: N/A  . Number of Children: 2  . Years of Education: N/A   Occupational History  . retired    Social History Main Topics  . Smoking status: Former Smoker -- 2.00 packs/day for 10 years    Types: Cigarettes    Quit date: 03/17/1970  . Smokeless tobacco: Never Used  . Alcohol Use: Yes     Comment: quit 1995  . Drug Use: No  . Sexual Activity: Yes   Other Topics Concern  . Not on file   Social History Narrative   Lives in [place] with spouse.  2 Grown children.  Retired from Data processing manager    Family History  Problem Relation Age of Onset  . Colon polyps Brother   . Tuberculosis Brother     and father  .  Breast cancer Mother     ROS- All systems are reviewed and negative except as per the HPI above  Physical Exam: Filed Vitals:   12/27/14 1000  BP: 142/78  Pulse: 58  Height: 5\' 10"  (1.778 m)  Weight: 172 lb 9.6 oz (78.291 kg)    GEN- The patient is well appearing, alert and oriented x 3 today.   Head- normocephalic, atraumatic Eyes-  Sclera clear, conjunctiva pink Ears- hearing intact Oropharynx- clear Neck- supple, no JVP Lymph- no cervical lymphadenopathy Lungs- Clear to ausculation bilaterally, normal work of breathing Heart- Regular rate and rhythm, no murmurs, rubs or gallops, PMI not laterally displaced GI- soft, NT, ND, + BS Extremities- no clubbing, cyanosis, or edema MS- no significant deformity or atrophy Skin- no rash or lesion Psych- euthymic mood, full affect Neuro-  strength and sensation are intact  EKG- SB with first degree AVB, 58 bpm, RBBB, Inferior infarct, pr int 276 ms, QRS 132 ms, QTc 477 ms  Assessment and Plan: 1. Aflutter s/p ablation x 2 In SR  Continue apixaban  2. HTN  Continue lisinopril  F/u with Dr. Rayann [name] in 6 months  [name]. [name], [facility] [address] [place], [place] [address] [phone]

## 2014-12-27 NOTE — Patient Instructions (Signed)
Your physician wants you to follow-up in: 6 months with Dr. Allred. You will receive a reminder letter in the mail two months in advance. If you don't receive a letter, please call our office to schedule the follow-up appointment.  

## 2014-12-31 ENCOUNTER — Encounter: Payer: Self-pay | Admitting: Internal Medicine

## 2015-01-01 ENCOUNTER — Ambulatory Visit: Payer: PPO | Admitting: Family Medicine

## 2015-01-02 ENCOUNTER — Other Ambulatory Visit: Payer: Self-pay

## 2015-01-02 MED ORDER — LISINOPRIL 5 MG PO TABS: 5.0000 mg | ORAL_TABLET | Freq: Every day | ORAL | Status: DC

## 2015-01-03 ENCOUNTER — Other Ambulatory Visit: Payer: Self-pay

## 2015-01-03 MED ORDER — LISINOPRIL 5 MG PO TABS: 5.0000 mg | ORAL_TABLET | Freq: Every day | ORAL | Status: DC

## 2015-01-05 ENCOUNTER — Other Ambulatory Visit: Payer: PPO

## 2015-01-05 DIAGNOSIS — G459 Transient cerebral ischemic attack, unspecified: Secondary | ICD-10-CM

## 2015-01-05 DIAGNOSIS — Z0000 Encounter for general adult medical examination without abnormal findings: Secondary | ICD-10-CM

## 2015-01-05 DIAGNOSIS — E785 Hyperlipidemia, unspecified: Secondary | ICD-10-CM

## 2015-01-05 DIAGNOSIS — I10 Essential (primary) hypertension: Secondary | ICD-10-CM

## 2015-01-05 DIAGNOSIS — Z7960 Long term (current) use of unspecified immunomodulators and immunosuppressants: Secondary | ICD-10-CM

## 2015-01-05 DIAGNOSIS — N400 Enlarged prostate without lower urinary tract symptoms: Secondary | ICD-10-CM

## 2015-01-05 DIAGNOSIS — Z79899 Other long term (current) drug therapy: Secondary | ICD-10-CM

## 2015-01-05 LAB — COMPLETE METABOLIC PANEL WITH GFR
ALT: 15 U/L (ref 9–46)
AST: 22 U/L (ref 10–35)
Albumin: 4 g/dL (ref 3.6–5.1)
Alkaline Phosphatase: 23 U/L — ABNORMAL LOW (ref 40–115)
BUN: 18 mg/dL (ref 7–25)
CO2: 25 mmol/L (ref 20–31)
Calcium: 9 mg/dL (ref 8.6–10.3)
Chloride: 104 mmol/L (ref 98–110)
Creat: 1.03 mg/dL (ref 0.70–1.18)
GFR, Est African American: 82 mL/min (ref >=60)
GFR, Est Non African American: 71 mL/min (ref >=60)
Glucose, Bld: 90 mg/dL (ref 70–99)
Potassium: 4.7 mmol/L (ref 3.5–5.3)
Sodium: 139 mmol/L (ref 135–146)
Total Bilirubin: 0.6 mg/dL (ref 0.2–1.2)
Total Protein: 6.3 g/dL (ref 6.1–8.1)

## 2015-01-05 LAB — LIPID PANEL
Cholesterol: 196 mg/dL (ref 125–200)
HDL: 65 mg/dL (ref >=40)
LDL Cholesterol: 115 mg/dL (ref <130)
Total CHOL/HDL Ratio: 3 Ratio (ref <=5.0)
Triglycerides: 82 mg/dL (ref <150)
VLDL: 16 mg/dL (ref <30)

## 2015-01-05 LAB — CBC WITH DIFFERENTIAL/PLATELET
Basophils Absolute: 0 10*3/uL (ref 0.0–0.1)
Basophils Relative: 0 % (ref 0–1)
Eosinophils Absolute: 0.1 10*3/uL (ref 0.0–0.7)
Eosinophils Relative: 2 % (ref 0–5)
HCT: 43.1 % (ref 39.0–52.0)
Hemoglobin: 14.4 g/dL (ref 13.0–17.0)
Lymphocytes Relative: 37 % (ref 12–46)
Lymphs Abs: 2.3 10*3/uL (ref 0.7–4.0)
MCH: 30.1 pg (ref 26.0–34.0)
MCHC: 33.4 g/dL (ref 30.0–36.0)
MCV: 90.2 fL (ref 78.0–100.0)
MPV: 10.9 fL (ref 8.6–12.4)
Monocytes Absolute: 0.7 10*3/uL (ref 0.1–1.0)
Monocytes Relative: 11 % (ref 3–12)
Neutro Abs: 3.2 10*3/uL (ref 1.7–7.7)
Neutrophils Relative %: 50 % (ref 43–77)
Platelets: 201 10*3/uL (ref 150–400)
RBC: 4.78 MIL/uL (ref 4.22–5.81)
RDW: 13.4 % (ref 11.5–15.5)
WBC: 6.3 10*3/uL (ref 4.0–10.5)

## 2015-01-05 LAB — TSH: TSH: 2.585 u[IU]/mL (ref 0.350–4.500)

## 2015-01-06 LAB — PSA, MEDICARE: PSA: 3.02 ng/mL (ref <=4.00)

## 2015-01-12 ENCOUNTER — Encounter: Payer: Self-pay | Admitting: Family Medicine

## 2015-01-12 ENCOUNTER — Ambulatory Visit (INDEPENDENT_AMBULATORY_CARE_PROVIDER_SITE_OTHER): Payer: PPO | Admitting: Family Medicine

## 2015-01-12 VITALS — BP 156/90 | HR 50 | Temp 97.5°F | Resp 14 | Ht 70.0 in | Wt 172.0 lb

## 2015-01-12 DIAGNOSIS — I10 Essential (primary) hypertension: Secondary | ICD-10-CM

## 2015-01-12 MED ORDER — LISINOPRIL 20 MG PO TABS
20.0000 mg | ORAL_TABLET | Freq: Every day | ORAL | Status: DC
Start: 2015-01-12 — End: 2015-02-20

## 2015-01-12 NOTE — Progress Notes (Signed)
Subjective:    Patient ID: [name], male    DOB: 02/26/1941, 74 y.o.   MRN: [id number]  HPI    Patient presents for Medicare Annual/Subsequent preventive examination. Patient has no medical concerns.  His blood pressure has been elevated recently in the 140-150 range over 70-80. I confirmed this today personally. Otherwise he is been doing fine.His most recent lab work as listed below.  Lab on 01/05/2015  Component Date Value Ref Range Status  . PSA 01/05/2015 3.02  <=4.00 ng/mL Final   Comment: Test Methodology: ECLIA PSA (Electrochemiluminescence Immunoassay)   For PSA values from 2.5-4.0, particularly in younger men <44 years old, the AUA and NCCN suggest testing for % Free PSA (3515) and evaluation of the rate of increase in PSA (PSA velocity).   . WBC 01/05/2015 6.3  4.0 - 10.5 K/uL Final  . RBC 01/05/2015 4.78  4.22 - 5.81 MIL/uL Final  . Hemoglobin 01/05/2015 14.4  13.0 - 17.0 g/dL Final  . HCT 01/05/2015 43.1  39.0 - 52.0 % Final  . MCV 01/05/2015 90.2  78.0 - 100.0 fL Final  . MCH 01/05/2015 30.1  26.0 - 34.0 pg Final  . MCHC 01/05/2015 33.4  30.0 - 36.0 g/dL Final  . RDW 01/05/2015 13.4  11.5 - 15.5 % Final  . Platelets 01/05/2015 201  150 - 400 K/uL Final  . MPV 01/05/2015 10.9  8.6 - 12.4 fL Final  . Neutrophils Relative % 01/05/2015 50  43 - 77 % Final  . Neutro Abs 01/05/2015 3.2  1.7 - 7.7 K/uL Final  . Lymphocytes Relative 01/05/2015 37  12 - 46 % Final  . Lymphs Abs 01/05/2015 2.3  0.7 - 4.0 K/uL Final  . Monocytes Relative 01/05/2015 11  3 - 12 % Final  . Monocytes Absolute 01/05/2015 0.7  0.1 - 1.0 K/uL Final  . Eosinophils Relative 01/05/2015 2  0 - 5 % Final  . Eosinophils Absolute 01/05/2015 0.1  0.0 - 0.7 K/uL Final  . Basophils Relative 01/05/2015 0  0 - 1 % Final  . Basophils Absolute 01/05/2015 0.0  0.0 - 0.1 K/uL Final  . Smear Review 01/05/2015 Criteria for review not met   Final  . Cholesterol 01/05/2015 196  125 - 200 mg/dL Final  .  Triglycerides 01/05/2015 82  <150 mg/dL Final  . HDL 01/05/2015 65  >=40 mg/dL Final  . Total CHOL/HDL Ratio 01/05/2015 3.0  <=5.0 Ratio Final  . VLDL 01/05/2015 16  <30 mg/dL Final  . LDL Cholesterol 01/05/2015 115  <130 mg/dL Final   Comment:   Total Cholesterol/HDL Ratio:CHD Risk                        Coronary Heart Disease Risk Table                                        Men       Women          1/2 Average Risk              3.4        3.3              Average Risk              5.0        4.4  2X Average Risk              9.6        7.1           3X Average Risk             23.4       11.0 Use the calculated Patient Ratio above and the CHD Risk table  to determine the patient's CHD Risk.   Marland Kitchen TSH 01/05/2015 2.585  0.350 - 4.500 uIU/mL Final  . Sodium 01/05/2015 139  135 - 146 mmol/L Final  . Potassium 01/05/2015 4.7  3.5 - 5.3 mmol/L Final  . Chloride 01/05/2015 104  98 - 110 mmol/L Final  . CO2 01/05/2015 25  20 - 31 mmol/L Final  . Glucose, Bld 01/05/2015 90  70 - 99 mg/dL Final  . BUN 01/05/2015 18  7 - 25 mg/dL Final  . Creat 01/05/2015 1.03  0.70 - 1.18 mg/dL Final  . Total Bilirubin 01/05/2015 0.6  0.2 - 1.2 mg/dL Final  . Alkaline Phosphatase 01/05/2015 23* 40 - 115 U/L Final  . AST 01/05/2015 22  10 - 35 U/L Final  . ALT 01/05/2015 15  9 - 46 U/L Final  . Total Protein 01/05/2015 6.3  6.1 - 8.1 g/dL Final  . Albumin 01/05/2015 4.0  3.6 - 5.1 g/dL Final  . Calcium 01/05/2015 9.0  8.6 - 10.3 mg/dL Final  . GFR, Est African American 01/05/2015 82  >=60 mL/min Final  . GFR, Est Non African American 01/05/2015 71  >=60 mL/min Final   Comment:   The estimated GFR is a calculation valid for adults (>=70 years old) that uses the CKD-EPI algorithm to adjust for age and sex. It is   not to be used for children, pregnant women, hospitalized patients,    patients on dialysis, or with rapidly changing kidney function. According to the NKDEP, eGFR >89 is normal, 60-89  shows mild impairment, 30-59 shows moderate impairment, 15-29 shows severe impairment and <15 is ESRD.        Review Past Medical/Family/Social:  Past Medical History  Diagnosis Date  . Hypertrophic cardiomyopathy ([place])   . Diverticulosis   . Hemorrhoids   . BPH (benign prostatic hyperplasia)   . TIA (transient ischemic attack) ~ 2011    denies residual (12/11/2011)  . Hypertension   . Hyperlipidemia   . Basal cell carcinoma of cheek ~ 1973    left  . Atrial flutter [facility])     s/p CTI ablation by Dr Rayann Heman 10/12, 9/13   Past Surgical History  Procedure Laterality Date  . Basal cell carcinoma excision  ~ 1973    left cheek  . Total hip arthroplasty      left  . Atrial ablation surgery  10/12, 9/13 (repeat)    CTI ablation for atrial flutter by Dr Rayann Heman  . Cardiac catheterization  1960's  . A flutter ablation  12/11/2011  . Tonsillectomy and adenoidectomy  1947  . Joint replacement    . Hernia repair  ~ 1610    umbilical  . Atrial flutter ablation N/A 12/11/2011    Procedure: ATRIAL FLUTTER ABLATION;  Surgeon: Thompson Grayer, MD;  Location: [facility] CATH LAB;  Service: Cardiovascular;  Laterality: N/A;   Current Outpatient Prescriptions on File Prior to Visit  Medication Sig Dispense Refill  . apixaban (ELIQUIS) 5 MG TABS tablet Take 1 tablet (5 mg total) by mouth 2 (two) times daily. 180 tablet 2  .  doxylamine, Sleep, (UNISOM) 25 MG tablet Take 25 mg by mouth at bedtime as needed for sleep.     Marland Kitchen lisinopril (PRINIVIL,ZESTRIL) 5 MG tablet Take 1 tablet (5 mg total) by mouth daily. 90 tablet 3  . simvastatin (ZOCOR) 40 MG tablet Take 1 tablet (40 mg total) by mouth at bedtime. 90 tablet 3   No current facility-administered medications on file prior to visit.   Allergies  Allergen Reactions  . Penicillins Other (See Comments)    RXN: unknown; "told I might have allergy 1960's"   Social History   Social History  . Marital Status: Married    Spouse Name: N/A  . Number of  Children: 2  . Years of Education: N/A   Occupational History  . retired    Social History Main Topics  . Smoking status: Former Smoker -- 2.00 packs/day for 10 years    Types: Cigarettes    Quit date: 03/17/1970  . Smokeless tobacco: Never Used  . Alcohol Use: Yes     Comment: quit 1995  . Drug Use: No  . Sexual Activity: Yes   Other Topics Concern  . Not on file   Social History Narrative   Lives in [place] with spouse.  2 Grown children.  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 Colon polyps Brother   . Tuberculosis Brother     and father  . Breast cancer Mother     Depression Screen  (Note: if answer to either of the following is "Yes", a more complete depression screening is indicated)  Over the past two weeks, have you felt down, depressed or hopeless? No Over the past two weeks, have you felt little interest or pleasure in doing things? No Have you lost interest or pleasure in daily life? No Do you often feel hopeless? No Do you cry easily over simple problems? No   Activities of Daily Living  In your present state of health, do you have any difficulty performing the following activities?:  Driving? No  Managing money? No  Feeding yourself? No  Getting from bed to chair? No  Climbing a flight of stairs? No  Preparing food and eating?: No  Bathing or showering? No  Getting dressed: No  Getting to the toilet? No  Using the toilet:No  Moving around from place to place: No  In the past year have you fallen or had a near fall?:No  Are you sexually active? No  Do you have more than one partner? No   Hearing Difficulties: No  Do you often ask people to speak up or repeat themselves? No  Do you experience ringing or noises in your ears? No Do you have difficulty understanding soft or whispered voices? No  Do you feel that you have a problem with memory? No Do you often misplace items? No  Do you feel safe  at home? Yes  Cognitive Testing  Alert? Yes Normal Appearance?Yes  Oriented to person? Yes Place? Yes  Time? Yes  Recall of three objects? Yes  Can perform simple calculations? Yes  Displays appropriate judgment?Yes  Can read the correct time from a watch face?Yes   Screening Tests / Date Colonoscopy       2012 (q 5 years)              Zostavax UTD Pneumovax 23- UTD Influenza Vaccine UTD Tetanus/tdap 2013        Review of Systems  All other systems reviewed and are negative.      Objective:   Physical Exam  Constitutional: He is oriented to person, place, and time. He appears well-developed and well-nourished. No distress.  HENT:  Head: Normocephalic and atraumatic.  Right Ear: External ear normal.  Left Ear: External ear normal.  Nose: Nose normal.  Mouth/Throat: Oropharynx is clear and moist. No oropharyngeal exudate.  Eyes: Conjunctivae and EOM are normal. Pupils are equal, round, and reactive to light. Right eye exhibits no discharge. Left eye exhibits no discharge. No scleral icterus.  Neck: Normal range of motion. Neck supple. No JVD present. No tracheal deviation present. No thyromegaly present.  Cardiovascular: Normal rate, regular rhythm, normal heart sounds and intact distal pulses.  Exam reveals no gallop and no friction rub.   No murmur heard. Pulmonary/Chest: Effort normal and breath sounds normal. No stridor. No respiratory distress. He has no wheezes. He has no rales. He exhibits no tenderness.  Abdominal: Soft. Bowel sounds are normal. He exhibits no distension and no mass. There is no tenderness. There is no rebound and no guarding.  Genitourinary: Rectum normal, prostate normal and penis normal.  Musculoskeletal: Normal range of motion. He exhibits no edema or tenderness.  Lymphadenopathy:    He has no cervical adenopathy.  Neurological: He is alert and oriented to person, place, and time. He has normal reflexes. No cranial nerve deficit. He exhibits  normal muscle tone. Coordination normal.  Skin: Skin is warm. No rash noted. He is not diaphoretic. No erythema. No pallor.  Psychiatric: He has a normal mood and affect. His behavior is normal. Judgment and thought content normal.  Vitals reviewed.         Assessment & Plan:  1. Routine general medical examination at a health care facility   increase lisinopril to 20 mg a day and recheck blood pressure in one month. Otherwise cancer screening is up-to-date , colonoscopy is up-to-date, immunizations are up-to-date. PSA has fallen from 3.7-3.07. Recheck in one year. Screen negative for depression.  Medicare Attestation  I have personally reviewed:  The patient's medical and social history  Their use of alcohol, tobacco or illicit drugs  Their current medications and supplements  The patient's functional ability including ADLs,fall risks, home safety risks, cognitive, and hearing and visual impairment  Diet and physical activities  Evidence for depression or mood disorders  The patient's weight, height, BMI, and visual acuity have been recorded in the chart. I have made referrals, counseling, and provided education to the patient based on review of the above and I have provided the patient with a written personalized care plan for preventive services.

## 2015-01-20 ENCOUNTER — Other Ambulatory Visit: Payer: Self-pay | Admitting: Internal Medicine

## 2015-02-20 ENCOUNTER — Ambulatory Visit: Payer: PPO | Admitting: Family Medicine

## 2015-02-20 VITALS — BP 126/64

## 2015-02-20 DIAGNOSIS — I10 Essential (primary) hypertension: Secondary | ICD-10-CM

## 2015-02-20 MED ORDER — LISINOPRIL 10 MG PO TABS: 10.0000 mg | ORAL_TABLET | Freq: Every day | ORAL | Status: DC

## 2015-02-20 NOTE — Progress Notes (Signed)
Pt came for nurse visit BP check.  BP 124 64.  Brought list of BP's from home.  BP's were reviewed by Dr Dennard Schaumann.  Per Dr Dennard Schaumann, pt to take Lisinopril 10 mg  A day.  90 day refills to mail order.

## 2015-02-21 ENCOUNTER — Ambulatory Visit (INDEPENDENT_AMBULATORY_CARE_PROVIDER_SITE_OTHER): Payer: PPO | Admitting: Internal Medicine

## 2015-02-21 ENCOUNTER — Encounter: Payer: Self-pay | Admitting: Internal Medicine

## 2015-02-21 VITALS — BP 126/80 | HR 54 | Ht 70.0 in | Wt 173.8 lb

## 2015-02-21 DIAGNOSIS — G459 Transient cerebral ischemic attack, unspecified: Secondary | ICD-10-CM | POA: Diagnosis not present

## 2015-02-21 DIAGNOSIS — I483 Typical atrial flutter: Secondary | ICD-10-CM | POA: Diagnosis not present

## 2015-02-21 DIAGNOSIS — I422 Other hypertrophic cardiomyopathy: Secondary | ICD-10-CM

## 2015-02-21 MED ORDER — APIXABAN 5 MG PO TABS: 5.0000 mg | ORAL_TABLET | Freq: Two times a day (BID) | ORAL | Status: DC

## 2015-02-21 NOTE — Patient Instructions (Signed)
Medication Instructions:  Your physician recommends that you continue on your current medications as directed. Please refer to the Current Medication list given to you today.   Labwork: None ordered   Testing/Procedures: Your physician has requested that you have an echocardiogram. Echocardiography is a painless test that uses sound waves to create images of your heart. It provides your doctor with information about the size and shape of your heart and how well your heart's chambers and valves are working. This procedure takes approximately one hour. There are no restrictions for this procedure.     Follow-Up: Your physician wants you to follow-up in: 12 months with Dr Allred You will receive a reminder letter in the mail two months in advance. If you don't receive a letter, please call our office to schedule the follow-up appointment.   Any Other Special Instructions Will Be Listed Below (If Applicable).     If you need a refill on your cardiac medications before your next appointment, please call your pharmacy.   

## 2015-02-21 NOTE — Progress Notes (Signed)
PCP: Odette Fraction, MD Primary Cardiologist:  previously Dr Pollyann Glen is a 74 y.o. male who presents today for EP follow up with h/o ablation x 2  for recurrent atrial flutter 10/12, 9/13, and very transient TIA  9/15. He has had no further atrial flutter that he is aware of, nor TIA symptoms. He remains very active.  Tolerating eliquis.  Today, he denies symptoms of palpitations, chest pain, shortness of breath,  lower extremity edema, dizziness, presyncope, or syncope.  The patient is otherwise without complaint today.   Past Medical History  Diagnosis Date  . Hypertrophic cardiomyopathy ([place])   . Diverticulosis   . Hemorrhoids   . BPH (benign prostatic hyperplasia)   . TIA (transient ischemic attack) ~ 2011    denies residual (12/11/2011)  . Hypertension   . Hyperlipidemia   . Basal cell carcinoma of cheek ~ 1973    left  . Atrial flutter [facility])     s/p CTI ablation by Dr Rayann Heman 10/12, 9/13   Past Surgical History  Procedure Laterality Date  . Basal cell carcinoma excision  ~ 1973    left cheek  . Total hip arthroplasty      left  . Atrial ablation surgery  10/12, 9/13 (repeat)    CTI ablation for atrial flutter by Dr Rayann Heman  . Cardiac catheterization  1960's  . A flutter ablation  12/11/2011  . Tonsillectomy and adenoidectomy  1947  . Joint replacement    . Hernia repair  ~ [id number]    umbilical  . Atrial flutter ablation N/A 12/11/2011    Procedure: ATRIAL FLUTTER ABLATION;  Surgeon: Thompson Grayer, MD;  Location: [facility] CATH LAB;  Service: Cardiovascular;  Laterality: N/A;    Current Outpatient Prescriptions  Medication Sig Dispense Refill  . apixaban (ELIQUIS) 5 MG TABS tablet Take 1 tablet (5 mg total) by mouth 2 (two) times daily. 180 tablet 3  . doxylamine, Sleep, (UNISOM) 25 MG tablet Take 25 mg by mouth at bedtime as needed for sleep.     Marland Kitchen lisinopril (PRINIVIL,ZESTRIL) 10 MG tablet Take 1 tablet (10 mg total) by mouth daily. 90 tablet 1  . simvastatin  (ZOCOR) 40 MG tablet Take 1 tablet (40 mg total) by mouth at bedtime. 90 tablet 3   No current facility-administered medications for this visit.    Physical Exam: Filed Vitals:   02/21/15 0938  BP: 126/80  Pulse: 54  Height: 5\' 10"  (1.778 m)  Weight: 173 lb 12.8 oz (78.835 kg)    GEN- The patient is well appearing, alert and oriented x 3 today.   Head- normocephalic, atraumatic Eyes-  Sclera clear, conjunctiva pink Ears- hearing intact Oropharynx- clear Lungs- Clear to ausculation bilaterally, normal work of breathing Heart- Regular rate and rhythm, no murmurs, rubs or gallops, PMI not laterally displaced GI- soft, NT, ND, + BS Extremities- no clubbing, cyanosis, or edema  Ekg today reveals sinus brady at 54 bpm with first degree block PR 284 msec, nonspecific intraventricular block.with poor R wave progression and LAD unchanged from prior   Assessment and Plan:  1. TIA Continue apixaban with previous stroke by MRI and  TIA 9/15.  2. H/O aflutter ablation No apparent arrythmia. No change in treatment indicated.  3, Hypertrophic CM/ previously reduced EF On lisinopril Repeat echo at this time  Recheck in 12 months.  Thompson Grayer MD, [facility] 02/21/2015 10:08 AM

## 2015-02-28 ENCOUNTER — Other Ambulatory Visit: Payer: Self-pay | Admitting: Internal Medicine

## 2015-03-05 ENCOUNTER — Other Ambulatory Visit: Payer: Self-pay

## 2015-03-05 ENCOUNTER — Ambulatory Visit (HOSPITAL_COMMUNITY): Payer: PPO | Attending: Cardiovascular Disease

## 2015-03-05 DIAGNOSIS — Z87891 Personal history of nicotine dependence: Secondary | ICD-10-CM | POA: Diagnosis not present

## 2015-03-05 DIAGNOSIS — I340 Nonrheumatic mitral (valve) insufficiency: Secondary | ICD-10-CM | POA: Insufficient documentation

## 2015-03-05 DIAGNOSIS — I351 Nonrheumatic aortic (valve) insufficiency: Secondary | ICD-10-CM | POA: Insufficient documentation

## 2015-03-05 DIAGNOSIS — N400 Enlarged prostate without lower urinary tract symptoms: Secondary | ICD-10-CM | POA: Insufficient documentation

## 2015-03-05 DIAGNOSIS — I422 Other hypertrophic cardiomyopathy: Secondary | ICD-10-CM | POA: Diagnosis not present

## 2015-03-05 DIAGNOSIS — I371 Nonrheumatic pulmonary valve insufficiency: Secondary | ICD-10-CM | POA: Insufficient documentation

## 2015-03-05 DIAGNOSIS — I517 Cardiomegaly: Secondary | ICD-10-CM | POA: Diagnosis not present

## 2015-03-05 DIAGNOSIS — I10 Essential (primary) hypertension: Secondary | ICD-10-CM | POA: Insufficient documentation

## 2015-03-05 DIAGNOSIS — I421 Obstructive hypertrophic cardiomyopathy: Secondary | ICD-10-CM | POA: Diagnosis present

## 2015-04-24 DIAGNOSIS — L570 Actinic keratosis: Secondary | ICD-10-CM | POA: Diagnosis not present

## 2015-04-24 DIAGNOSIS — L820 Inflamed seborrheic keratosis: Secondary | ICD-10-CM | POA: Diagnosis not present

## 2015-04-24 DIAGNOSIS — Z85820 Personal history of malignant melanoma of skin: Secondary | ICD-10-CM | POA: Diagnosis not present

## 2015-04-24 DIAGNOSIS — D225 Melanocytic nevi of trunk: Secondary | ICD-10-CM | POA: Diagnosis not present

## 2015-04-24 DIAGNOSIS — L821 Other seborrheic keratosis: Secondary | ICD-10-CM | POA: Diagnosis not present

## 2015-05-15 DIAGNOSIS — L923 Foreign body granuloma of the skin and subcutaneous tissue: Secondary | ICD-10-CM | POA: Diagnosis not present

## 2015-07-16 ENCOUNTER — Encounter: Payer: Self-pay | Admitting: Nurse Practitioner

## 2015-07-17 DIAGNOSIS — Z0100 Encounter for examination of eyes and vision without abnormal findings: Secondary | ICD-10-CM | POA: Diagnosis not present

## 2015-07-17 DIAGNOSIS — H40012 Open angle with borderline findings, low risk, left eye: Secondary | ICD-10-CM | POA: Diagnosis not present

## 2015-07-17 DIAGNOSIS — H40011 Open angle with borderline findings, low risk, right eye: Secondary | ICD-10-CM | POA: Diagnosis not present

## 2015-07-20 ENCOUNTER — Emergency Department (HOSPITAL_COMMUNITY): Payer: PPO

## 2015-07-20 ENCOUNTER — Telehealth: Payer: Self-pay | Admitting: Internal Medicine

## 2015-07-20 ENCOUNTER — Encounter (HOSPITAL_COMMUNITY): Payer: Self-pay | Admitting: Family Medicine

## 2015-07-20 ENCOUNTER — Observation Stay (HOSPITAL_COMMUNITY)
Admission: EM | Admit: 2015-07-20 | Discharge: 2015-07-21 | Disposition: A | Discharge status: Home / Self Care | Payer: PPO | Attending: Cardiology | Admitting: Cardiology

## 2015-07-20 DIAGNOSIS — R5383 Other fatigue: Secondary | ICD-10-CM | POA: Diagnosis not present

## 2015-07-20 DIAGNOSIS — Z8673 Personal history of transient ischemic attack (TIA), and cerebral infarction without residual deficits: Secondary | ICD-10-CM | POA: Insufficient documentation

## 2015-07-20 DIAGNOSIS — I959 Hypotension, unspecified: Secondary | ICD-10-CM | POA: Diagnosis not present

## 2015-07-20 DIAGNOSIS — E785 Hyperlipidemia, unspecified: Secondary | ICD-10-CM | POA: Insufficient documentation

## 2015-07-20 DIAGNOSIS — Z96642 Presence of left artificial hip joint: Secondary | ICD-10-CM | POA: Diagnosis not present

## 2015-07-20 DIAGNOSIS — Z85828 Personal history of other malignant neoplasm of skin: Secondary | ICD-10-CM | POA: Diagnosis not present

## 2015-07-20 DIAGNOSIS — I422 Other hypertrophic cardiomyopathy: Secondary | ICD-10-CM | POA: Diagnosis not present

## 2015-07-20 DIAGNOSIS — I440 Atrioventricular block, first degree: Secondary | ICD-10-CM | POA: Diagnosis not present

## 2015-07-20 DIAGNOSIS — Z87891 Personal history of nicotine dependence: Secondary | ICD-10-CM | POA: Insufficient documentation

## 2015-07-20 DIAGNOSIS — I452 Bifascicular block: Secondary | ICD-10-CM | POA: Diagnosis not present

## 2015-07-20 DIAGNOSIS — I471 Supraventricular tachycardia: Secondary | ICD-10-CM | POA: Diagnosis not present

## 2015-07-20 DIAGNOSIS — N179 Acute kidney failure, unspecified: Secondary | ICD-10-CM | POA: Insufficient documentation

## 2015-07-20 DIAGNOSIS — R0602 Shortness of breath: Secondary | ICD-10-CM | POA: Diagnosis not present

## 2015-07-20 DIAGNOSIS — Z880 Allergy status to penicillin: Secondary | ICD-10-CM | POA: Insufficient documentation

## 2015-07-20 DIAGNOSIS — I10 Essential (primary) hypertension: Secondary | ICD-10-CM | POA: Insufficient documentation

## 2015-07-20 DIAGNOSIS — R42 Dizziness and giddiness: Secondary | ICD-10-CM | POA: Diagnosis not present

## 2015-07-20 DIAGNOSIS — Z7901 Long term (current) use of anticoagulants: Secondary | ICD-10-CM | POA: Diagnosis not present

## 2015-07-20 DIAGNOSIS — N400 Enlarged prostate without lower urinary tract symptoms: Secondary | ICD-10-CM | POA: Insufficient documentation

## 2015-07-20 DIAGNOSIS — E860 Dehydration: Secondary | ICD-10-CM | POA: Insufficient documentation

## 2015-07-20 DIAGNOSIS — I4892 Unspecified atrial flutter: Secondary | ICD-10-CM | POA: Diagnosis present

## 2015-07-20 DIAGNOSIS — I951 Orthostatic hypotension: Secondary | ICD-10-CM | POA: Diagnosis not present

## 2015-07-20 DIAGNOSIS — E782 Mixed hyperlipidemia: Secondary | ICD-10-CM | POA: Diagnosis present

## 2015-07-20 DIAGNOSIS — R001 Bradycardia, unspecified: Secondary | ICD-10-CM | POA: Insufficient documentation

## 2015-07-20 HISTORY — DX: Supraventricular tachycardia: I47.1

## 2015-07-20 HISTORY — DX: Other supraventricular tachycardia: I47.19

## 2015-07-20 LAB — CBC
HCT: 44 % (ref 39.0–52.0)
Hemoglobin: 14.4 g/dL (ref 13.0–17.0)
MCH: 29.4 pg (ref 26.0–34.0)
MCHC: 32.7 g/dL (ref 30.0–36.0)
MCV: 90 fL (ref 78.0–100.0)
Platelets: 208 10*3/uL (ref 150–400)
RBC: 4.89 MIL/uL (ref 4.22–5.81)
RDW: 12.6 % (ref 11.5–15.5)
WBC: 8.6 10*3/uL (ref 4.0–10.5)

## 2015-07-20 LAB — BASIC METABOLIC PANEL
Anion gap: 10 (ref 5–15)
BUN: 25 mg/dL — ABNORMAL HIGH (ref 6–20)
CO2: 23 mmol/L (ref 22–32)
Calcium: 9.9 mg/dL (ref 8.9–10.3)
Chloride: 106 mmol/L (ref 101–111)
Creatinine, Ser: 1.54 mg/dL — ABNORMAL HIGH (ref 0.61–1.24)
GFR calc Af Amer: 49 mL/min — ABNORMAL LOW (ref >60)
GFR calc non Af Amer: 42 mL/min — ABNORMAL LOW (ref >60)
Glucose, Bld: 100 mg/dL — ABNORMAL HIGH (ref 65–99)
Potassium: 4.9 mmol/L (ref 3.5–5.1)
Sodium: 139 mmol/L (ref 135–145)

## 2015-07-20 LAB — I-STAT TROPONIN, ED: Troponin i, poc: 0.03 ng/mL (ref 0.00–0.08)

## 2015-07-20 MED ORDER — ONDANSETRON HCL 4 MG/2ML IJ SOLN: 4.0000 mg | Freq: Four times a day (QID) | INTRAMUSCULAR | Status: DC | PRN

## 2015-07-20 MED ORDER — SODIUM CHLORIDE 0.9 % IV SOLN
INTRAVENOUS | Status: DC
  Administered 2015-07-21: via INTRAVENOUS

## 2015-07-20 MED ORDER — SIMVASTATIN 40 MG PO TABS
40.0000 mg | ORAL_TABLET | Freq: Every day | ORAL | Status: DC
  Filled 2015-07-20: qty 1

## 2015-07-20 MED ORDER — APIXABAN 5 MG PO TABS
5.0000 mg | ORAL_TABLET | Freq: Two times a day (BID) | ORAL | Status: DC
  Administered 2015-07-21 (×2): 5 mg via ORAL
  Filled 2015-07-20 (×2): qty 1

## 2015-07-20 MED ORDER — SODIUM CHLORIDE 0.9 % IV BOLUS (SEPSIS)
1000.0000 mL | Freq: Once | INTRAVENOUS | Status: AC
  Administered 2015-07-20: 1000 mL via INTRAVENOUS

## 2015-07-20 MED ORDER — NITROGLYCERIN 0.4 MG SL SUBL: 0.4000 mg | SUBLINGUAL_TABLET | SUBLINGUAL | Status: DC | PRN

## 2015-07-20 MED ORDER — ACETAMINOPHEN 325 MG PO TABS: 650.0000 mg | ORAL_TABLET | ORAL | Status: DC | PRN

## 2015-07-20 NOTE — ED Provider Notes (Signed)
CSN: [id number]     Arrival date & time 07/20/15  1402 History   First MD Initiated Contact with Patient 07/20/15 1605     Chief Complaint  Patient presents with  . Shortness of Breath  . Fatigue     (Consider location/radiation/quality/duration/timing/severity/associated sxs/prior Treatment) HPI Comments: Patient presents emergency department with chief complaint of fatigue. He states that he was golfing 3 days ago, and began to feel lightheaded and fatigued. He states that he went home and rested. States that yesterday he was working in the yard and began to feel lightheaded and fatigued again. States that he checked his blood pressure and it was in the 80s over 50s. This is unusual for him. He does have a history of atrial flutter, and has had ablations performed in the past by Dr. Rayann Heman of cardiology. Patient also states that his heart rate has been faster than normal. States that he normally runs in the 50s to 60s. Patient denies any chest pain. Denies shortness of breath, stating that he only felt fatigued/lightheaded. He states that he has felt similar to this in the past when he was dehydrated several years ago. He states that he did have a syncopal episode a couple of years ago after riding his bicycle. He states that he is normally very active and he denies any medication changes.  The history is provided by the patient. No language interpreter was used.    Past Medical History  Diagnosis Date  . Hypertrophic cardiomyopathy ([place])   . Diverticulosis   . Hemorrhoids   . BPH (benign prostatic hyperplasia)   . TIA (transient ischemic attack) ~ 2011    denies residual (12/11/2011)  . Hypertension   . Hyperlipidemia   . Basal cell carcinoma of cheek ~ 1973    left  . Atrial flutter [facility])     s/p CTI ablation by Dr Rayann Heman 10/12, 9/13   Past Surgical History  Procedure Laterality Date  . Basal cell carcinoma excision  ~ 1973    left cheek  . Total hip arthroplasty      left  .  Atrial ablation surgery  10/12    CTI ablation for atrial flutter by Dr Rayann Heman  . Cardiac catheterization  1960's  . Tonsillectomy and adenoidectomy  1947  . Joint replacement    . Hernia repair  ~ [id number]    umbilical  . Atrial flutter ablation N/A 12/11/2011    CTI ablation for atrial flutter by Dr Rayann Heman   Family History  Problem Relation Age of Onset  . Colon polyps Brother   . Tuberculosis Brother     and father  . Breast cancer Mother    Social History  Substance Use Topics  . Smoking status: Former Smoker -- 2.00 packs/day for 10 years    Types: Cigarettes    Quit date: 03/17/1970  . Smokeless tobacco: Never Used  . Alcohol Use: Yes     Comment: quit 1995    Review of Systems  Constitutional: Positive for fatigue. Negative for fever and chills.  Respiratory: Negative for shortness of breath.   Cardiovascular: Negative for chest pain.  Gastrointestinal: Negative for nausea, vomiting, diarrhea and constipation.  Genitourinary: Negative for dysuria.  Neurological: Positive for light-headedness.  All other systems reviewed and are negative.     Allergies  Penicillins  Home Medications   Prior to Admission medications   Medication Sig Start Date End Date Taking? Authorizing Provider  apixaban (ELIQUIS) 5 MG TABS tablet Take  1 tablet (5 mg total) by mouth 2 (two) times daily. 02/21/15   Thompson Grayer, MD  doxylamine, Sleep, (UNISOM) 25 MG tablet Take 25 mg by mouth at bedtime as needed for sleep.     Historical Provider, MD  lisinopril (PRINIVIL,ZESTRIL) 10 MG tablet Take 1 tablet (10 mg total) by mouth daily. 02/20/15   Susy Frizzle, MD  simvastatin (ZOCOR) 40 MG tablet Take 1 tablet (40 mg total) by mouth at bedtime. 03/27/14   Thompson Grayer, MD  simvastatin (ZOCOR) 40 MG tablet TAKE 1 TABLET (40 MG TOTAL) BY MOUTH AT BEDTIME. 03/01/15   Thompson Grayer, MD   BP 138/97 mmHg  Pulse 76  Temp(Src) 97.4 F (36.3 C)  SpO2 95% Physical Exam  Constitutional: He is  oriented to person, place, and time. He appears well-developed and well-nourished.  HENT:  Head: Normocephalic and atraumatic.  Eyes: Conjunctivae and EOM are normal. Pupils are equal, round, and reactive to light. Right eye exhibits no discharge. Left eye exhibits no discharge. No scleral icterus.  Neck: Normal range of motion. Neck supple. No JVD present.  Cardiovascular: Normal rate, regular rhythm and normal heart sounds.  Exam reveals no gallop and no friction rub.   No murmur heard. Pulmonary/Chest: Effort normal and breath sounds normal. No respiratory distress. He has no wheezes. He has no rales. He exhibits no tenderness.  Abdominal: Soft. He exhibits no distension and no mass. There is no tenderness. There is no rebound and no guarding.  Musculoskeletal: Normal range of motion. He exhibits no edema or tenderness.  Neurological: He is alert and oriented to person, place, and time.  Skin: Skin is warm and dry.  Psychiatric: He has a normal mood and affect. His behavior is normal. Judgment and thought content normal.  Nursing note and vitals reviewed.   ED Course  Procedures (including critical care time) Labs Review Labs Reviewed  BASIC METABOLIC PANEL - Abnormal; Notable for the following:    Glucose, Bld 100 (*)    BUN 25 (*)    Creatinine, Ser 1.54 (*)    GFR calc non Af Amer 42 (*)    GFR calc Af Amer 49 (*)    All other components within normal limits  CBC  I-STAT TROPOININ, ED    Imaging Review Dg Chest 2 View  07/20/2015  CLINICAL DATA:  Weakness and lightheadedness with exertion. Shortness of breath. EXAM: CHEST - 2 VIEW COMPARISON:  Two-view chest x-ray 12/02/2013. FINDINGS: The heart size and mediastinal contours are within normal limits. Both lungs are clear. The visualized skeletal structures are unremarkable. IMPRESSION: Negative two view chest x-ray. Electronically Signed   By: San Morelle M.D.   On: 07/20/2015 15:21   I have personally reviewed and  evaluated these images and lab results as part of my medical decision-making.   EKG Interpretation   Date/Time:  Friday Jul 20 2015 14:09:24 EDT Ventricular Rate:  83 PR Interval:    QRS Duration: 134 QT Interval:  408 QTC Calculation: 479 R Axis:   -81 Text Interpretation:  Sinus with pacs and short run of PAT Left axis  deviation Right bundle branch block Inferior infarct , age undetermined  Anterior infarct , age undetermined Abnormal ECG Ectopy New since previous  tracing Confirmed by CRENSHAW  MD, [place] ([id number]) on 07/20/2015 3:23:12 PM      MDM   Final diagnoses:  Other fatigue    Patient with fatigue and lightheadedness 3 days. States that his blood pressure has  been low, in the 80s over 50s. He also reports that his heart rate has been fast. He denies any medication changes. Denies any chest pain or shortness of breath. States he feels well now. States that he has felt similar in the past when he has been dehydrated. Will check EKG, place on cardiac monitor, and give fluids. Will check orthostatic vital signs. Laboratory workup is remarkable for increased creatinine to 1.54, up from 1.03 in October of last year. Troponin is negative. H&H is normal.  CXR is negative.  EKG is consistent with ectopic atrial tachycardia, this was reviewed with Dr. Stanford Breed of cardiology.  Appreciate Dr. Stanford Breed for coming to consult on the patient in the emergency department.  Patient will be admitted for observation.     Montine Circle, PA-C 07/20/15 1807  Tanna Furry, MD 07/23/15 [id number]

## 2015-07-20 NOTE — ED Notes (Signed)
Pt here for SOB, fatigue. sts that he was on the golf course yesterday and didn't feel well and any activity makes him fatigued, light headed and SOB. sts BP was 80s/50s. sts HR has been irregular. Denies chest pain

## 2015-07-20 NOTE — Telephone Encounter (Signed)
Spoke with pt, today he is lightheaded, fatigue and SOB with mild exertion. He reports his bo 80-75/60 with pulse fluctuation from 50-80. He thinks he maybe out of rhythm again. He reports this started a couple days ago, he increased his fluids and felt better. He took his lisinopril at bedtime last night. His bp prior to hanging up was 135/80 with pulse of 82 by his report. The pt feels something is going on and would like to be seen today, pt made aware his bp and pulse are back to normal. Pt feels he need evaluation and was referred to [facility]. cardmaster made aware.

## 2015-07-20 NOTE — ED Notes (Signed)
Attempted report 

## 2015-07-20 NOTE — Telephone Encounter (Signed)
Patient c/o Palpitations:  High priority if patient c/o lightheadedness and shortness of breath.  1. How long have you been having palpitations? 3 days ago  2. Are you currently experiencing lightheadedness and shortness of breath? lightheadedness and shortness of breath  3. Have you checked your BP and heart rate? (document readings) 80  To 90 4. Are you experiencing any other symptoms? no

## 2015-07-20 NOTE — Consult Note (Signed)
CARDIOLOGY CONSULT NOTE   Patient ID: [name] MRN: [id number] DOB/AGE: 18-Mar-1940 75 y.o.  Admit date: 07/20/2015  Primary Physician   Anthony Fraction, MD Primary Cardiologist: Dr. Rayann [name] Requesting MD: Dr.  Luiz [name] for Consultation: Aflutter  HPI: [name] is a 75 year old male with a past medical history of hypertrophic cardiomyopathy, BPH, hypertension, hyperlipidemia, and atrial flutter. He has had 2 ablations in the past for recurrent atrial flutter, in October 2012 and again in September 2013. He is on Eliquis for anticoagulation. He also has a history of previous stroke and TIA in September 2015.   His last echo was in December 2016. His LVEF was 50-55%. There was hypokinesis of the anteroseptal myocardium and hypokinesis of the inferior septal myocardium. He had increased filling pressures with grade 2 diastolic dysfunction.  He was last seen by Dr. Rayann [name] in December 2016. EKG at that office visit revealed sinus bradycardia at 54 bpm with first-degree block.  Patient presented to ED today with complaints of shortness of breath and fatigue. He played golf 2 days ago and didn't feel well, couldn't finish the round of golf without stopping and feeling fatigued. He took his BP and it was 70/50. The next day he was gardening and felt the same way, light headed and took his BP, SBP was again in the 70's.  Today he was working outside, cleaning his pool. He again had the low BP and fatigue.  His blood pressure monitor does tell him his HR and all reading were in the 70's-80's.  He denies chest pain. He does feel slightly SOB with activity. No nausea or diaphoresis.   He is very active. He rides his bike a few times per week and goes 20+ miles without any symptoms up until 3 days ago.  He takes lisinopril and was started on that in December. He denies orthostatic symptoms. In the past when he is in Afib he doesn't feel palpitations.     Past Medical History  Diagnosis Date    . Hypertrophic cardiomyopathy ([place])   . Diverticulosis   . Hemorrhoids   . BPH (benign prostatic hyperplasia)   . TIA (transient ischemic attack) ~ 2011    denies residual (12/11/2011)  . Hypertension   . Hyperlipidemia   . Basal cell carcinoma of cheek ~ 1973    left  . Atrial flutter [facility])     s/p CTI ablation by Dr [name] 10/12, 9/13     Past Surgical History  Procedure Laterality Date  . Basal cell carcinoma excision  ~ 1973    left cheek  . Total hip arthroplasty      left  . Atrial ablation surgery  10/12    CTI ablation for atrial flutter by Dr [name]  . Cardiac catheterization  1960's  . Tonsillectomy and adenoidectomy  1947  . Joint replacement    . Hernia repair  ~ [id number]    umbilical  . Atrial flutter ablation N/A 12/11/2011    CTI ablation for atrial flutter by Dr [name]    Allergies  Allergen Reactions  . Penicillins Other (See Comments)    RXN: unknown; "told I might have allergy 1960's"    I have reviewed the patient's current medications     Prior to Admission medications   Medication Sig Start Date End Date Taking? Authorizing Provider  apixaban (ELIQUIS) 5 MG TABS tablet Take 1 tablet (5 mg total) by mouth 2 (two) times daily. 02/21/15  Anthony Grayer, MD  doxylamine, Sleep, (UNISOM) 25 MG tablet Take 25 mg by mouth at bedtime as needed for sleep.     Historical Provider, MD  lisinopril (PRINIVIL,ZESTRIL) 10 MG tablet Take 1 tablet (10 mg total) by mouth daily. 02/20/15   Anthony Frizzle, MD  simvastatin (ZOCOR) 40 MG tablet Take 1 tablet (40 mg total) by mouth at bedtime. 03/27/14   Anthony Grayer, MD  simvastatin (ZOCOR) 40 MG tablet TAKE 1 TABLET (40 MG TOTAL) BY MOUTH AT BEDTIME. 03/01/15   Anthony Grayer, MD     Social History   Social History  . Marital Status: Married    Spouse Name: N/A  . Number of Children: 2  . Years of Education: N/A   Occupational History  . retired    Social History Main Topics  . Smoking status: Former Smoker --  2.00 packs/day for 10 years    Types: Cigarettes    Quit date: 03/17/1970  . Smokeless tobacco: Never Used  . Alcohol Use: Yes     Comment: quit 1995  . Drug Use: No  . Sexual Activity: Yes   Other Topics Concern  . Not on file   Social History Narrative   Lives in [place] with spouse.  2 Grown children.  Retired from Data processing manager    Family Status  Relation Status Death Age  . Mother Deceased   . Father Deceased    Family History  Problem Relation Age of Onset  . Colon polyps Brother   . Tuberculosis Brother     and father  . Breast cancer Mother      ROS:  Full 14 point review of systems complete and found to be negative unless listed above.  Physical Exam: Blood pressure 138/97, pulse 68, temperature 97.4 F (36.3 C), resp. rate 11, SpO2 99 %.  General: Well developed, well nourished, male in no acute distress Head: Eyes PERRLA, No xanthomas.   Normocephalic and atraumatic, oropharynx without edema or exudate. Dentition: Good Lungs: CTA Heart: HRRR S1 S2, no rub/gallop,No murmur. pulses are 2+ extrem.   Neck: No carotid bruits. No lymphadenopathy.  No JVD. Abdomen: Bowel sounds present, abdomen soft and non-tender without masses or hernias noted. Msk:  No spine or cva tenderness. No weakness, no joint deformities or effusions. Extremities: No clubbing or cyanosis.  No edema.  Neuro: Alert and oriented X 3. No focal deficits noted. Psych:  Good affect, responds appropriately Skin: No rashes or lesions noted.  Labs:   Lab Results  Component Value Date   WBC 8.6 07/20/2015   HGB 14.4 07/20/2015   HCT 44.0 07/20/2015   MCV 90.0 07/20/2015   PLT 208 07/20/2015    Recent Labs Lab 07/20/15 1425  NA 139  K 4.9  CL 106  CO2 23  BUN 25*  CREATININE 1.54*  CALCIUM 9.9  GLUCOSE 100*    Recent Labs  07/20/15 1441  TROPIPOC 0.03     Echo: - Left ventricle: The cavity size was normal. Wall thickness was  increased in a pattern of mild  LVH. Systolic function was normal.  The estimated ejection [name] was in the range of 50% to 55%.  Hypokinesis of the anteroseptal myocardium. Hypokinesis of the  inferoseptal myocardium. Features are consistent with a  pseudonormal left ventricular filling pattern, with concomitant  abnormal relaxation and increased filling pressure (grade 2  diastolic dysfunction). - Aortic valve: There was trivial regurgitation. - Mitral valve: There was mild regurgitation. -  Left atrium: The atrium was severely dilated. - Right ventricle: The cavity size was normal. Wall thickness was  normal. Systolic function was normal. - Right atrium: The atrium was mildly dilated. - Pulmonic valve: There was mild regurgitation. - Inferior vena cava: The vessel was normal in size. The  respirophasic diameter changes were in the normal range (>= 50%),  consistent with normal central venous pressure.  ECG:  NSR with 1 degree AVB.   Radiology:  Dg Chest 2 View  07/20/2015  CLINICAL DATA:  Weakness and lightheadedness with exertion. Shortness of breath. EXAM: CHEST - 2 VIEW COMPARISON:  Two-view chest x-ray 12/02/2013. FINDINGS: The heart size and mediastinal contours are within normal limits. Both lungs are clear. The visualized skeletal structures are unremarkable. IMPRESSION: Negative two view chest x-ray. Electronically Signed   By: San Morelle M.D.   On: 07/20/2015 15:21    ASSESSMENT AND PLAN:     1. Fatigue: Patient is normally very active and has recently the past 3 days felt fatigued with activity.  His BP has been low with SBP in the 70's.  His BP is elevated today, and he does appear to have some conduction abnormalities. He is not in [place] now, EKG shows NSR with 1 degree AVB.    Note his creatinine is 1.54, this is elevated from 1.03 in December. He was started on an ACEI. We will hold and gently hydrate. We will admit for observation overnight.    Signed: Arbutus Leas,  NP 07/20/2015 5:00 PM Pager [phone]  Co-Sign MD As above, patient seen and examined  Briefly he is a 75 year old male with past medical history of atrial flutter status post ablation,  hypertrophic cardiomyopathy, hypertension and hyperlipidemia with ectopic atrial tachycardia and dizziness. Nuclear study September 2015 showed ejection [name] 53%, infarct but no ischemia. Last echocardiogram December 2016 showed ejection [name] 50-55% with hypokinesis of the anteroseptal wall. Grade 2 diastolic dysfunction and severe left atrial enlargement; mild right atrial enlargement. Over the past 3 days the patient has had episodes of fatigue and dizziness with exertion. He checked his blood pressure and his systolic was in the Q000111Q. His symptoms would last 4-5 hours. No associated palpitations chest pain, dyspnea or syncope. He presented to the emergency room for further evaluation. He denies recent decreased by mouth intake or diarrhea.  Creatinine 1.54. Electrocardiogram shows sinus rhythm with bursts of ectopic atrial tachycardia, left anterior fascicular block, right bundle branch block, prior inferior infarct and anterior infarct.  Etiology of symptoms unclear. Question component of dehydration given new elevation in creatinine.  We will gently hydrate. Electrocardiogram shows bursts of atrial tachycardia but heart rate in the 80s. He states his systolic blood pressure was in the 70s. I will hold his lisinopril. I considered adding low-dose beta blockade for his atrial arrhythmias but given baseline conduction abnormality we will not pursue this. We will observe on telemetry. Follow blood pressure and adjust regimen as needed. Recheck potassium and renal function tomorrow morning. Continue apixaban.  Kirk Ruths

## 2015-07-21 ENCOUNTER — Encounter (HOSPITAL_COMMUNITY): Payer: Self-pay | Admitting: Physician Assistant

## 2015-07-21 DIAGNOSIS — E860 Dehydration: Secondary | ICD-10-CM | POA: Diagnosis not present

## 2015-07-21 DIAGNOSIS — I471 Supraventricular tachycardia: Secondary | ICD-10-CM | POA: Diagnosis not present

## 2015-07-21 DIAGNOSIS — I951 Orthostatic hypotension: Secondary | ICD-10-CM | POA: Diagnosis not present

## 2015-07-21 DIAGNOSIS — I959 Hypotension, unspecified: Secondary | ICD-10-CM | POA: Diagnosis not present

## 2015-07-21 DIAGNOSIS — E785 Hyperlipidemia, unspecified: Secondary | ICD-10-CM | POA: Diagnosis not present

## 2015-07-21 LAB — CBC
HCT: 37.9 % — ABNORMAL LOW (ref 39.0–52.0)
Hemoglobin: 12.6 g/dL — ABNORMAL LOW (ref 13.0–17.0)
MCH: 29.9 pg (ref 26.0–34.0)
MCHC: 33.2 g/dL (ref 30.0–36.0)
MCV: 90 fL (ref 78.0–100.0)
Platelets: 186 10*3/uL (ref 150–400)
RBC: 4.21 MIL/uL — ABNORMAL LOW (ref 4.22–5.81)
RDW: 13 % (ref 11.5–15.5)
WBC: 6.6 10*3/uL (ref 4.0–10.5)

## 2015-07-21 LAB — BASIC METABOLIC PANEL
Anion gap: 8 (ref 5–15)
BUN: 24 mg/dL — ABNORMAL HIGH (ref 6–20)
CO2: 23 mmol/L (ref 22–32)
Calcium: 8.7 mg/dL — ABNORMAL LOW (ref 8.9–10.3)
Chloride: 110 mmol/L (ref 101–111)
Creatinine, Ser: 1.38 mg/dL — ABNORMAL HIGH (ref 0.61–1.24)
GFR calc Af Amer: 56 mL/min — ABNORMAL LOW (ref >60)
GFR calc non Af Amer: 48 mL/min — ABNORMAL LOW (ref >60)
Glucose, Bld: 98 mg/dL (ref 65–99)
Potassium: 4.7 mmol/L (ref 3.5–5.1)
Sodium: 141 mmol/L (ref 135–145)

## 2015-07-21 LAB — LIPID PANEL
Cholesterol: 180 mg/dL (ref 0–200)
HDL: 51 mg/dL (ref >40)
LDL Cholesterol: 115 mg/dL — ABNORMAL HIGH (ref 0–99)
Total CHOL/HDL Ratio: 3.5 RATIO
Triglycerides: 71 mg/dL (ref <150)
VLDL: 14 mg/dL (ref 0–40)

## 2015-07-21 NOTE — Progress Notes (Signed)
Patient alert and oriented, denies pain, no shortness of breath, v/s stable. Iv and tele d/c, d/c instruction explain and given to the patient, all questions answered , patient verbalized understanding. Patient d/c per order.

## 2015-07-21 NOTE — Progress Notes (Signed)
    Subjective:  Denies CP or dyspnea   Objective:  Filed Vitals:   07/20/15 2014 07/21/15 0201 07/21/15 0426 07/21/15 0429  BP: 145/73 108/62  112/66  Pulse: 64 57  57  Temp: 97.9 F (36.6 C) 97.8 F (36.6 C)  97.7 F (36.5 C)  TempSrc: Oral Oral  Oral  Resp: 18 18  18   Height: 5\' 10"  (1.778 m)     Weight: 171 lb 1.6 oz (77.61 kg)  171 lb 8 oz (77.792 kg)   SpO2: 99% 99%  94%    Intake/Output from previous day:  Intake/Output Summary (Last 24 hours) at 07/21/15 0944 Last data filed at 07/21/15 0847  Gross per 24 hour  Intake    480 ml  Output    400 ml  Net     80 ml    Physical Exam: Physical exam: Well-developed well-nourished in no acute distress.  Skin is warm and dry.  HEENT is normal.  Neck is supple.  Chest is clear to auscultation with normal expansion.  Cardiovascular exam is regular rate and rhythm.  Abdominal exam nontender or distended. No masses palpated. Extremities show no edema. neuro grossly intact    Lab Results: Basic Metabolic Panel:  Recent Labs  07/20/15 1425 07/21/15 0511  NA 139 141  K 4.9 4.7  CL 106 110  CO2 23 23  GLUCOSE 100* 98  BUN 25* 24*  CREATININE 1.54* 1.38*  CALCIUM 9.9 8.7*   CBC:  Recent Labs  07/20/15 1425 07/21/15 0511  WBC 8.6 6.6  HGB 14.4 12.6*  HCT 44.0 37.9*  MCV 90.0 90.0  PLT 208 186     Assessment/Plan:  1. Dizziness/hypotension-Much improved this morning. Presentation may have been related to dehydration. He was hydrated overnight and now feels better. Blood pressure has improved. We will continue off of lisinopril and he will monitor his blood pressure at home.  We will resume lower dose ACE inhibitor if needed. 2 atrial tachycardia (also with h/o atrial flutter ablation)-noted on electrocardiogram in the emergency room. However his rate was not elevated. He is not clearly symptomatic. I elected not to add beta-blockade given baseline conduction abnormalities including first-degree AV  block, left anterior fascicular block and right bundle branch block. No indication for further therapy if he remains asymptomatic and rate controlled.  Telemetry personally reviewed this morning and he is in sinus bradycardia with first-degree AV block. Continue apixaban given h/o TIA. 3 acute kidney disease-possibly secondary to dehydration.  Improved with IV fluids. Can recheck the calcium and renal function in 1 week after discharge. 4  History of hypertrophic cardiomyopathy Plan discharge today with transition of care appointment in one week and then follow-up with Dr. Rayann Heman. >30 min PA and physician time D2 Kirk Ruths 07/21/2015, 9:44 AM

## 2015-07-21 NOTE — Discharge Summary (Signed)
Discharge Summary    Patient ID: [name],  MRN: [id number], DOB/AGE: 05/25/1940 76 y.o.  Admit date: 07/20/2015 Discharge date: 07/21/2015  Primary Care Provider: [facility] Primary Cardiologist: Dr Rayann Heman   Discharge Diagnoses    Principal Problem:   Dehydration   Hypotension   Ectopic atrial tachycardia ([place]) Active Problems:   HLD (hyperlipidemia)   Hypertension, essential   Hypertrophic nonobstructive cardiomyopathy ([facility])   Hx-TIA (transient ischemic attack)   Hx of Atrial flutter s/p RFA     Allergies Allergies  Allergen Reactions  . Penicillins Other (See Comments)    Unknown reaction; "told I might have allergy 1960's"     History of Present Illness     [name] is a 75 year old male with a past medical history of HOCM, BPH, HTN, HLD, and atrial flutter s/p RFA x2 on Eliquis and TIA in 01/2014 who presented to [facility] ED on 07/20/15 with dizziness and hypotension at home. He was found be dehydrated with bursts of ectopic atrial tachycardia.   His last echo was in 02/2015. His LVEF was 50-55%. There was hypokinesis of the anteroseptal myocardium and hypokinesis of the inferior septal myocardium and severe LAE. He had increased filling pressures with grade 2 diastolic dysfunction.  He was last seen by Dr. Rayann Heman in December 2016. EKG at that office visit revealed sinus bradycardia at 54 bpm with first-degree block.  Patient presented to ED 07/20/15 with 3 days of episodes of fatigue and dizziness with exertion. He checked his blood pressure and his systolic was in the Q000111Q. His symptoms would last 4-5 hours. No associated palpitations chest pain, dyspnea or syncope. He presented to the emergency room for further evaluation. He denies recent decreased by mouth intake or diarrhea. Creatinine 1.54. Electrocardiogram showed sinus rhythm with bursts of ectopic atrial tachycardia, left anterior fascicular block, right bundle branch block, prior inferior infarct  and anterior infarct. He was felt to be dehydrated and admitted for IVFs and continued observation. His Lisinopril was discontinued     Hospital Course     Consultants: none  1. Dizziness/hypotension: much improved this morning. Presentation may have been related to dehydration. He was hydrated overnight and now feels better. Blood pressure has improved. We will continue off of lisinopril and he will monitor his blood pressure at home. We will resume lower dose ACE inhibitor if needed.  2. Atrial tachycardia (also with h/o atrial flutter ablation): noted on electrocardiogram in the emergency room. However his rate was not elevated. He is not clearly symptomatic. Dr. Stanford Breed elected not to add beta-blockade given baseline conduction abnormalities including first-degree AV block, LAFB and RBBB. No indication for further therapy if he remains asymptomatic and rate controlled. Telemetry reviewed this morning and he is in sinus bradycardia with first-degree AV block. Continue apixaban given h/o TIA. Continued on eliquis  3. AKI: possibly secondary to dehydration.creat 1.54--> 1.38. Improved with IV fluids. Plan to recheck the calcium and renal function in 1 week after discharge.  4. History of hypertrophic cardiomyopathy: this was not mentioned on most recent 2D ECHO.   The patient has had an uncomplicated hospital course and is recovering well. He has been seen by Dr. Stanford Breed today and deemed ready for discharge home. A staff message for all follow up appointments have been scheduled. Discharge medications are listed below.  _____________  Discharge Vitals Blood pressure 112/66, pulse 57, temperature 97.7 F (36.5 C), temperature source Oral, resp. rate 18, height 5\' 10"  (  1.778 m), weight 171 lb 8 oz (77.792 kg), SpO2 94 %.  Filed Weights   07/20/15 2014 07/21/15 0426  Weight: 171 lb 1.6 oz (77.61 kg) 171 lb 8 oz (77.792 kg)    Labs & Radiologic Studies     CBC  Recent Labs   07/20/15 1425 07/21/15 0511  WBC 8.6 6.6  HGB 14.4 12.6*  HCT 44.0 37.9*  MCV 90.0 90.0  PLT 208 [id number]   Basic Metabolic Panel  Recent Labs  07/20/15 1425 07/21/15 0511  NA 139 141  K 4.9 4.7  CL 106 110  CO2 23 23  GLUCOSE 100* 98  BUN 25* 24*  CREATININE 1.54* 1.38*  CALCIUM 9.9 8.7*   Fasting Lipid Panel  Recent Labs  07/21/15 0511  CHOL 180  HDL 51  LDLCALC 115*  TRIG 71  CHOLHDL 3.5   Thyroid Function Tests No results for input(s): TSH, T4TOTAL, T3FREE, THYROIDAB in the last 72 hours.  Invalid input(s): FREET3  Dg Chest 2 View  07/20/2015  CLINICAL DATA:  Weakness and lightheadedness with exertion. Shortness of breath. EXAM: CHEST - 2 VIEW COMPARISON:  Two-view chest x-ray 12/02/2013. FINDINGS: The heart size and mediastinal contours are within normal limits. Both lungs are clear. The visualized skeletal structures are unremarkable. IMPRESSION: Negative two view chest x-ray. Electronically Signed   By: San Morelle M.D.   On: 07/20/2015 15:21     Diagnostic Studies/Procedures    BMET  _____________    Disposition   Pt is being discharged home today in good condition.  Follow-up Plans & Appointments    Follow-up Information    Follow up with Jowanda Heeg Grayer, MD.   Specialty:  Cardiology   Why:  The office will call you to make an appoinment., If you do not hear from them, please contact them., You should be seen within 1-2 weeks by a NP or PA .   Contact information:   [address] [place] [id number] [phone]        Discharge Medications   Current Discharge Medication List    CONTINUE these medications which have NOT CHANGED   Details  apixaban (ELIQUIS) 5 MG TABS tablet Take 1 tablet (5 mg total) by mouth 2 (two) times daily. Qty: 180 tablet, Refills: 3    Diphenhydramine-APAP, sleep, (TYLENOL PM EXTRA STRENGTH PO) Take 1 tablet by mouth at bedtime as needed.    simvastatin (ZOCOR) 40 MG tablet TAKE 1 TABLET  (40 MG TOTAL) BY MOUTH AT BEDTIME. Qty: 90 tablet, Refills: 2      STOP taking these medications     lisinopril (PRINIVIL,ZESTRIL) 10 MG tablet            Outstanding Labs/Studies   BMET  Duration of Discharge Encounter   Greater than 30 minutes including physician time.  SignedAngelena Form R PA-C 07/21/2015, 10:36 AM

## 2015-07-21 NOTE — H&P (Signed)
See consult note 07-20-15. [name]

## 2015-07-25 ENCOUNTER — Telehealth: Payer: Self-pay | Admitting: Internal Medicine

## 2015-07-25 NOTE — Telephone Encounter (Signed)
TOC fu appt per Katy-appt 07-30-15 at 9:30 am with Cecille Rubin

## 2015-07-25 NOTE — Telephone Encounter (Signed)
Left message to call back  

## 2015-07-26 NOTE — Telephone Encounter (Signed)
LMTCB

## 2015-07-27 NOTE — Telephone Encounter (Signed)
Patient contacted regarding discharge from [place] on 07/21/15.  Patient understands to follow up with provider LORI on 07/30/15 at 9:30 at [facility]. Patient understands discharge instructions? yes  Patient understands medications and regiment? yes Patient understands to bring all medications to this visit? yes   PATIENT STATED HE HAD [place]

## 2015-07-30 ENCOUNTER — Encounter: Payer: PPO | Admitting: Nurse Practitioner

## 2015-09-05 ENCOUNTER — Encounter: Payer: Self-pay | Admitting: Nurse Practitioner

## 2015-09-05 ENCOUNTER — Ambulatory Visit (INDEPENDENT_AMBULATORY_CARE_PROVIDER_SITE_OTHER): Payer: PPO | Admitting: Nurse Practitioner

## 2015-09-05 VITALS — BP 110/64 | HR 64 | Ht 70.0 in

## 2015-09-05 DIAGNOSIS — I483 Typical atrial flutter: Secondary | ICD-10-CM | POA: Diagnosis not present

## 2015-09-05 DIAGNOSIS — G459 Transient cerebral ischemic attack, unspecified: Secondary | ICD-10-CM

## 2015-09-05 DIAGNOSIS — Z7901 Long term (current) use of anticoagulants: Secondary | ICD-10-CM

## 2015-09-05 DIAGNOSIS — I422 Other hypertrophic cardiomyopathy: Secondary | ICD-10-CM

## 2015-09-05 LAB — BASIC METABOLIC PANEL
BUN: 27 mg/dL — ABNORMAL HIGH (ref 7–25)
CO2: 23 mmol/L (ref 20–31)
Calcium: 9.1 mg/dL (ref 8.6–10.3)
Chloride: 106 mmol/L (ref 98–110)
Creat: 1.4 mg/dL — ABNORMAL HIGH (ref 0.70–1.18)
Glucose, Bld: 90 mg/dL (ref 65–99)
Potassium: 4.9 mmol/L (ref 3.5–5.3)
Sodium: 139 mmol/L (ref 135–146)

## 2015-09-05 NOTE — Patient Instructions (Addendum)
We will be checking the following labs today - BMET   Medication Instructions:    Continue with your current medicines.     Testing/Procedures To Be Arranged:  N/A  Follow-Up:   See Dr. Rayann Heman in 6 months    Other Special Instructions:   N/A    If you need a refill on your cardiac medications before your next appointment, please call your pharmacy.   Call the [place] office at [phone] if you have any questions, problems or concerns.

## 2015-09-05 NOTE — Progress Notes (Signed)
CARDIOLOGY OFFICE NOTE  Date:  09/05/2015    [name] Date of Birth: October 30, 1940 Medical Record [id number]  PCP:  Odette Fraction, MD  Cardiologist:  [place]    Chief Complaint  Patient presents with  . Dizziness  . Cardiomyopathy  . Irregular Heart Beat    Post hospital visit - seen for Dr. Rayann Heman    History of Present Illness: [name] is a 75 y.o. male who presents today for a what was to be a TOC visit however, he is outside the time frame.   He has a past medical history of HOCM, BPH, HTN, HLD, and atrial flutter s/p RFA x2 on Eliquis and TIA in 01/2014.   He was last seen here back in December. Echo updated and EF was 50 to 55%.  There was hypokinesis of the anteroseptal myocardium and hypokinesis of the inferior septal myocardium and severe LAE. He had increased filling pressures with grade 2 diastolic dysfunction.  Presented to the ER on 07/20/15 with dizziness and hypotension at home. He was found be dehydrated with bursts of ectopic atrial tachycardia. He was felt to be dehydrated and admitted for IVFs and continued observation. His Lisinopril was discontinued.      Comes back today. Here alone. He notes that he is back on his ACE - he stopped it for a little while and when "blood pressure was normal - he restarted it".  Did not get repeat labs.  Says he is now feeling good and has no issue. He says he has learned that he has to drink more water to prevent recurrence. No chest pain. Not short of breath. No palpitations. Back to his regular exercise routine. He is happy with how he is doing. He is asking about his last echo results.  Past Medical History  Diagnosis Date  . Hypertrophic cardiomyopathy ([place])   . Diverticulosis   . Hemorrhoids   . BPH (benign prostatic hyperplasia)   . TIA (transient ischemic attack)   . Hypertension   . Hyperlipidemia   . Basal cell carcinoma of cheek   . Atrial flutter ([place])     a. s/p CTI ablation x2 by Dr Rayann Heman  10/12, 9/13  . Ectopic atrial tachycardia ([place])     a. Dx 07/2015: rate controlled     Past Surgical History  Procedure Laterality Date  . Basal cell carcinoma excision  ~ 1973    left cheek  . Total hip arthroplasty      left  . Atrial ablation surgery  10/12    CTI ablation for atrial flutter by Dr Rayann Heman  . Cardiac catheterization  1960's  . Tonsillectomy and adenoidectomy  1947  . Joint replacement    . Hernia repair  ~ [id number]    umbilical  . Atrial flutter ablation N/A 12/11/2011    CTI ablation for atrial flutter by Dr Rayann Heman     Medications: Current Outpatient Prescriptions  Medication Sig Dispense Refill  . apixaban (ELIQUIS) 5 MG TABS tablet Take 1 tablet (5 mg total) by mouth 2 (two) times daily. 180 tablet 3  . Diphenhydramine-APAP, sleep, (TYLENOL PM EXTRA STRENGTH PO) Take 1 tablet by mouth at bedtime as needed.    Marland Kitchen lisinopril (PRINIVIL,ZESTRIL) 10 MG tablet Take 10 mg by mouth daily.    . simvastatin (ZOCOR) 40 MG tablet TAKE 1 TABLET (40 MG TOTAL) BY MOUTH AT BEDTIME. 90 tablet 2   No current facility-administered medications for this visit.  Allergies: Allergies  Allergen Reactions  . Penicillins Other (See Comments)    Unknown reaction; "told I might have allergy 1960's"    Social History: The patient  reports that he quit smoking about 45 years ago. His smoking use included Cigarettes. He has a 20 pack-year smoking history. He has never used smokeless tobacco. He reports that he drinks alcohol. He reports that he does not use illicit drugs.   Family History: The patient's family history includes Breast cancer in his mother; Colon polyps in his brother; Tuberculosis in his brother.   Review of Systems: Please see the history of present illness.   Otherwise, the review of systems is positive for none.   All other systems are reviewed and negative.   Physical Exam: VS:  BP 110/64 mmHg  Pulse 64  Ht 5\' 10"  (1.778 m)  SpO2 98% .  BMI There is no  weight on file to calculate BMI.  Wt Readings from Last 3 Encounters:  07/21/15 171 lb 8 oz (77.792 kg)  02/21/15 173 lb 12.8 oz (78.835 kg)  01/12/15 172 lb (78.019 kg)    General: Pleasant. Well developed, well nourished and in no acute distress. Very fit.  HEENT: Normal. Neck: Supple, no JVD, carotid bruits, or masses noted.  Cardiac: Regular rate and rhythm. No murmurs, rubs, or gallops. No edema.  Respiratory:  Lungs are clear to auscultation bilaterally with normal work of breathing.  GI: Soft and nontender.  MS: No deformity or atrophy. Gait and ROM intact. Skin: Warm and dry. Color is normal.  Neuro:  Strength and sensation are intact and no gross focal deficits noted.  Psych: Alert, appropriate and with normal affect.   LABORATORY DATA:  EKG:  EKG is not ordered today.  Lab Results  Component Value Date   WBC 6.6 07/21/2015   HGB 12.6* 07/21/2015   HCT 37.9* 07/21/2015   PLT 186 07/21/2015   GLUCOSE 98 07/21/2015   CHOL 180 07/21/2015   TRIG 71 07/21/2015   HDL 51 07/21/2015   LDLCALC 115* 07/21/2015   ALT 15 01/05/2015   AST 22 01/05/2015   NA 141 07/21/2015   K 4.7 07/21/2015   CL 110 07/21/2015   CREATININE 1.38* 07/21/2015   BUN 24* 07/21/2015   CO2 23 07/21/2015   TSH 2.585 01/05/2015   PSA 3.02 01/05/2015   INR 1.09 12/02/2013   HGBA1C 6.1* 12/03/2013    BNP (last 3 results) No results for input(s): BNP in the last 8760 hours.  ProBNP (last 3 results) No results for input(s): PROBNP in the last 8760 hours.   Other Studies Reviewed Today:  Echo Study Conclusions from 02/2015  - Left ventricle: The cavity size was normal. Wall thickness was  increased in a pattern of mild LVH. Systolic function was normal.  The estimated ejection fraction was in the range of 50% to 55%.  Hypokinesis of the anteroseptal myocardium. Hypokinesis of the  inferoseptal myocardium. Features are consistent with a  pseudonormal left ventricular filling  pattern, with concomitant  abnormal relaxation and increased filling pressure (grade 2  diastolic dysfunction). - Aortic valve: There was trivial regurgitation. - Mitral valve: There was mild regurgitation. - Left atrium: The atrium was severely dilated. - Right ventricle: The cavity size was normal. Wall thickness was  normal. Systolic function was normal. - Right atrium: The atrium was mildly dilated. - Pulmonic valve: There was mild regurgitation. - Inferior vena cava: The vessel was normal in size. The  respirophasic diameter  changes were in the normal range (>= 50%),  consistent with normal central venous pressure.  Assessment/Plan: 1. Dizziness/hypotension: it was felt that his visit to the ER last month was due to dehydration - now more vigilant about his hydration. Recheck BMET today.   2. Atrial tachycardia (also with h/o atrial flutter ablation): noted on electrocardiogram in the emergency room. However his rate was not elevated. He was not  symptomatic. Dr. Stanford Breed elected not to add beta-blockade given baseline conduction abnormalities including first-degree AV block, LAFB and RBBB. No indication for further therapy if he remains asymptomatic and rate controlled.  Continue apixaban given h/o TIA.   3. AKI: possibly secondary to dehydration.He did not get his labs rechecked after this last discharge - BMET today  4. History of hypertrophic cardiomyopathy: this was not mentioned on most recent 2D ECHO. EF has improved. Does have diastolic dysfunction. Very well compensated at this time.   5. Remote TIA - remains on anticoagulation.   Current medicines are reviewed with the patient today.  The patient does not have concerns regarding medicines other than what has been noted above.  The following changes have been made:  See above.  Labs/ tests ordered today include:    Orders Placed This Encounter  Procedures  . Basic metabolic panel     Disposition:   FU with  Dr. Rayann Heman as planned in December.   Patient is agreeable to this plan and will call if any problems develop in the interim.   Signed: Burtis Junes, RN, ANP-C 09/05/2015 3:51 PM  [facility] [address] [place], [place]  [address] Phone: [phone] Fax: [phone]

## 2015-09-10 ENCOUNTER — Telehealth: Payer: Self-pay | Admitting: Family Medicine

## 2015-09-10 MED ORDER — LISINOPRIL 10 MG PO TABS: 10.0000 mg | ORAL_TABLET | Freq: Every day | ORAL | Status: DC

## 2015-09-10 NOTE — Telephone Encounter (Signed)
Call Pt I recommend he see a travel clinic in [place] to make sure he has all the recommended immunizations for [place], this would include Typhoid, japanese encephalitis, Hepatitis A and B, Rabies, MMR  Also he needs travelers diarrhea medication to have in case- Cipro 555m BID for 3 days [place]- Mefloquin 2552mq weekly, starting 2 weeks before he leaves, [place]  -- I highly recommend he go to travelors clinic they will supply him with prescriptions and everything he needs

## 2015-09-10 NOTE — Telephone Encounter (Signed)
Patient stopped by office requesting refill on his lisinopril (PRINIVIL,ZESTRIL) 10 MG tablet he is also requesting a prescription for malaria protection he is going on a trip July 20-October 19, 2015 to [place]. He uses [facility] on [address]. He is aware that Dr. Dennard Schaumann is out of the office this week. He does need his lisinopril called in before next week.  CB#[phone]

## 2015-09-10 NOTE — Telephone Encounter (Signed)
Lisinopril refilled  - can you advise on the malaria medication?

## 2015-09-13 NOTE — Telephone Encounter (Signed)
LMTRC

## 2015-09-14 MED ORDER — MEFLOQUINE HCL 250 MG PO TABS: ORAL_TABLET | ORAL | Status: DC

## 2015-09-14 MED ORDER — CIPROFLOXACIN HCL 500 MG PO TABS: 500.0000 mg | ORAL_TABLET | Freq: Two times a day (BID) | ORAL | Status: DC

## 2015-09-14 NOTE — Telephone Encounter (Signed)
Patient aware of providers recommendations. He states that he has been several times and does not require those injections. Med called to pharm

## 2015-10-12 ENCOUNTER — Telehealth: Payer: Self-pay | Admitting: Family Medicine

## 2015-10-12 MED ORDER — LISINOPRIL 10 MG PO TABS: 10.0000 mg | ORAL_TABLET | Freq: Every day | ORAL | 3 refills | Status: DC

## 2015-10-12 NOTE — Telephone Encounter (Signed)
Medication called/sent to requested pharmacy  

## 2015-10-12 NOTE — Telephone Encounter (Signed)
Patient would like to start getting 90 day supply of his lisinopril (PRINIVIL,ZESTRIL) 10 MG tablet [id number]   Called into [facility]   [phone]

## 2015-10-23 DIAGNOSIS — L570 Actinic keratosis: Secondary | ICD-10-CM | POA: Diagnosis not present

## 2015-10-23 DIAGNOSIS — L821 Other seborrheic keratosis: Secondary | ICD-10-CM | POA: Diagnosis not present

## 2015-10-23 DIAGNOSIS — D1801 Hemangioma of skin and subcutaneous tissue: Secondary | ICD-10-CM | POA: Diagnosis not present

## 2015-10-23 DIAGNOSIS — D235 Other benign neoplasm of skin of trunk: Secondary | ICD-10-CM | POA: Diagnosis not present

## 2015-10-23 DIAGNOSIS — Z85820 Personal history of malignant melanoma of skin: Secondary | ICD-10-CM | POA: Diagnosis not present

## 2015-10-29 DIAGNOSIS — H401122 Primary open-angle glaucoma, left eye, moderate stage: Secondary | ICD-10-CM | POA: Diagnosis not present

## 2015-10-29 DIAGNOSIS — H401111 Primary open-angle glaucoma, right eye, mild stage: Secondary | ICD-10-CM | POA: Diagnosis not present

## 2015-11-01 ENCOUNTER — Other Ambulatory Visit: Payer: PPO

## 2015-11-01 ENCOUNTER — Other Ambulatory Visit: Payer: Self-pay | Admitting: Family Medicine

## 2015-11-01 DIAGNOSIS — Z0000 Encounter for general adult medical examination without abnormal findings: Secondary | ICD-10-CM

## 2015-11-01 DIAGNOSIS — N400 Enlarged prostate without lower urinary tract symptoms: Secondary | ICD-10-CM

## 2015-11-01 DIAGNOSIS — I10 Essential (primary) hypertension: Secondary | ICD-10-CM

## 2015-11-01 DIAGNOSIS — Z79899 Other long term (current) drug therapy: Secondary | ICD-10-CM

## 2015-11-01 DIAGNOSIS — I421 Obstructive hypertrophic cardiomyopathy: Secondary | ICD-10-CM | POA: Diagnosis not present

## 2015-11-01 DIAGNOSIS — E785 Hyperlipidemia, unspecified: Secondary | ICD-10-CM | POA: Diagnosis not present

## 2015-11-01 LAB — CBC WITH DIFFERENTIAL/PLATELET
Basophils Absolute: 0 cells/uL (ref 0–200)
Basophils Relative: 0 %
Eosinophils Absolute: 146 cells/uL (ref 15–500)
Eosinophils Relative: 2 %
HCT: 42.1 % (ref 38.5–50.0)
Hemoglobin: 13.6 g/dL (ref 13.0–17.0)
Lymphocytes Relative: 33 %
Lymphs Abs: 2409 cells/uL (ref 850–3900)
MCH: 29.4 pg (ref 27.0–33.0)
MCHC: 32.3 g/dL (ref 32.0–36.0)
MCV: 90.9 fL (ref 80.0–100.0)
MPV: 10.3 fL (ref 7.5–12.5)
Monocytes Absolute: 803 cells/uL (ref 200–950)
Monocytes Relative: 11 %
Neutro Abs: 3942 cells/uL (ref 1500–7800)
Neutrophils Relative %: 54 %
Platelets: 200 10*3/uL (ref 140–400)
RBC: 4.63 MIL/uL (ref 4.20–5.80)
RDW: 13.9 % (ref 11.0–15.0)
WBC: 7.3 10*3/uL (ref 3.8–10.8)

## 2015-11-01 LAB — COMPLETE METABOLIC PANEL WITH GFR
ALT: 16 U/L (ref 9–46)
AST: 21 U/L (ref 10–35)
Albumin: 3.8 g/dL (ref 3.6–5.1)
Alkaline Phosphatase: 22 U/L — ABNORMAL LOW (ref 40–115)
BUN: 23 mg/dL (ref 7–25)
CO2: 23 mmol/L (ref 20–31)
Calcium: 8.8 mg/dL (ref 8.6–10.3)
Chloride: 105 mmol/L (ref 98–110)
Creat: 1.41 mg/dL — ABNORMAL HIGH (ref 0.70–1.18)
GFR, Est African American: 56 mL/min — ABNORMAL LOW (ref >=60)
GFR, Est Non African American: 48 mL/min — ABNORMAL LOW (ref >=60)
Glucose, Bld: 86 mg/dL (ref 70–99)
Potassium: 4.5 mmol/L (ref 3.5–5.3)
Sodium: 138 mmol/L (ref 135–146)
Total Bilirubin: 0.6 mg/dL (ref 0.2–1.2)
Total Protein: 6 g/dL — ABNORMAL LOW (ref 6.1–8.1)

## 2015-11-01 LAB — LIPID PANEL
Cholesterol: 173 mg/dL (ref 125–200)
HDL: 65 mg/dL (ref >=40)
LDL Cholesterol: 87 mg/dL (ref <130)
Total CHOL/HDL Ratio: 2.7 Ratio (ref <=5.0)
Triglycerides: 103 mg/dL (ref <150)
VLDL: 21 mg/dL (ref <30)

## 2015-11-01 LAB — PSA: PSA: 2.6 ng/mL (ref <=4.0)

## 2015-11-01 LAB — TSH: TSH: 2.65 mIU/L (ref 0.40–4.50)

## 2015-11-05 ENCOUNTER — Ambulatory Visit (INDEPENDENT_AMBULATORY_CARE_PROVIDER_SITE_OTHER): Payer: PPO | Admitting: Family Medicine

## 2015-11-05 ENCOUNTER — Encounter: Payer: Self-pay | Admitting: Family Medicine

## 2015-11-05 VITALS — BP 130/74 | HR 66 | Temp 97.5°F | Resp 16 | Ht 70.0 in | Wt 177.0 lb

## 2015-11-05 DIAGNOSIS — Z0000 Encounter for general adult medical examination without abnormal findings: Secondary | ICD-10-CM

## 2015-11-05 NOTE — Progress Notes (Signed)
Subjective:    Patient ID: [name], male    DOB: Aug 02, 1940, 75 y.o.   MRN: [id number]  HPI  Subjective:   Patient presents for Medicare Annual/Subsequent preventive examination. Patient has no medical concerns. His most recent lab work as listed below. Is significant for a mild elevation in his creatinine to 1.41. Otherwise his lab work is excellent: Appointment on 11/01/2015  Component Date Value Ref Range Status  . Sodium 11/01/2015 138  135 - 146 mmol/L Final  . Potassium 11/01/2015 4.5  3.5 - 5.3 mmol/L Final  . Chloride 11/01/2015 105  98 - 110 mmol/L Final  . CO2 11/01/2015 23  20 - 31 mmol/L Final  . Glucose, Bld 11/01/2015 86  70 - 99 mg/dL Final  . BUN 11/01/2015 23  7 - 25 mg/dL Final  . Creat 11/01/2015 1.41* 0.70 - 1.18 mg/dL Final   Comment:   For patients > or = 75 years of age: The upper reference limit for Creatinine is approximately 13% higher for people identified as African-American.     . Total Bilirubin 11/01/2015 0.6  0.2 - 1.2 mg/dL Final  . Alkaline Phosphatase 11/01/2015 22* 40 - 115 U/L Final  . AST 11/01/2015 21  10 - 35 U/L Final  . ALT 11/01/2015 16  9 - 46 U/L Final  . Total Protein 11/01/2015 6.0* 6.1 - 8.1 g/dL Final  . Albumin 11/01/2015 3.8  3.6 - 5.1 g/dL Final  . Calcium 11/01/2015 8.8  8.6 - 10.3 mg/dL Final  . GFR, Est African American 11/01/2015 56* >=60 mL/min Final  . GFR, Est Non African American 11/01/2015 48* >=60 mL/min Final  . TSH 11/01/2015 2.65  0.40 - 4.50 mIU/L Final  . Cholesterol 11/01/2015 173  125 - 200 mg/dL Final  . Triglycerides 11/01/2015 103  <150 mg/dL Final  . HDL 11/01/2015 65  >=40 mg/dL Final  . Total CHOL/HDL Ratio 11/01/2015 2.7  <=5.0 Ratio Final  . VLDL 11/01/2015 21  <30 mg/dL Final  . LDL Cholesterol 11/01/2015 87  <130 mg/dL Final   Comment:   Total Cholesterol/HDL Ratio:CHD Risk                        Coronary Heart Disease Risk Table                                        Men       Women        1/2 Average Risk              3.4        3.3              Average Risk              5.0        4.4           2X Average Risk              9.6        7.1           3X Average Risk             23.4       11.0 Use the calculated Patient Ratio above and the CHD Risk table  to determine the patient's CHD Risk.   . WBC 11/01/2015 7.3  3.8 - 10.8 K/uL Final  . RBC 11/01/2015 4.63  4.20 - 5.80 MIL/uL Final  . Hemoglobin 11/01/2015 13.6  13.0 - 17.0 g/dL Final  . HCT 11/01/2015 42.1  38.5 - 50.0 % Final  . MCV 11/01/2015 90.9  80.0 - 100.0 fL Final  . MCH 11/01/2015 29.4  27.0 - 33.0 pg Final  . MCHC 11/01/2015 32.3  32.0 - 36.0 g/dL Final  . RDW 11/01/2015 13.9  11.0 - 15.0 % Final  . Platelets 11/01/2015 200  140 - 400 K/uL Final  . MPV 11/01/2015 10.3  7.5 - 12.5 fL Final  . Neutro Abs 11/01/2015 3942  1,500 - 7,800 cells/uL Final  . Lymphs Abs 11/01/2015 2409  850 - 3,900 cells/uL Final  . Monocytes Absolute 11/01/2015 803  200 - 950 cells/uL Final  . Eosinophils Absolute 11/01/2015 146  15 - 500 cells/uL Final  . Basophils Absolute 11/01/2015 0  0 - 200 cells/uL Final  . Neutrophils Relative % 11/01/2015 54  % Final  . Lymphocytes Relative 11/01/2015 33  % Final  . Monocytes Relative 11/01/2015 11  % Final  . Eosinophils Relative 11/01/2015 2  % Final  . Basophils Relative 11/01/2015 0  % Final  . Smear Review 11/01/2015 Criteria for review not met   Final  . PSA 11/01/2015 2.6  <=4.0 ng/mL Final   Comment:   The total PSA value from this assay system is standardized against the WHO standard. The test result will be approximately 20% lower when compared to the equimolar-standardized total PSA (Beckman Coulter). Comparison of serial PSA results should be interpreted with this fact in mind.   This test was performed using the Siemens chemiluminescent method. Values obtained from different assay methods cannot be used interchangeably. PSA levels, regardless of value, should not  be interpreted as absolute evidence of the presence or absence of disease.   Effective October 22, 2015, Total PSA is being tested on the Siemens Centaur XP using chemiluminescence methodology. Re-baseline testing will be available until January 21, 2016 at no charge. If you have a patient that may require re-baselining, please order [id number] in addition to [id number].      Review Past Medical/Family/Social:  Past Medical History:  Diagnosis Date  . Atrial flutter ([place])    a. s/p CTI ablation x2 by Dr Rayann Heman 10/12, 9/13  . Basal cell carcinoma of cheek   . BPH (benign prostatic hyperplasia)   . Diverticulosis   . Ectopic atrial tachycardia ([place])    a. Dx 07/2015: rate controlled   . Hemorrhoids   . Hyperlipidemia   . Hypertension   . Hypertrophic cardiomyopathy ([place])   . TIA (transient ischemic attack)    Past Surgical History:  Procedure Laterality Date  . ATRIAL ABLATION SURGERY  10/12   CTI ablation for atrial flutter by Dr Rayann Heman  . ATRIAL FLUTTER ABLATION N/A 12/11/2011   CTI ablation for atrial flutter by Dr Rayann Heman  . BASAL CELL CARCINOMA EXCISION  ~ 1973   left cheek  . CARDIAC CATHETERIZATION  1960's  . HERNIA REPAIR  ~ 4540   umbilical  . JOINT REPLACEMENT    . TONSILLECTOMY AND ADENOIDECTOMY  1947  . TOTAL HIP ARTHROPLASTY     left   Current Outpatient Prescriptions on File Prior to Visit  Medication Sig Dispense Refill  . apixaban (ELIQUIS) 5 MG TABS tablet Take 1 tablet (5 mg total) by mouth 2 (two) times daily. 180 tablet 3  . Diphenhydramine-APAP, sleep, (TYLENOL PM  EXTRA STRENGTH PO) Take 1 tablet by mouth at bedtime as needed.    Marland Kitchen lisinopril (PRINIVIL,ZESTRIL) 10 MG tablet Take 1 tablet (10 mg total) by mouth daily. 90 tablet 3  . mefloquine (LARIAM) 250 MG tablet '250mg'$  q weekly, starting 2 weeks before he leaves, take q weekly while in [place] and take for another 4 weeks upon return 10 tablet 0  . simvastatin (ZOCOR) 40 MG tablet TAKE 1 TABLET (40 MG  TOTAL) BY MOUTH AT BEDTIME. 90 tablet 2   No current facility-administered medications on file prior to visit.    Allergies  Allergen Reactions  . Penicillins Other (See Comments)    Unknown reaction; "told I might have allergy 1960's"   Social History   Social History  . Marital status: Married    Spouse name: N/A  . Number of children: 2  . Years of education: N/A   Occupational History  . retired    Social History Main Topics  . Smoking status: Former Smoker    Packs/day: 2.00    Years: 10.00    Types: Cigarettes    Quit date: 03/17/1970  . Smokeless tobacco: Never Used  . Alcohol use Yes     Comment: quit 1995  . Drug use: No  . Sexual activity: Yes   Other Topics Concern  . Not on file   Social History Narrative   Lives in [place] with spouse.  2 Grown children.  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 Colon polyps Brother   . Tuberculosis Brother     and father  . Breast cancer Mother     Depression Screen  (Note: if answer to either of the following is "Yes", a more complete depression screening is indicated)  Over the past two weeks, have you felt down, depressed or hopeless? No Over the past two weeks, have you felt little interest or pleasure in doing things? No Have you lost interest or pleasure in daily life? No Do you often feel hopeless? No Do you cry easily over simple problems? No   Activities of Daily Living  In your present state of health, do you have any difficulty performing the following activities?:  Driving? No  Managing money? No  Feeding yourself? No  Getting from bed to chair? No  Climbing a flight of stairs? No  Preparing food and eating?: No  Bathing or showering? No  Getting dressed: No  Getting to the toilet? No  Using the toilet:No  Moving around from place to place: No  In the past year have you fallen or had a near fall?:No  Are you sexually active? No  Do you  have more than one partner? No   Hearing Difficulties: No  Do you often ask people to speak up or repeat themselves? No  Do you experience ringing or noises in your ears? No Do you have difficulty understanding soft or whispered voices? No  Do you feel that you have a problem with memory? No Do you often misplace items? No  Do you feel safe at home? Yes  Cognitive Testing  Alert? Yes Normal Appearance?Yes  Oriented to person? Yes Place? Yes  Time? Yes  Recall of three objects? Yes  Can perform simple calculations? Yes  Displays appropriate judgment?Yes  Can read the correct time from a watch face?Yes   Screening Tests / Date Colonoscopy       2012 (q 5 years)  Zostavax UTD Pneumovax 23- UTD Influenza Vaccine UTD Tetanus/tdap 2013 Prevnar 10/16       Review of Systems  All other systems reviewed and are negative.      Objective:   Physical Exam  Constitutional: He is oriented to person, place, and time. He appears well-developed and well-nourished. No distress.  HENT:  Head: Normocephalic and atraumatic.  Right Ear: External ear normal.  Left Ear: External ear normal.  Nose: Nose normal.  Mouth/Throat: Oropharynx is clear and moist. No oropharyngeal exudate.  Eyes: Conjunctivae and EOM are normal. Pupils are equal, round, and reactive to light. Right eye exhibits no discharge. Left eye exhibits no discharge. No scleral icterus.  Neck: Normal range of motion. Neck supple. No JVD present. No tracheal deviation present. No thyromegaly present.  Cardiovascular: Normal rate, regular rhythm, normal heart sounds and intact distal pulses.  Exam reveals no gallop and no friction rub.   No murmur heard. Pulmonary/Chest: Effort normal and breath sounds normal. No stridor. No respiratory distress. He has no wheezes. He has no rales. He exhibits no tenderness.  Abdominal: Soft. Bowel sounds are normal. He exhibits no distension and no mass. There is no tenderness.  There is no rebound and no guarding.  Genitourinary: Rectum normal, prostate normal and penis normal.  Musculoskeletal: Normal range of motion. He exhibits no edema or tenderness.  Lymphadenopathy:    He has no cervical adenopathy.  Neurological: He is alert and oriented to person, place, and time. He has normal reflexes. No cranial nerve deficit. He exhibits normal muscle tone. Coordination normal.  Skin: Skin is warm. No rash noted. He is not diaphoretic. No erythema. No pallor.  Psychiatric: He has a normal mood and affect. His behavior is normal. Judgment and thought content normal.  Vitals reviewed.         Assessment & Plan:  1. Routine general medical examination at a health care facility Physical exam is completely normal. PSA is excellent. Digital rectal exam is normal. He is due for colonoscopy but he declines this today. We discussed cologuard but he defers at the present time. All immunizations are up-to-date. Lab work is excellent. He has mild chronic kidney disease. We will recheck this every 6 months. Avoid NSAIDs and drink plenty of fluids Screen negative for depression.  Medicare Attestation  I have personally reviewed:  The patient's medical and social history  Their use of alcohol, tobacco or illicit drugs  Their current medications and supplements  The patient's functional ability including ADLs,fall risks, home safety risks, cognitive, and hearing and visual impairment  Diet and physical activities  Evidence for depression or mood disorders  The patient's weight, height, BMI, and visual acuity have been recorded in the chart. I have made referrals, counseling, and provided education to the patient based on review of the above and I have provided the patient with a written personalized care plan for preventive services.

## 2015-11-27 ENCOUNTER — Other Ambulatory Visit: Payer: Self-pay | Admitting: Internal Medicine

## 2015-11-28 ENCOUNTER — Encounter: Payer: Self-pay | Admitting: Gastroenterology

## 2015-11-28 ENCOUNTER — Encounter: Payer: Self-pay | Admitting: Internal Medicine

## 2015-12-18 ENCOUNTER — Ambulatory Visit (INDEPENDENT_AMBULATORY_CARE_PROVIDER_SITE_OTHER): Payer: PPO

## 2015-12-18 DIAGNOSIS — Z23 Encounter for immunization: Secondary | ICD-10-CM

## 2015-12-26 DIAGNOSIS — H401111 Primary open-angle glaucoma, right eye, mild stage: Secondary | ICD-10-CM | POA: Diagnosis not present

## 2016-02-20 DIAGNOSIS — H401122 Primary open-angle glaucoma, left eye, moderate stage: Secondary | ICD-10-CM | POA: Diagnosis not present

## 2016-03-18 ENCOUNTER — Other Ambulatory Visit: Payer: Self-pay | Admitting: *Deleted

## 2016-03-18 MED ORDER — APIXABAN 5 MG PO TABS: 5.0000 mg | ORAL_TABLET | Freq: Two times a day (BID) | ORAL | 1 refills | Status: DC

## 2016-04-10 ENCOUNTER — Encounter: Payer: Self-pay | Admitting: Internal Medicine

## 2016-04-22 DIAGNOSIS — D225 Melanocytic nevi of trunk: Secondary | ICD-10-CM | POA: Diagnosis not present

## 2016-04-22 DIAGNOSIS — L821 Other seborrheic keratosis: Secondary | ICD-10-CM | POA: Diagnosis not present

## 2016-04-22 DIAGNOSIS — L814 Other melanin hyperpigmentation: Secondary | ICD-10-CM | POA: Diagnosis not present

## 2016-04-22 DIAGNOSIS — Z85820 Personal history of malignant melanoma of skin: Secondary | ICD-10-CM | POA: Diagnosis not present

## 2016-05-07 ENCOUNTER — Other Ambulatory Visit: Payer: PPO

## 2016-05-28 ENCOUNTER — Encounter: Payer: Self-pay | Admitting: Internal Medicine

## 2016-05-28 ENCOUNTER — Ambulatory Visit (INDEPENDENT_AMBULATORY_CARE_PROVIDER_SITE_OTHER): Payer: PPO | Admitting: Internal Medicine

## 2016-05-28 VITALS — BP 124/68 | HR 64 | Ht 70.0 in | Wt 180.0 lb

## 2016-05-28 DIAGNOSIS — Z86010 Personal history of colonic polyps: Secondary | ICD-10-CM

## 2016-05-28 DIAGNOSIS — Z7901 Long term (current) use of anticoagulants: Secondary | ICD-10-CM | POA: Diagnosis not present

## 2016-05-28 MED ORDER — NA SULFATE-K SULFATE-MG SULF 17.5-3.13-1.6 GM/177ML PO SOLN: ORAL | 0 refills | Status: DC

## 2016-05-28 NOTE — Patient Instructions (Signed)
You have been scheduled for a colonoscopy. Please follow written instructions given to you at your visit today.  Please pick up your prep supplies at the pharmacy within the next 1-3 days. If you use inhalers (even only as needed), please bring them with you on the day of your procedure. Your physician has requested that you go to www.startemmi.com and enter the access code given to you at your visit today. This web site gives a general overview about your procedure. However, you should still follow specific instructions given to you by our office regarding your preparation for the procedure.  If you are age 60 or older, your body mass index should be between 23-30. Your Body mass index is 25.83 kg/m. If this is out of the aforementioned range listed, please consider follow up with your Primary Care Provider.  If you are age 37 or younger, your body mass index should be between 19-25. Your Body mass index is 25.83 kg/m. If this is out of the aformentioned range listed, please consider follow up with your Primary Care Provider.

## 2016-05-28 NOTE — Progress Notes (Signed)
Patient ID: [name], male   DOB: 13-Aug-1940, 76 y.o.   MRN: [id number] HPI: [name] is a 76 year old male with a past medical history of colon polyps, normal colonoscopy in 2012 except for mild diverticulosis who is here to discuss surveillance colonoscopy. He has a history of atrial flutter status post ablation, TIA and is on Eliquis daily. He also has a history of hypertrophic cardiomyopathy, hypertension hyperlipidemia and CKD.  He reports that he has been feeling well. He has no GI complaint. He denies change in bowel habit, diarrhea, constipation, blood in his stool or melena. No abdominal pain. Good appetite. No trouble swallowing. No hepatobiliary complaint.  He is quite concerned about coming off Eliquis for surveillance colonoscopy.  Family history notable for a brother who also had colon polyps.  Colonoscopy 2002 -- several tubular adenomas removed; colonoscopy 2005 -- tubulovillous adenoma removed  Past Medical History:  Diagnosis Date  . Atrial flutter ([place])    a. s/p CTI ablation x2 by Dr Rayann Heman 10/12, 9/13  . Basal cell carcinoma of cheek   . BPH (benign prostatic hyperplasia)   . CKD (chronic kidney disease) stage 3, GFR 30-59 ml/min   . Colon polyps   . Diverticulosis   . Ectopic atrial tachycardia ([place])    a. Dx 07/2015: rate controlled   . Hemorrhoids   . Hyperlipidemia   . Hypertension   . Hypertrophic cardiomyopathy ([place])   . TIA (transient ischemic attack)     Past Surgical History:  Procedure Laterality Date  . ATRIAL ABLATION SURGERY  10/12   CTI ablation for atrial flutter by Dr Rayann Heman  . ATRIAL FLUTTER ABLATION N/A 12/11/2011   CTI ablation for atrial flutter by Dr Rayann Heman  . BASAL CELL CARCINOMA EXCISION  ~ 1973   left cheek  . CARDIAC CATHETERIZATION  1960's  . HERNIA REPAIR  ~ 9518   umbilical  . JOINT REPLACEMENT    . TONSILLECTOMY AND ADENOIDECTOMY  1947  . TOTAL HIP ARTHROPLASTY     left    Outpatient Medications Prior to Visit   Medication Sig Dispense Refill  . apixaban (ELIQUIS) 5 MG TABS tablet Take 1 tablet (5 mg total) by mouth 2 (two) times daily. 180 tablet 1  . Diphenhydramine-APAP, sleep, (TYLENOL PM EXTRA STRENGTH PO) Take 1 tablet by mouth at bedtime as needed.    Marland Kitchen lisinopril (PRINIVIL,ZESTRIL) 10 MG tablet Take 1 tablet (10 mg total) by mouth daily. 90 tablet 3  . simvastatin (ZOCOR) 40 MG tablet Take 1 tablet (40 mg total) by mouth at bedtime. 90 tablet 3  . mefloquine (LARIAM) 250 MG tablet 250mg  q weekly, starting 2 weeks before he leaves, take q weekly while in [place] and take for another 4 weeks upon return 10 tablet 0   No facility-administered medications prior to visit.     Allergies  Allergen Reactions  . Penicillins Other (See Comments)    Unknown reaction; "told I might have allergy 1960's"    Family History  Problem Relation Age of Onset  . Breast cancer Mother   . Ulcers Father     stomach  . COPD Father   . Tuberculosis Father   . Colon polyps Brother   . Tuberculosis Brother   . Brain cancer Brother   . Colon cancer Neg Hx   . Stomach cancer Neg Hx   . Rectal cancer Neg Hx   . Esophageal cancer Neg Hx   . Liver cancer Neg Hx  Social History  Substance Use Topics  . Smoking status: Former Smoker    Packs/day: 2.00    Years: 10.00    Types: Cigarettes    Quit date: 03/17/1970  . Smokeless tobacco: Never Used  . Alcohol use No     Comment: quit 1995    ROS: As per history of present illness, otherwise negative  BP 124/68   Pulse 64   Ht 5\' 10"  (1.778 m)   Wt 180 lb (81.6 kg)   BMI 25.83 kg/m  Constitutional: Well-developed and well-nourished. No distress. HEENT: Normocephalic and atraumatic. Oropharynx is clear and moist. No oropharyngeal exudate. Conjunctivae are normal.  No scleral icterus. Neck: Neck supple. Trachea midline. Cardiovascular: Normal rate, regular rhythm and intact distal pulses. No M/R/G Pulmonary/chest: Effort normal and breath sounds  normal. No wheezing, rales or rhonchi. Abdominal: Soft, nontender, nondistended. Bowel sounds active throughout. There are no masses palpable. No hepatosplenomegaly. Extremities: no clubbing, cyanosis, or edema Lymphadenopathy: No cervical adenopathy noted. Neurological: Alert and oriented to person place and time. Skin: Skin is warm and dry.  Psychiatric: Normal mood and affect. Behavior is normal.  RELEVANT LABS AND IMAGING: CBC    Component Value Date/Time   WBC 7.3 11/01/2015 0854   RBC 4.63 11/01/2015 0854   HGB 13.6 11/01/2015 0854   HCT 42.1 11/01/2015 0854   PLT 200 11/01/2015 0854   MCV 90.9 11/01/2015 0854   MCH 29.4 11/01/2015 0854   MCHC 32.3 11/01/2015 0854   RDW 13.9 11/01/2015 0854   LYMPHSABS 2,409 11/01/2015 0854   MONOABS 803 11/01/2015 0854   EOSABS 146 11/01/2015 0854   BASOSABS 0 11/01/2015 0854    CMP     Component Value Date/Time   NA 138 11/01/2015 0854   K 4.5 11/01/2015 0854   CL 105 11/01/2015 0854   CO2 23 11/01/2015 0854   GLUCOSE 86 11/01/2015 0854   BUN 23 11/01/2015 0854   CREATININE 1.41 (H) 11/01/2015 0854   CALCIUM 8.8 11/01/2015 0854   PROT 6.0 (L) 11/01/2015 0854   ALBUMIN 3.8 11/01/2015 0854   AST 21 11/01/2015 0854   ALT 16 11/01/2015 0854   ALKPHOS 22 (L) 11/01/2015 0854   BILITOT 0.6 11/01/2015 0854   GFRNONAA 48 (L) 11/01/2015 0854   GFRAA 56 (L) 11/01/2015 0854    ASSESSMENT/PLAN: 76 year old male with a past medical history of colon polyps, normal colonoscopy in 2012 except for mild diverticulosis who is here to discuss surveillance colonoscopy.  1. Personal history of colon polyps/long-term anticoagulations therapy -- I do recommend repeat colonoscopy for surveillance given his history of adenomatous and tubulovillous adenomatous polyps in the past. We discussed that normally we would hold Eliquis with permission from his cardiologist prior to this procedure. After discussion of risks, benefits and alternatives he is  worried about complications off Eliquis specifically TIA or stroke. After this discussion we have decided to proceed with screening/surveillance colonoscopy on Eliquis. We specifically discussed that if medium or large polyps are found we will not be able to remove them during this next colonoscopy but rather repeat the exam off of Eliquis. He also understands that if small polyps are removed there is a higher than average risk for bleeding.    NT:ZGYFVC T Pickard, Md [address] [place], [place] [id number]

## 2016-05-30 ENCOUNTER — Encounter: Payer: Self-pay | Admitting: Internal Medicine

## 2016-06-12 ENCOUNTER — Ambulatory Visit (AMBULATORY_SURGERY_CENTER): Payer: PPO | Admitting: Internal Medicine

## 2016-06-12 ENCOUNTER — Encounter: Payer: Self-pay | Admitting: Internal Medicine

## 2016-06-12 VITALS — BP 105/67 | HR 54 | Temp 97.5°F | Resp 13 | Ht 70.0 in | Wt 180.0 lb

## 2016-06-12 DIAGNOSIS — D123 Benign neoplasm of transverse colon: Secondary | ICD-10-CM

## 2016-06-12 DIAGNOSIS — D120 Benign neoplasm of cecum: Secondary | ICD-10-CM

## 2016-06-12 DIAGNOSIS — Z8673 Personal history of transient ischemic attack (TIA), and cerebral infarction without residual deficits: Secondary | ICD-10-CM | POA: Diagnosis not present

## 2016-06-12 DIAGNOSIS — I4892 Unspecified atrial flutter: Secondary | ICD-10-CM | POA: Diagnosis not present

## 2016-06-12 DIAGNOSIS — D124 Benign neoplasm of descending colon: Secondary | ICD-10-CM

## 2016-06-12 DIAGNOSIS — Z86010 Personal history of colonic polyps: Secondary | ICD-10-CM | POA: Diagnosis not present

## 2016-06-12 DIAGNOSIS — I10 Essential (primary) hypertension: Secondary | ICD-10-CM | POA: Diagnosis not present

## 2016-06-12 MED ORDER — SODIUM CHLORIDE 0.9 % IV SOLN: 500.0000 mL | INTRAVENOUS | Status: DC

## 2016-06-12 NOTE — Progress Notes (Signed)
Called to room to assist during endoscopic procedure.  Patient ID and intended procedure confirmed with present staff. Received instructions for my participation in the procedure from the performing physician.  

## 2016-06-12 NOTE — Progress Notes (Signed)
Pt's states no medical or surgical changes since previsit or office visit. 

## 2016-06-12 NOTE — Op Note (Signed)
[facility] Patient Name: [name] Procedure Date: 06/12/2016 8:53 AM MRN: [id number] Endoscopist: Jerene Bears , MD Age: 76 Referring MD:  Date of Birth: Nov 01, 1940 Gender: Male Account #: [id number] Procedure:                Colonoscopy Indications:              Surveillance: Personal history of adenomatous                            polyps on last colonoscopy 6 years ago Medicines:                Monitored Anesthesia Care Procedure:                Pre-Anesthesia Assessment:                           - Prior to the procedure, a History and Physical                            was performed, and patient medications and                            allergies were reviewed. The patient's tolerance of                            previous anesthesia was also reviewed. The risks                            and benefits of the procedure and the sedation                            options and risks were discussed with the patient.                            All questions were answered, and informed consent                            was obtained. Prior Anticoagulants: The patient has                            taken Eliquis (apixaban), last dose was 1 day prior                            to procedure. ASA Grade Assessment: III - A patient                            with severe systemic disease. After reviewing the                            risks and benefits, the patient was deemed in                            satisfactory condition to undergo the procedure.  After obtaining informed consent, the colonoscope                            was passed under direct vision. Throughout the                            procedure, the patient's blood pressure, pulse, and                            oxygen saturations were monitored continuously. The                            Colonoscope was introduced through the anus and                            advanced to the  the cecum, identified by                            appendiceal orifice and ileocecal valve. The                            colonoscopy was performed without difficulty. The                            patient tolerated the procedure well. The quality                            of the bowel preparation was good. The ileocecal                            valve, appendiceal orifice, and rectum were                            photographed. Scope In: 9:07:06 AM Scope Out: 9:19:31 AM Scope Withdrawal Time: 0 hours 9 minutes 55 seconds  Total Procedure Duration: 0 hours 12 minutes 25 seconds  Findings:                 The digital rectal exam was normal.                           A 3 mm polyp was found in the ileocecal valve. The                            polyp was sessile. The polyp was removed with a                            cold biopsy forceps. Resection and retrieval were                            complete.                           A 4 mm polyp was found in the splenic flexure. The  polyp was sessile. The polyp was removed with a                            cold biopsy forceps. Resection and retrieval were                            complete.                           A 4 mm polyp was found in the descending colon. The                            polyp was sessile. The polyp was removed with a                            cold biopsy forceps. Resection and retrieval were                            complete.                           Multiple small and large-mouthed diverticula were                            found in the sigmoid colon, descending colon and                            ascending colon.                           The retroflexed view of the distal rectum and anal                            verge was normal and showed no anal or rectal                            abnormalities. Complications:            No immediate complications. Estimated Blood Loss:      Estimated blood loss: none. Estimated blood loss                            was minimal. Impression:               - One 3 mm polyp at the ileocecal valve, removed                            with a cold biopsy forceps. Resected and retrieved.                           - One 4 mm polyp at the splenic flexure, removed                            with a cold biopsy forceps. Resected and retrieved.                           -  One 4 mm polyp in the descending colon, removed                            with a cold biopsy forceps. Resected and retrieved.                           - Moderate diverticulosis in the sigmoid colon, in                            the descending colon and in the ascending colon.                           - The distal rectum and anal verge are normal on                            retroflexion view. Recommendation:           - Patient has a contact number available for                            emergencies. The signs and symptoms of potential                            delayed complications were discussed with the                            patient. Return to normal activities tomorrow.                            Written discharge instructions were provided to the                            patient.                           - Resume previous diet.                           - Continue present medications.                           - Await pathology results.                           - No recommendation at this time regarding repeat                            colonoscopy due to age. Jerene Bears, MD 06/12/2016 9:23:51 AM This report has been signed electronically.

## 2016-06-12 NOTE — Progress Notes (Signed)
Report to PACU, RN, vss, BBS= Clear.  

## 2016-06-12 NOTE — Patient Instructions (Signed)
YOU HAD AN ENDOSCOPIC PROCEDURE TODAY AT THE [facility]:   Refer to the procedure report that was given to you for any specific questions about what was found during the examination.  If the procedure report does not answer your questions, please call your gastroenterologist to clarify.  If you requested that your care partner not be given the details of your procedure findings, then the procedure report has been included in a sealed envelope for you to review at your convenience later.  YOU SHOULD EXPECT: Some feelings of bloating in the abdomen. Passage of more gas than usual.  Walking can help get rid of the air that was put into your GI tract during the procedure and reduce the bloating. If you had a lower endoscopy (such as a colonoscopy or flexible sigmoidoscopy) you may notice spotting of blood in your stool or on the toilet paper. If you underwent a bowel prep for your procedure, you may not have a normal bowel movement for a few days.  Please Note:  You might notice some irritation and congestion in your nose or some drainage.  This is from the oxygen used during your procedure.  There is no need for concern and it should clear up in a day or so.  SYMPTOMS TO REPORT IMMEDIATELY:   Following lower endoscopy (colonoscopy or flexible sigmoidoscopy):  Excessive amounts of blood in the stool  Significant tenderness or worsening of abdominal pains  Swelling of the abdomen that is new, acute  Fever of 100F or higher    For urgent or emergent issues, a gastroenterologist can be reached at any hour by calling [phone].   DIET:  We do recommend a small meal at first, but then you may proceed to your regular diet.  Drink plenty of fluids but you should avoid alcoholic beverages for 24 hours.  ACTIVITY:  You should plan to take it easy for the rest of today and you should NOT DRIVE or use heavy machinery until tomorrow (because of the sedation medicines used during the test).     FOLLOW UP: Our staff will call the number listed on your records the next business day following your procedure to check on you and address any questions or concerns that you may have regarding the information given to you following your procedure. If we do not reach you, we will leave a message.  However, if you are feeling well and you are not experiencing any problems, there is no need to return our call.  We will assume that you have returned to your regular daily activities without incident.  If any biopsies were taken you will be contacted by phone or by letter within the next 1-3 weeks.  Please call us at [phone] if you have not heard about the biopsies in 3 weeks.    SIGNATURES/CONFIDENTIALITY: You and/or your care partner have signed paperwork which will be entered into your electronic medical record.  These signatures attest to the fact that that the information above on your After Visit Summary has been reviewed and is understood.  Full responsibility of the confidentiality of this discharge information lies with you and/or your care-partner.   Resume medications. Information given on polyps and diverticulosis. 

## 2016-06-16 ENCOUNTER — Telehealth: Payer: Self-pay

## 2016-06-16 NOTE — Telephone Encounter (Signed)
  Follow up Call-  Call back number 06/12/2016  Post procedure Call Back phone  # [phone]  Permission to leave phone message Yes  Some recent data might be hidden     Patient questions:  Do you have a fever, pain , or abdominal swelling? No. Pain Score  0 *  Have you tolerated food without any problems? Yes.    Have you been able to return to your normal activities? Yes.    Do you have any questions about your discharge instructions: Diet   No. Medications  No. Follow up visit  No.  Do you have questions or concerns about your Care? No.  Actions: * If pain score is 4 or above: No action needed, pain <4.

## 2016-06-18 ENCOUNTER — Encounter: Payer: Self-pay | Admitting: Internal Medicine

## 2016-09-22 ENCOUNTER — Other Ambulatory Visit: Payer: Self-pay | Admitting: Internal Medicine

## 2016-10-06 DIAGNOSIS — H26491 Other secondary cataract, right eye: Secondary | ICD-10-CM | POA: Diagnosis not present

## 2016-10-06 DIAGNOSIS — H52203 Unspecified astigmatism, bilateral: Secondary | ICD-10-CM | POA: Diagnosis not present

## 2016-10-06 DIAGNOSIS — H401131 Primary open-angle glaucoma, bilateral, mild stage: Secondary | ICD-10-CM | POA: Diagnosis not present

## 2016-10-15 ENCOUNTER — Telehealth: Payer: Self-pay | Admitting: Family Medicine

## 2016-10-15 MED ORDER — LISINOPRIL 10 MG PO TABS: 10.0000 mg | ORAL_TABLET | Freq: Every day | ORAL | 3 refills | Status: DC

## 2016-10-15 NOTE — Telephone Encounter (Signed)
Medication called/sent to requested pharmacy  

## 2016-10-15 NOTE — Telephone Encounter (Signed)
Pt needs refill on lisinopril sent to [facility] mail order.

## 2016-10-21 DIAGNOSIS — Z85820 Personal history of malignant melanoma of skin: Secondary | ICD-10-CM | POA: Diagnosis not present

## 2016-10-21 DIAGNOSIS — L814 Other melanin hyperpigmentation: Secondary | ICD-10-CM | POA: Diagnosis not present

## 2016-10-21 DIAGNOSIS — D225 Melanocytic nevi of trunk: Secondary | ICD-10-CM | POA: Diagnosis not present

## 2016-10-21 DIAGNOSIS — L821 Other seborrheic keratosis: Secondary | ICD-10-CM | POA: Diagnosis not present

## 2016-11-03 ENCOUNTER — Other Ambulatory Visit: Payer: PPO

## 2016-11-03 DIAGNOSIS — Z79899 Other long term (current) drug therapy: Secondary | ICD-10-CM | POA: Diagnosis not present

## 2016-11-03 DIAGNOSIS — I10 Essential (primary) hypertension: Secondary | ICD-10-CM | POA: Diagnosis not present

## 2016-11-03 DIAGNOSIS — N400 Enlarged prostate without lower urinary tract symptoms: Secondary | ICD-10-CM | POA: Diagnosis not present

## 2016-11-03 DIAGNOSIS — Z0000 Encounter for general adult medical examination without abnormal findings: Secondary | ICD-10-CM

## 2016-11-03 DIAGNOSIS — E785 Hyperlipidemia, unspecified: Secondary | ICD-10-CM | POA: Diagnosis not present

## 2016-11-03 LAB — CBC WITH DIFFERENTIAL/PLATELET
Basophils Absolute: 0 cells/uL (ref 0–200)
Basophils Relative: 0 %
Eosinophils Absolute: 124 cells/uL (ref 15–500)
Eosinophils Relative: 2 %
HCT: 43 % (ref 38.5–50.0)
Hemoglobin: 14 g/dL (ref 13.0–17.0)
Lymphocytes Relative: 37 %
Lymphs Abs: 2294 cells/uL (ref 850–3900)
MCH: 30 pg (ref 27.0–33.0)
MCHC: 32.6 g/dL (ref 32.0–36.0)
MCV: 92.1 fL (ref 80.0–100.0)
MPV: 10.3 fL (ref 7.5–12.5)
Monocytes Absolute: 558 cells/uL (ref 200–950)
Monocytes Relative: 9 %
Neutro Abs: 3224 cells/uL (ref 1500–7800)
Neutrophils Relative %: 52 %
Platelets: 212 10*3/uL (ref 140–400)
RBC: 4.67 MIL/uL (ref 4.20–5.80)
RDW: 13.7 % (ref 11.0–15.0)
WBC: 6.2 10*3/uL (ref 3.8–10.8)

## 2016-11-04 LAB — COMPLETE METABOLIC PANEL WITH GFR
ALT: 12 U/L (ref 9–46)
AST: 18 U/L (ref 10–35)
Albumin: 4.2 g/dL (ref 3.6–5.1)
Alkaline Phosphatase: 25 U/L — ABNORMAL LOW (ref 40–115)
BUN: 26 mg/dL — ABNORMAL HIGH (ref 7–25)
CO2: 24 mmol/L (ref 20–32)
Calcium: 9.4 mg/dL (ref 8.6–10.3)
Chloride: 104 mmol/L (ref 98–110)
Creat: 1.25 mg/dL — ABNORMAL HIGH (ref 0.70–1.18)
GFR, Est African American: 64 mL/min (ref >=60)
GFR, Est Non African American: 56 mL/min — ABNORMAL LOW (ref >=60)
Glucose, Bld: 100 mg/dL — ABNORMAL HIGH (ref 70–99)
Potassium: 5.5 mmol/L — ABNORMAL HIGH (ref 3.5–5.3)
Sodium: 137 mmol/L (ref 135–146)
Total Bilirubin: 0.7 mg/dL (ref 0.2–1.2)
Total Protein: 6.3 g/dL (ref 6.1–8.1)

## 2016-11-04 LAB — LIPID PANEL
Cholesterol: 192 mg/dL (ref <200)
HDL: 63 mg/dL (ref >40)
LDL Cholesterol: 110 mg/dL — ABNORMAL HIGH (ref <100)
Total CHOL/HDL Ratio: 3 Ratio (ref <5.0)
Triglycerides: 93 mg/dL (ref <150)
VLDL: 19 mg/dL (ref <30)

## 2016-11-04 LAB — PSA: PSA: 2.8 ng/mL (ref <=4.0)

## 2016-11-06 ENCOUNTER — Ambulatory Visit (INDEPENDENT_AMBULATORY_CARE_PROVIDER_SITE_OTHER): Payer: PPO | Admitting: Family Medicine

## 2016-11-06 ENCOUNTER — Encounter: Payer: Self-pay | Admitting: Family Medicine

## 2016-11-06 ENCOUNTER — Telehealth: Payer: Self-pay | Admitting: Family Medicine

## 2016-11-06 VITALS — BP 126/72 | HR 58 | Temp 97.8°F | Resp 12 | Ht 70.0 in | Wt 166.0 lb

## 2016-11-06 DIAGNOSIS — E7800 Pure hypercholesterolemia, unspecified: Secondary | ICD-10-CM | POA: Diagnosis not present

## 2016-11-06 DIAGNOSIS — Z8673 Personal history of transient ischemic attack (TIA), and cerebral infarction without residual deficits: Secondary | ICD-10-CM

## 2016-11-06 DIAGNOSIS — Z86010 Personal history of colonic polyps: Secondary | ICD-10-CM | POA: Diagnosis not present

## 2016-11-06 DIAGNOSIS — Z0000 Encounter for general adult medical examination without abnormal findings: Secondary | ICD-10-CM

## 2016-11-06 DIAGNOSIS — I4892 Unspecified atrial flutter: Secondary | ICD-10-CM

## 2016-11-06 DIAGNOSIS — I10 Essential (primary) hypertension: Secondary | ICD-10-CM

## 2016-11-06 MED ORDER — ROSUVASTATIN CALCIUM 20 MG PO TABS: 20.0000 mg | ORAL_TABLET | Freq: Every day | ORAL | 3 refills | Status: DC

## 2016-11-06 NOTE — Telephone Encounter (Signed)
Medication called/sent to requested pharmacy  

## 2016-11-06 NOTE — Telephone Encounter (Signed)
New Message   *STAT* If patient is at the pharmacy, call can be transferred to refill team.   1. Which medications need to be refilled? (please list name of each medication and dose if known)  rosuvastatin (Crestor) 20 mg tablet once daily  2. Which pharmacy/location (including street and city if local pharmacy) is medication to be sent to? [facility]  3. Do they need a 30 day or 90 day supply?  90 day supply  Pt voiced needing prescription sent to [facility] instead of [facility]

## 2016-11-06 NOTE — Progress Notes (Signed)
Subjective:    Patient ID: [name], male    DOB: Nov 25, 1940, 76 y.o.   MRN: [id number]  HPI  Subjective:   Patient presents for Medicare Annual/Subsequent preventive examination. His asked medical history is significant for congestive heart failure/cardiomyopathy with an ejection fraction of 25-30% on echocardiogram in 2015. Repeat echocardiograms have revealed an ejection fraction of 50-55%. He is completely asymptomatic and by definition than would be grade 1 NYHA heart failure class. We had a discussion today about new medications for congestive heart failure, particularly Entresto.  His blood pressure remains borderline low at home. He is completely asymptomatic. Most recent echocardiogram revealed an ejection fraction of 55%. Therefore, together, we decided this medication was not appropriate for him at the present time. He also has a history of atrial fibrillation/atrial flutter status post ablation. He is appropriately anticoagulated. He denies any tachycardia or palpitations. Today he is in normal sinus rhythm. He also has a past medical history of colon polyps. Patient had a colonoscopy this year.  Patient has no medical concerns. His most recent lab work as listed below.  Lab on 11/03/2016  Component Date Value Ref Range Status  . Sodium 11/03/2016 137  135 - 146 mmol/L Final  . Potassium 11/03/2016 5.5* 3.5 - 5.3 mmol/L Final   No visible hemolysis.  . Chloride 11/03/2016 104  98 - 110 mmol/L Final  . CO2 11/03/2016 24  20 - 32 mmol/L Final   Comment: ** Please note change in reference range(s). **     . Glucose, Bld 11/03/2016 100* 70 - 99 mg/dL Final  . BUN 11/03/2016 26* 7 - 25 mg/dL Final  . Creat 11/03/2016 1.25* 0.70 - 1.18 mg/dL Final   Comment:   For patients > or = 76 years of age: The upper reference limit for Creatinine is approximately 13% higher for people identified as African-American.     . Total Bilirubin 11/03/2016 0.7  0.2 - 1.2 mg/dL Final  .  Alkaline Phosphatase 11/03/2016 25* 40 - 115 U/L Final  . AST 11/03/2016 18  10 - 35 U/L Final  . ALT 11/03/2016 12  9 - 46 U/L Final  . Total Protein 11/03/2016 6.3  6.1 - 8.1 g/dL Final  . Albumin 11/03/2016 4.2  3.6 - 5.1 g/dL Final  . Calcium 11/03/2016 9.4  8.6 - 10.3 mg/dL Final  . GFR, Est African American 11/03/2016 64  >=60 mL/min Final  . GFR, Est Non African American 11/03/2016 56* >=60 mL/min Final  . Cholesterol 11/03/2016 192  <200 mg/dL Final  . Triglycerides 11/03/2016 93  <150 mg/dL Final  . HDL 11/03/2016 63  >40 mg/dL Final  . Total CHOL/HDL Ratio 11/03/2016 3.0  <5.0 Ratio Final  . VLDL 11/03/2016 19  <30 mg/dL Final  . LDL Cholesterol 11/03/2016 110* <100 mg/dL Final  . WBC 11/03/2016 6.2  3.8 - 10.8 K/uL Final  . RBC 11/03/2016 4.67  4.20 - 5.80 MIL/uL Final  . Hemoglobin 11/03/2016 14.0  13.0 - 17.0 g/dL Final  . HCT 11/03/2016 43.0  38.5 - 50.0 % Final  . MCV 11/03/2016 92.1  80.0 - 100.0 fL Final  . MCH 11/03/2016 30.0  27.0 - 33.0 pg Final  . MCHC 11/03/2016 32.6  32.0 - 36.0 g/dL Final  . RDW 11/03/2016 13.7  11.0 - 15.0 % Final  . Platelets 11/03/2016 212  140 - 400 K/uL Final  . MPV 11/03/2016 10.3  7.5 - 12.5 fL Final  . Neutro Abs  11/03/2016 3224  1,500 - 7,800 cells/uL Final  . Lymphs Abs 11/03/2016 2294  850 - 3,900 cells/uL Final  . Monocytes Absolute 11/03/2016 558  200 - 950 cells/uL Final  . Eosinophils Absolute 11/03/2016 124  15 - 500 cells/uL Final  . Basophils Absolute 11/03/2016 0  0 - 200 cells/uL Final  . Neutrophils Relative % 11/03/2016 52  % Final  . Lymphocytes Relative 11/03/2016 37  % Final  . Monocytes Relative 11/03/2016 9  % Final  . Eosinophils Relative 11/03/2016 2  % Final  . Basophils Relative 11/03/2016 0  % Final  . Smear Review 11/03/2016 Criteria for review not met   Final  . PSA 11/03/2016 2.8  <=4.0 ng/mL Final   Comment:   The total PSA value from this assay system is standardized against the WHO standard. The test  result will be approximately 20% lower when compared to the equimolar-standardized total PSA (Beckman Coulter). Comparison of serial PSA results should be interpreted with this fact in mind.   This test was performed using the Siemens chemiluminescent method. Values obtained from different assay methods cannot be used interchangeably. PSA levels, regardless of value, should not be interpreted as absolute evidence of the presence or absence of disease.        Review Past Medical/Family/Social:  Past Medical History:  Diagnosis Date  . Atrial flutter ([place])    a. s/p CTI ablation x2 by Dr Rayann Heman 10/12, 9/13  . Basal cell carcinoma of cheek   . BPH (benign prostatic hyperplasia)   . CKD (chronic kidney disease) stage 3, GFR 30-59 ml/min   . Colon polyps   . Diverticulosis   . Ectopic atrial tachycardia ([place])    a. Dx 07/2015: rate controlled   . Hemorrhoids   . Hyperlipidemia   . Hypertension   . Hypertrophic cardiomyopathy ([place])   . TIA (transient ischemic attack)    Past Surgical History:  Procedure Laterality Date  . ATRIAL ABLATION SURGERY  10/12   CTI ablation for atrial flutter by Dr Rayann Heman  . ATRIAL FLUTTER ABLATION N/A 12/11/2011   CTI ablation for atrial flutter by Dr Rayann Heman  . BASAL CELL CARCINOMA EXCISION  ~ 1973   left cheek  . CARDIAC CATHETERIZATION  1960's  . HERNIA REPAIR  ~ 0814   umbilical  . JOINT REPLACEMENT    . TONSILLECTOMY AND ADENOIDECTOMY  1947  . TOTAL HIP ARTHROPLASTY     left   Current Outpatient Prescriptions on File Prior to Visit  Medication Sig Dispense Refill  . apixaban (ELIQUIS) 5 MG TABS tablet Take 1 tablet by mouth twice a day. Call MD to schedule appt. 180 tablet 1  . Diphenhydramine-APAP, sleep, (TYLENOL PM EXTRA STRENGTH PO) Take 1 tablet by mouth at bedtime as needed.    Marland Kitchen lisinopril (PRINIVIL,ZESTRIL) 10 MG tablet Take 1 tablet (10 mg total) by mouth daily. 90 tablet 3  . simvastatin (ZOCOR) 40 MG tablet Take 1 tablet (40 mg  total) by mouth at bedtime. 90 tablet 3   Current Facility-Administered Medications on File Prior to Visit  Medication Dose Route Frequency Provider Last Rate Last Dose  . 0.9 %  sodium chloride infusion  500 mL Intravenous Continuous Pyrtle, Lajuan Lines, MD       Allergies  Allergen Reactions  . Penicillins Other (See Comments)    Unknown reaction; "told I might have allergy 1960's"   Social History   Social History  . Marital status: Married    Spouse  name: N/A  . Number of children: 2  . Years of education: N/A   Occupational History  . retired    Social History Main Topics  . Smoking status: Former Smoker    Packs/day: 2.00    Years: 10.00    Types: Cigarettes    Quit date: 03/17/1970  . Smokeless tobacco: Never Used  . Alcohol use No     Comment: quit 1995  . Drug use: No  . Sexual activity: Yes   Other Topics Concern  . Not on file   Social History Narrative   Lives in [place] with spouse.  2 Grown children.  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 Breast cancer Mother   . Ulcers Father        stomach  . COPD Father   . Tuberculosis Father   . Colon polyps Brother   . Tuberculosis Brother   . Brain cancer Brother   . Colon cancer Neg Hx   . Stomach cancer Neg Hx   . Rectal cancer Neg Hx   . Esophageal cancer Neg Hx   . Liver cancer Neg Hx     Depression Screen  (Note: if answer to either of the following is "Yes", a more complete depression screening is indicated)  Over the past two weeks, have you felt down, depressed or hopeless? No Over the past two weeks, have you felt little interest or pleasure in doing things? No Have you lost interest or pleasure in daily life? No Do you often feel hopeless? No Do you cry easily over simple problems? No   Activities of Daily Living  In your present state of health, do you have any difficulty performing the following activities?:  Driving? No  Managing  money? No  Feeding yourself? No  Getting from bed to chair? No  Climbing a flight of stairs? No  Preparing food and eating?: No  Bathing or showering? No  Getting dressed: No  Getting to the toilet? No  Using the toilet:No  Moving around from place to place: No  In the past year have you fallen or had a near fall?:No  Are you sexually active? No  Do you have more than one partner? No   Hearing Difficulties: No  Do you often ask people to speak up or repeat themselves? No  Do you experience ringing or noises in your ears? No Do you have difficulty understanding soft or whispered voices? No  Do you feel that you have a problem with memory? No Do you often misplace items? No  Do you feel safe at home? Yes  Cognitive Testing  Alert? Yes Normal Appearance?Yes  Oriented to person? Yes Place? Yes  Time? Yes  Recall of three objects? Yes  Can perform simple calculations? Yes  Displays appropriate judgment?Yes  Can read the correct time from a watch face?Yes   Screening Tests / Date Colonoscopy       2012 (q 5 years)              Zostavax UTD Pneumovax 23- UTD Influenza Vaccine UTD Tetanus/tdap 2013 Prevnar 10/16       Review of Systems  All other systems reviewed and are negative.      Objective:   Physical Exam  Constitutional: He is oriented to person, place, and time. He appears well-developed and well-nourished. No distress.  HENT:  Head: Normocephalic and atraumatic.  Right Ear: External ear normal.  Left Ear: External ear normal.  Nose: Nose normal.  Mouth/Throat: Oropharynx is clear and moist. No oropharyngeal exudate.  Eyes: Pupils are equal, round, and reactive to light. Conjunctivae and EOM are normal. Right eye exhibits no discharge. Left eye exhibits no discharge. No scleral icterus.  Neck: Normal range of motion. Neck supple. No JVD present. No tracheal deviation present. No thyromegaly present.  Cardiovascular: Normal rate, regular rhythm, normal heart  sounds and intact distal pulses.  Exam reveals no gallop and no friction rub.   No murmur heard. Pulmonary/Chest: Effort normal and breath sounds normal. No stridor. No respiratory distress. He has no wheezes. He has no rales. He exhibits no tenderness.  Abdominal: Soft. Bowel sounds are normal. He exhibits no distension and no mass. There is no tenderness. There is no rebound and no guarding.  Musculoskeletal: Normal range of motion. He exhibits no edema or tenderness.  Lymphadenopathy:    He has no cervical adenopathy.  Neurological: He is alert and oriented to person, place, and time. He has normal reflexes. No cranial nerve deficit. He exhibits normal muscle tone. Coordination normal.  Skin: Skin is warm. No rash noted. He is not diaphoretic. No erythema. No pallor.  Psychiatric: He has a normal mood and affect. His behavior is normal. Judgment and thought content normal.  Vitals reviewed.         Assessment & Plan:  1. Routine general medical examination at a health care facility Physical exam is completely normal. PSA is excellent. Digital rectal exam was deferred due to colonoscopy this year and normal PSA.. We discussed  Shingrix I recommended that he check on the price of this prior to receiving the vaccine. He is already had zostavax.  Also recommended an annual flu shot. Cholesterol has risen significantly after discontinuing vytorin and replacing with simvastatin.  On Myoview, patient has previous area of infarction that appears to be remote. Therefore I would like his LDL cholesterol to be less than 70. Given his reassuring HDL cholesterol, I will be willing to tolerate an LDL cholesterol less than 100. Therefore we will discontinue simvastatin and replace with Crestor 20 mg by mouth daily. Lab work is excellent. He has mild chronic kidney disease. We will recheck this every 6 months. Avoid NSAIDs and drink plenty of fluids Screen negative for depression.  Medicare Attestation  I  have personally reviewed:  The patient's medical and social history  Their use of alcohol, tobacco or illicit drugs  Their current medications and supplements  The patient's functional ability including ADLs,fall risks, home safety risks, cognitive, and hearing and visual impairment  Diet and physical activities  Evidence for depression or mood disorders  The patient's weight, height, BMI, and visual acuity have been recorded in the chart. I have made referrals, counseling, and provided education to the patient based on review of the above and I have provided the patient with a written personalized care plan for preventive services.

## 2016-11-20 ENCOUNTER — Other Ambulatory Visit: Payer: Self-pay | Admitting: Internal Medicine

## 2016-11-20 NOTE — Telephone Encounter (Signed)
Medication Detail    Disp Refills Start End   rosuvastatin (CRESTOR) 20 MG tablet 90 tablet 3 11/06/2016    Sig - Route: Take 1 tablet (20 mg total) by mouth daily. - Oral   Sent to pharmacy as: rosuvastatin (CRESTOR) 20 MG tablet   E-Prescribing Status: Receipt confirmed by pharmacy (11/06/2016 9:32 AM EDT)   Associated Diagnoses   Pure hypercholesterolemia     Pharmacy   [facility] [place], [place]

## 2016-12-10 ENCOUNTER — Telehealth: Payer: Self-pay | Admitting: Internal Medicine

## 2016-12-10 MED ORDER — APIXABAN 5 MG PO TABS: ORAL_TABLET | ORAL | 1 refills | Status: DC

## 2016-12-10 NOTE — Telephone Encounter (Signed)
New message     *STAT* If patient is at the pharmacy, call can be transferred to refill team.   1. Which medications need to be refilled? (please list name of each medication and dose if known) eliquis 5 mg  2. Which pharmacy/location (including street and city if local pharmacy) is medication to be sent to? [place]   3. Do they need a 30 day or 90 day supply? 90 day

## 2016-12-31 ENCOUNTER — Ambulatory Visit (INDEPENDENT_AMBULATORY_CARE_PROVIDER_SITE_OTHER): Payer: PPO | Admitting: Family Medicine

## 2016-12-31 DIAGNOSIS — Z23 Encounter for immunization: Secondary | ICD-10-CM

## 2017-01-14 ENCOUNTER — Ambulatory Visit (INDEPENDENT_AMBULATORY_CARE_PROVIDER_SITE_OTHER): Payer: PPO | Admitting: Internal Medicine

## 2017-01-14 ENCOUNTER — Encounter: Payer: Self-pay | Admitting: Internal Medicine

## 2017-01-14 VITALS — BP 136/82 | HR 55 | Ht 70.0 in | Wt 170.4 lb

## 2017-01-14 DIAGNOSIS — I10 Essential (primary) hypertension: Secondary | ICD-10-CM

## 2017-01-14 DIAGNOSIS — I422 Other hypertrophic cardiomyopathy: Secondary | ICD-10-CM | POA: Diagnosis not present

## 2017-01-14 DIAGNOSIS — I483 Typical atrial flutter: Secondary | ICD-10-CM | POA: Diagnosis not present

## 2017-01-14 MED ORDER — APIXABAN 5 MG PO TABS: ORAL_TABLET | ORAL | 3 refills | Status: DC

## 2017-01-14 NOTE — Patient Instructions (Signed)
Medication Instructions:  Your physician recommends that you continue on your current medications as directed. Please refer to the Current Medication list given to you today.  -- If you need a refill on your cardiac medications before your next appointment, please call your pharmacy. --  Labwork: None ordered  Testing/Procedures: None ordered  Follow-Up: Your physician wants you to follow-up in: 1 year with Dr. Rayann Heman.  You will receive a reminder letter in the mail two months in advance. If you don't receive a letter, please call our office to schedule the follow-up appointment.  Thank you for choosing [facility]!!   Addis Tuohy, RN [phone]  Any Other Special Instructions Will Be Listed Below (If Applicable).

## 2017-01-14 NOTE — Progress Notes (Signed)
PCP: Susy Frizzle, MD Primary Cardiologist: previously Dr Verl Blalock Primary EP: Dr Kinnie Scales is a 76 y.o. male who presents today for routine electrophysiology followup.  Since last being seen in our clinic, the patient reports doing very well.  He plays golf and does yard work regularly.  Very active for his age with no symptoms.  Today, he denies symptoms of palpitations, chest pain, shortness of breath,  lower extremity edema, dizziness, presyncope, or syncope.  The patient is otherwise without complaint today.   Past Medical History:  Diagnosis Date  . Atrial flutter ([place])    a. s/p CTI ablation x2 by Dr Rayann Heman 10/12, 9/13  . Basal cell carcinoma of cheek   . BPH (benign prostatic hyperplasia)   . CKD (chronic kidney disease) stage 3, GFR 30-59 ml/min ([facility])   . Colon polyps   . Diverticulosis   . Ectopic atrial tachycardia ([place])    a. Dx 07/2015: rate controlled   . Hemorrhoids   . Hyperlipidemia   . Hypertension   . Hypertrophic cardiomyopathy ([place])   . TIA (transient ischemic attack)    Past Surgical History:  Procedure Laterality Date  . ATRIAL ABLATION SURGERY  10/12   CTI ablation for atrial flutter by Dr Rayann Heman  . ATRIAL FLUTTER ABLATION N/A 12/11/2011   CTI ablation for atrial flutter by Dr Rayann Heman  . BASAL CELL CARCINOMA EXCISION  ~ 1973   left cheek  . CARDIAC CATHETERIZATION  1960's  . HERNIA REPAIR  ~ 7673   umbilical  . JOINT REPLACEMENT    . TONSILLECTOMY AND ADENOIDECTOMY  1947  . TOTAL HIP ARTHROPLASTY     left    ROS- all systems are reviewed and negatives except as per HPI above  Current Outpatient Prescriptions  Medication Sig Dispense Refill  . apixaban (ELIQUIS) 5 MG TABS tablet Take 1 tablet by mouth twice a day. Call MD to schedule appt. 180 tablet 1  . Diphenhydramine-APAP, sleep, (TYLENOL PM EXTRA STRENGTH PO) Take 1 tablet by mouth at bedtime as needed.    Marland Kitchen lisinopril (PRINIVIL,ZESTRIL) 10 MG tablet Take 1 tablet (10 mg total)  by mouth daily. 90 tablet 3  . rosuvastatin (CRESTOR) 20 MG tablet Take 1 tablet (20 mg total) by mouth daily. 90 tablet 3   Current Facility-Administered Medications  Medication Dose Route Frequency Provider Last Rate Last Dose  . 0.9 %  sodium chloride infusion  500 mL Intravenous Continuous Pyrtle, Lajuan Lines, MD        Physical Exam: Vitals:   01/14/17 1053  BP: 136/82  Pulse: (!) 55  SpO2: 99%  Weight: 170 lb 6.4 oz (77.3 kg)  Height: 5\' 10"  (1.778 m)    GEN- The patient is well appearing, alert and oriented x 3 today.   Head- normocephalic, atraumatic Eyes-  Sclera clear, conjunctiva pink Ears- hearing intact Oropharynx- clear Lungs- Clear to ausculation bilaterally, normal work of breathing Heart- Regular rate and rhythm, no murmurs, rubs or gallops, PMI not laterally displaced GI- soft, NT, ND, + BS Extremities- no clubbing, cyanosis, or edema  EKG tracing ordered today is personally reviewed and shows sinus rhythm 55 bpm, PR 294 msec, RBBB, LAD, Qtc 430 msec, inferior infarct, unchanged from 07/22/15  Echo 03/05/15 reviewed  Assessment and Plan:  1. H/o atrial flutter Doing great s/p ablation Given severe LA enlargement, I worry about risks of atrial arrhythmias in the future.  He idid have nonsustained atrial tachycardia observed in the  ER 07/2015. Given prior stroke/ TIA, would continue lifelong anticoagulation  2. Hypertrophic CM Echo 03/05/15 is reviewed Preserved EF at that time and no mention of HCM.  No obstruction noted.  Well compensated.  No changes advised.  3. Conduction system disease RBBB/ first degree AV block asymptomatic Caution with AV nodal agents  4. HTN Stable No change required today  5. HL Recently started on crestor by Dr Dennard Schaumann No changes today  Return to see me in a year  Thompson Grayer MD, [facility] 01/14/2017 11:16 AM

## 2017-01-29 ENCOUNTER — Telehealth: Payer: Self-pay | Admitting: *Deleted

## 2017-01-29 NOTE — Telephone Encounter (Signed)
Patient requested a few weeks of eliquis samples. He is aware that I can place two weeks at the front desk. He stated that two weeks should be enough as he already has a stash and he is just trying to make it to the beginning of the year. Patient appreciative for the samples.

## 2017-02-12 ENCOUNTER — Other Ambulatory Visit: Payer: Self-pay | Admitting: Dermatology

## 2017-02-12 DIAGNOSIS — L570 Actinic keratosis: Secondary | ICD-10-CM | POA: Diagnosis not present

## 2017-03-03 ENCOUNTER — Encounter: Payer: Self-pay | Admitting: Family Medicine

## 2017-03-05 ENCOUNTER — Ambulatory Visit (INDEPENDENT_AMBULATORY_CARE_PROVIDER_SITE_OTHER): Payer: PPO

## 2017-03-05 DIAGNOSIS — Z23 Encounter for immunization: Secondary | ICD-10-CM

## 2017-03-05 NOTE — Progress Notes (Signed)
Patient was in office to receive the shingrix vaccine. Patient received vaccine in his left deltoid. Patient tolerated well

## 2017-03-16 ENCOUNTER — Telehealth: Payer: Self-pay | Admitting: Family Medicine

## 2017-03-16 MED ORDER — SIMVASTATIN 40 MG PO TABS: 40.0000 mg | ORAL_TABLET | Freq: Every day | ORAL | 3 refills | Status: DC

## 2017-03-16 NOTE — Telephone Encounter (Signed)
Patient would like rx for simvastatin for 90 day supply sent to [facility] if possible  And also would like to cancel rx for h is roxvastatin because of allergic rxn  [phone]

## 2017-03-16 NOTE — Telephone Encounter (Signed)
Spoke to wife and she stated that he is having increased joint pan especially in hips. Would like to switch back to simvastatin. Med sent to pharm.

## 2017-03-23 ENCOUNTER — Ambulatory Visit (INDEPENDENT_AMBULATORY_CARE_PROVIDER_SITE_OTHER): Payer: PPO

## 2017-03-23 ENCOUNTER — Ambulatory Visit (INDEPENDENT_AMBULATORY_CARE_PROVIDER_SITE_OTHER): Payer: PPO | Admitting: Orthopaedic Surgery

## 2017-03-23 ENCOUNTER — Encounter (INDEPENDENT_AMBULATORY_CARE_PROVIDER_SITE_OTHER): Payer: Self-pay | Admitting: Orthopaedic Surgery

## 2017-03-23 VITALS — BP 131/83 | HR 72 | Resp 14 | Ht 70.0 in | Wt 165.0 lb

## 2017-03-23 DIAGNOSIS — M25552 Pain in left hip: Secondary | ICD-10-CM | POA: Diagnosis not present

## 2017-03-23 DIAGNOSIS — M25551 Pain in right hip: Secondary | ICD-10-CM | POA: Diagnosis not present

## 2017-03-23 NOTE — Progress Notes (Signed)
Office Visit Note   Patient: [name]           Date of Birth: 06/05/1940           MRN: [id number] Visit Date: 03/23/2017              Requested by: Susy Frizzle, MD [address] [place], [place] [address] PCP: Susy Frizzle, MD   Assessment & Plan: Visit Diagnoses:  1. Bilateral hip pain     Plan: Hip pain might be related to his Crestor medicine since stopping he's feeling better. No evidence of wear or complications of either hip replacement. No evidence of the pain is referred from his back. Long discussion regarding the x-ray findings are he'll continue with his excise program and return to see me as needed. He is much better now than he was 3-4 weeks ago while he was taking the Crestor Follow-Up Instructions: Return if symptoms worsen or fail to improve.   Orders:  Orders Placed This Encounter  Procedures  . XR Lumbar Spine 2-3 Views  . XR Pelvis 1-2 Views   No orders of the defined types were placed in this encounter.     Procedures: No procedures performed   Clinical Data: No additional findings.   Subjective: Chief Complaint  Patient presents with  . Right Hip - Pain    [name] is a 77 y o here today for bilateral hip pain. Hx of Bilateral hip replacement. His pain is at the hip "bone" and into the muscles in the front of pelvis. He started spinning class again and in mid-December he noticed a "click" upon standing w/  . Left Hip - Pain  [name] is 77 and 9 years respectively post hip replacements and doing well. Over the past month or so he's developed some pain along the lateral and anterior aspect of both hips. The pain seemed to coincide with the new medicine, Crestor. Since stopping the medicine is much better but wanted to be sure that he wasn't having any problem. No fever or chills. No injury or trauma. He remains physically active no groin pain. No knee discomfort. No swelling distally. He does ride a bike and play golf without  any compromise.  HPI  Review of Systems  Constitutional: Negative for fatigue.  HENT: Negative for hearing loss.   Respiratory: Negative for apnea, chest tightness and shortness of breath.   Cardiovascular: Negative for chest pain, palpitations and leg swelling.  Gastrointestinal: Negative for blood in stool, constipation and diarrhea.  Genitourinary: Negative for difficulty urinating.  Musculoskeletal: Negative for arthralgias, back pain, joint swelling, myalgias, neck pain and neck stiffness.  Neurological: Negative for weakness, numbness and headaches.  Hematological: Does not bruise/bleed easily.  Psychiatric/Behavioral: Negative for sleep disturbance. The patient is not nervous/anxious.      Objective: Vital Signs: BP 131/83   Pulse 72   Resp 14   Ht 5\' 10"  (1.778 m)   Wt 165 lb (74.8 kg)   BMI 23.68 kg/m   Physical Exam  Ortho Exam awake alert and oriented 3. Comfortable sitting pelvis is level. Straight leg raise negative. No pain with range of motion of either hip. Does have some discomfort over the hip flexors. No erythema or edema. No knee pain. Neurovascular exam intact distally. Walks without a limp. No pain over the greater trochanter. No percussible tenderness of the lumbar spine  Specialty Comments:  No specialty comments available.  Imaging: Xr Lumbar Spine  2-3 Views  Result Date: 03/23/2017 Films of lumbar spine obtained in the AP and lateral projection. There is abundant bowel gas which obliterates some of the detail there is some ectopic calcification within the abdominal aorta but without obvious aneurysmal dilatation. There are some degenerative changes at the facet joints at L4-5 and L5-S1 but no evidence of the listhesis. The disc spaces appear to be fairly well maintained. No evidence of compression fracture spine is straight on the AP film  Xr Pelvis 1-2 Views  Result Date: 03/23/2017 AP the pelvis demonstrates 2 previously inserted total hip  replacements. There doesn't appear to be any obvious wear. No ectopic calcification. No obvious loosening. There is a faint sclerotic line along the femoral stem on the left but the patient is asymptomatic    PMFS History: Patient Active Problem List   Diagnosis Date Noted  . Dehydration 07/21/2015  . Ectopic atrial tachycardia ([place])   . Hypotension 07/20/2015  . Fatigue 07/20/2015  . Atrial flutter ([place]) 11/20/2010  . Personal history of colonic polyps 10/28/2010  . Diverticulitis   . Hemorrhoids, internal   . BPH (benign prostatic hypertrophy)   . Hx-TIA (transient ischemic attack)   . Hypertrophic nonobstructive cardiomyopathy ([place]) 08/15/2010  . Hypertension, essential 03/05/2009  . HLD (hyperlipidemia) 03/01/2009   Past Medical History:  Diagnosis Date  . Atrial flutter ([place])    a. s/p CTI ablation x2 by Dr Rayann Heman 10/12, 9/13  . Basal cell carcinoma of cheek   . BPH (benign prostatic hyperplasia)   . CKD (chronic kidney disease) stage 3, GFR 30-59 ml/min (H[facility])   . Colon polyps   . Diverticulosis   . Ectopic atrial tachycardia ([place])    a. Dx 07/2015: rate controlled   . Hemorrhoids   . Hyperlipidemia   . Hypertension   . Hypertrophic cardiomyopathy ([place])   . TIA (transient ischemic attack)     Family History  Problem Relation Age of Onset  . Breast cancer Mother   . Ulcers Father        stomach  . COPD Father   . Tuberculosis Father   . Colon polyps Brother   . Tuberculosis Brother   . Brain cancer Brother   . Colon cancer Neg Hx   . Stomach cancer Neg Hx   . Rectal cancer Neg Hx   . Esophageal cancer Neg Hx   . Liver cancer Neg Hx     Past Surgical History:  Procedure Laterality Date  . ATRIAL ABLATION SURGERY  10/12   CTI ablation for atrial flutter by Dr Rayann Heman  . ATRIAL FLUTTER ABLATION N/A 12/11/2011   CTI ablation for atrial flutter by Dr Rayann Heman  . BASAL CELL CARCINOMA EXCISION  ~ 1973   left cheek  . CARDIAC CATHETERIZATION  1960's  . HERNIA  REPAIR  ~ 1610   umbilical  . JOINT REPLACEMENT    . TONSILLECTOMY AND ADENOIDECTOMY  1947  . TOTAL HIP ARTHROPLASTY     left   Social History   Occupational History  . Occupation: retired  Tobacco Use  . Smoking status: Former Smoker    Packs/day: 2.00    Years: 10.00    Pack years: 20.00    Types: Cigarettes    Last attempt to quit: 03/17/1970    Years since quitting: 47.0  . Smokeless tobacco: Never Used  Substance and Sexual Activity  . Alcohol use: No    Comment: quit 1995  . Drug use: No  . Sexual activity:  Yes

## 2017-04-22 DIAGNOSIS — D229 Melanocytic nevi, unspecified: Secondary | ICD-10-CM | POA: Diagnosis not present

## 2017-04-22 DIAGNOSIS — L578 Other skin changes due to chronic exposure to nonionizing radiation: Secondary | ICD-10-CM | POA: Diagnosis not present

## 2017-04-22 DIAGNOSIS — L814 Other melanin hyperpigmentation: Secondary | ICD-10-CM | POA: Diagnosis not present

## 2017-04-22 DIAGNOSIS — Z85820 Personal history of malignant melanoma of skin: Secondary | ICD-10-CM | POA: Diagnosis not present

## 2017-04-22 DIAGNOSIS — L570 Actinic keratosis: Secondary | ICD-10-CM | POA: Diagnosis not present

## 2017-04-22 DIAGNOSIS — L821 Other seborrheic keratosis: Secondary | ICD-10-CM | POA: Diagnosis not present

## 2017-05-11 ENCOUNTER — Ambulatory Visit (INDEPENDENT_AMBULATORY_CARE_PROVIDER_SITE_OTHER): Payer: PPO | Admitting: *Deleted

## 2017-05-11 ENCOUNTER — Other Ambulatory Visit: Payer: Self-pay

## 2017-05-11 DIAGNOSIS — Z23 Encounter for immunization: Secondary | ICD-10-CM

## 2017-05-13 DIAGNOSIS — L570 Actinic keratosis: Secondary | ICD-10-CM | POA: Diagnosis not present

## 2017-06-05 DIAGNOSIS — H401131 Primary open-angle glaucoma, bilateral, mild stage: Secondary | ICD-10-CM | POA: Diagnosis not present

## 2017-06-05 DIAGNOSIS — H5340 Unspecified visual field defects: Secondary | ICD-10-CM | POA: Diagnosis not present

## 2017-06-10 DIAGNOSIS — L819 Disorder of pigmentation, unspecified: Secondary | ICD-10-CM | POA: Diagnosis not present

## 2017-06-10 DIAGNOSIS — L570 Actinic keratosis: Secondary | ICD-10-CM | POA: Diagnosis not present

## 2017-09-16 ENCOUNTER — Other Ambulatory Visit: Payer: Self-pay | Admitting: *Deleted

## 2017-09-16 ENCOUNTER — Encounter: Payer: Self-pay | Admitting: *Deleted

## 2017-09-16 MED ORDER — LISINOPRIL 10 MG PO TABS: 10.0000 mg | ORAL_TABLET | Freq: Every day | ORAL | 0 refills | Status: DC

## 2017-10-08 ENCOUNTER — Ambulatory Visit (INDEPENDENT_AMBULATORY_CARE_PROVIDER_SITE_OTHER): Payer: PPO | Admitting: Family Medicine

## 2017-10-08 ENCOUNTER — Encounter: Payer: Self-pay | Admitting: Family Medicine

## 2017-10-08 VITALS — BP 130/74 | HR 64 | Temp 97.8°F | Resp 14 | Ht 70.0 in | Wt 172.0 lb

## 2017-10-08 DIAGNOSIS — Z8673 Personal history of transient ischemic attack (TIA), and cerebral infarction without residual deficits: Secondary | ICD-10-CM

## 2017-10-08 DIAGNOSIS — I10 Essential (primary) hypertension: Secondary | ICD-10-CM | POA: Diagnosis not present

## 2017-10-08 DIAGNOSIS — I4892 Unspecified atrial flutter: Secondary | ICD-10-CM

## 2017-10-08 DIAGNOSIS — E7800 Pure hypercholesterolemia, unspecified: Secondary | ICD-10-CM

## 2017-10-08 LAB — CBC WITH DIFFERENTIAL/PLATELET
Basophils Absolute: 50 cells/uL (ref 0–200)
Basophils Relative: 0.7 %
Eosinophils Absolute: 101 cells/uL (ref 15–500)
Eosinophils Relative: 1.4 %
HCT: 43.2 % (ref 38.5–50.0)
Hemoglobin: 14.4 g/dL (ref 13.2–17.1)
Lymphs Abs: 2707 cells/uL (ref 850–3900)
MCH: 30.4 pg (ref 27.0–33.0)
MCHC: 33.3 g/dL (ref 32.0–36.0)
MCV: 91.1 fL (ref 80.0–100.0)
MPV: 11.2 fL (ref 7.5–12.5)
Monocytes Relative: 10.6 %
Neutro Abs: 3578 cells/uL (ref 1500–7800)
Neutrophils Relative %: 49.7 %
Platelets: 194 10*3/uL (ref 140–400)
RBC: 4.74 10*6/uL (ref 4.20–5.80)
RDW: 12.3 % (ref 11.0–15.0)
Total Lymphocyte: 37.6 %
WBC mixed population: 763 cells/uL (ref 200–950)
WBC: 7.2 10*3/uL (ref 3.8–10.8)

## 2017-10-08 LAB — COMPLETE METABOLIC PANEL WITH GFR
AG Ratio: 2.3 (calc) (ref 1.0–2.5)
ALT: 13 U/L (ref 9–46)
AST: 20 U/L (ref 10–35)
Albumin: 4.3 g/dL (ref 3.6–5.1)
Alkaline phosphatase (APISO): 28 U/L — ABNORMAL LOW (ref 40–115)
BUN/Creatinine Ratio: 24 (calc) — ABNORMAL HIGH (ref 6–22)
BUN: 30 mg/dL — ABNORMAL HIGH (ref 7–25)
CO2: 28 mmol/L (ref 20–32)
Calcium: 9.2 mg/dL (ref 8.6–10.3)
Chloride: 104 mmol/L (ref 98–110)
Creat: 1.25 mg/dL — ABNORMAL HIGH (ref 0.70–1.18)
GFR, Est African American: 64 mL/min/{1.73_m2} (ref >=60)
GFR, Est Non African American: 55 mL/min/{1.73_m2} — ABNORMAL LOW (ref >=60)
Globulin: 1.9 g/dL (calc) (ref 1.9–3.7)
Glucose, Bld: 99 mg/dL (ref 65–99)
Potassium: 4.6 mmol/L (ref 3.5–5.3)
Sodium: 137 mmol/L (ref 135–146)
Total Bilirubin: 0.5 mg/dL (ref 0.2–1.2)
Total Protein: 6.2 g/dL (ref 6.1–8.1)

## 2017-10-08 LAB — LIPID PANEL
Cholesterol: 183 mg/dL (ref <200)
HDL: 65 mg/dL (ref >40)
LDL Cholesterol (Calc): 100 mg/dL (calc) — ABNORMAL HIGH
Non-HDL Cholesterol (Calc): 118 mg/dL (calc) (ref <130)
Total CHOL/HDL Ratio: 2.8 (calc) (ref <5.0)
Triglycerides: 87 mg/dL (ref <150)

## 2017-10-08 NOTE — Progress Notes (Signed)
Subjective:    Patient ID: [name], male    DOB: [birth year], 77 y.o.   MRN: [id number]  HPI  At the patient's last visit, we switched him from simvastatin to Crestor because his LDL cholesterol was greater than 70 and the patient has a history of TIA.  He was unable to tolerate the Crestor due to myalgias primarily in his hips and thighs.  After discontinuing Crestor, the myalgias subsided.  He is now tolerating simvastatin without difficulty.  He denies any chest pain shortness of breath or dyspnea on exertion.  He denies any strokelike symptoms.  He denies any current myalgias or right upper quadrant pain.  Review Past Medical/Family/Social:  Past Medical History:  Diagnosis Date  . Atrial flutter ([place])    a. s/p CTI ablation x2 by Dr Rayann Heman 10/12, 9/13  . Basal cell carcinoma of cheek   . BPH (benign prostatic hyperplasia)   . CKD (chronic kidney disease) stage 3, GFR 30-59 ml/min (HCC)   . Colon polyps   . Diverticulosis   . Ectopic atrial tachycardia ([place])    a. Dx 07/2015: rate controlled   . Hemorrhoids   . Hyperlipidemia   . Hypertension   . Hypertrophic cardiomyopathy ([place])   . TIA (transient ischemic attack)    Past Surgical History:  Procedure Laterality Date  . ATRIAL ABLATION SURGERY  10/12   CTI ablation for atrial flutter by Dr Rayann Heman  . ATRIAL FLUTTER ABLATION N/A 12/11/2011   CTI ablation for atrial flutter by Dr Rayann Heman  . BASAL CELL CARCINOMA EXCISION  ~ 1973   left cheek  . CARDIAC CATHETERIZATION  1960's  . HERNIA REPAIR  ~ 8413   umbilical  . JOINT REPLACEMENT    . TONSILLECTOMY AND ADENOIDECTOMY  1947  . TOTAL HIP ARTHROPLASTY     left   Current Outpatient Medications on File Prior to Visit  Medication Sig Dispense Refill  . apixaban (ELIQUIS) 5 MG TABS tablet Take 1 tablet by mouth twice a day 180 tablet 3  . Diphenhydramine-APAP, sleep, (TYLENOL PM EXTRA STRENGTH PO) Take 1 tablet by mouth at bedtime as needed.    Marland Kitchen lisinopril  (PRINIVIL,ZESTRIL) 10 MG tablet Take 1 tablet (10 mg total) by mouth daily. 30 tablet 0  . simvastatin (ZOCOR) 40 MG tablet Take 1 tablet (40 mg total) by mouth at bedtime. 90 tablet 3   Current Facility-Administered Medications on File Prior to Visit  Medication Dose Route Frequency Provider Last Rate Last Dose  . 0.9 %  sodium chloride infusion  500 mL Intravenous Continuous Pyrtle, Lajuan Lines, MD       Allergies  Allergen Reactions  . Crestor [Rosuvastatin Calcium] Other (See Comments)    Joint pain  . Penicillins Other (See Comments)    Unknown reaction; "told I might have allergy 1960's"   Social History   Socioeconomic History  . Marital status: Married    Spouse name: Not on file  . Number of children: 2  . Years of education: Not on file  . Highest education level: Not on file  Occupational History  . Occupation: retired  Scientific laboratory technician  . Financial resource strain: Not on file  . Food insecurity:    Worry: Not on file    Inability: Not on file  . Transportation needs:    Medical: Not on file    Non-medical: Not on file  Tobacco Use  . Smoking status: Former Smoker    Packs/day: 2.00  Years: 10.00    Pack years: 20.00    Types: Cigarettes    Last attempt to quit: 03/17/1970    Years since quitting: 47.5  . Smokeless tobacco: Never Used  Substance and Sexual Activity  . Alcohol use: No    Comment: quit 1995  . Drug use: No  . Sexual activity: Yes  Lifestyle  . Physical activity:    Days per week: Not on file    Minutes per session: Not on file  . Stress: Not on file  Relationships  . Social connections:    Talks on phone: Not on file    Gets together: Not on file    Attends religious service: Not on file    Active member of club or organization: Not on file    Attends meetings of clubs or organizations: Not on file    Relationship status: Not on file  . Intimate partner violence:    Fear of current or ex partner: Not on file    Emotionally abused: Not on  file    Physically abused: Not on file    Forced sexual activity: Not on file  Other Topics Concern  . Not on file  Social History Narrative   Lives in [place] with spouse.  2 Grown children.  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 Breast cancer Mother   . Ulcers Father        stomach  . COPD Father   . Tuberculosis Father   . Colon polyps Brother   . Tuberculosis Brother   . Brain cancer Brother   . Colon cancer Neg Hx   . Stomach cancer Neg Hx   . Rectal cancer Neg Hx   . Esophageal cancer Neg Hx   . Liver cancer Neg Hx       Review of Systems  All other systems reviewed and are negative.      Objective:   Physical Exam  Constitutional: He is oriented to person, place, and time. He appears well-developed and well-nourished. No distress.  HENT:  Head: Normocephalic and atraumatic.  Right Ear: External ear normal.  Left Ear: External ear normal.  Nose: Nose normal.  Mouth/Throat: Oropharynx is clear and moist. No oropharyngeal exudate.  Eyes: Pupils are equal, round, and reactive to light. Conjunctivae and EOM are normal. Right eye exhibits no discharge. Left eye exhibits no discharge. No scleral icterus.  Neck: Normal range of motion. Neck supple. No JVD present. No tracheal deviation present. No thyromegaly present.  Cardiovascular: Normal rate, regular rhythm, normal heart sounds and intact distal pulses. Exam reveals no gallop and no friction rub.  No murmur heard. Pulmonary/Chest: Effort normal and breath sounds normal. No stridor. No respiratory distress. He has no wheezes. He has no rales. He exhibits no tenderness.  Abdominal: Soft. Bowel sounds are normal. He exhibits no distension and no mass. There is no tenderness. There is no rebound and no guarding.  Musculoskeletal: Normal range of motion. He exhibits no edema or tenderness.  Lymphadenopathy:    He has no cervical adenopathy.  Neurological: He is  alert and oriented to person, place, and time. He has normal reflexes. No cranial nerve deficit. He exhibits normal muscle tone. Coordination normal.  Skin: Skin is warm. No rash noted. He is not diaphoretic. No erythema. No pallor.  Psychiatric: He has a normal mood and affect. His behavior is normal. Judgment and thought content normal.  Vitals  reviewed.         Assessment & Plan:  Hx-TIA (transient ischemic attack) - Plan: CBC with Differential/Platelet, COMPLETE METABOLIC PANEL WITH GFR, Lipid panel  Benign essential HTN  Pure hypercholesterolemia  Atrial flutter, unspecified type ([place])  Patient is in normal sinus rhythm today.  He is appropriately anticoagulated on Eliquis.  He denies any chest pain shortness of breath or dyspnea on exertion.  His blood pressures well controlled at 130/74.  He is tolerating simvastatin now without any myalgias ever since December.  However his goal LDL cholesterol be less than 70 given his history of TIA.  If LDL cholesterol is greater than 70, I would recommend switching simvastatin to Lipitor with the hopes that he could tolerate it better and we could try co-Q10 along with it to help prevent myalgias.

## 2017-10-09 ENCOUNTER — Encounter (INDEPENDENT_AMBULATORY_CARE_PROVIDER_SITE_OTHER): Payer: Self-pay

## 2017-10-09 ENCOUNTER — Encounter: Payer: Self-pay | Admitting: Family Medicine

## 2017-10-09 MED ORDER — ATORVASTATIN CALCIUM 40 MG PO TABS: 40.0000 mg | ORAL_TABLET | Freq: Every day | ORAL | 1 refills | Status: DC

## 2017-10-10 ENCOUNTER — Other Ambulatory Visit: Payer: Self-pay | Admitting: Family Medicine

## 2017-10-27 DIAGNOSIS — D1801 Hemangioma of skin and subcutaneous tissue: Secondary | ICD-10-CM | POA: Diagnosis not present

## 2017-10-27 DIAGNOSIS — L819 Disorder of pigmentation, unspecified: Secondary | ICD-10-CM | POA: Diagnosis not present

## 2017-10-27 DIAGNOSIS — L814 Other melanin hyperpigmentation: Secondary | ICD-10-CM | POA: Diagnosis not present

## 2017-10-27 DIAGNOSIS — L570 Actinic keratosis: Secondary | ICD-10-CM | POA: Diagnosis not present

## 2017-10-27 DIAGNOSIS — L821 Other seborrheic keratosis: Secondary | ICD-10-CM | POA: Diagnosis not present

## 2017-10-27 DIAGNOSIS — Z85820 Personal history of malignant melanoma of skin: Secondary | ICD-10-CM | POA: Diagnosis not present

## 2017-10-27 DIAGNOSIS — D229 Melanocytic nevi, unspecified: Secondary | ICD-10-CM | POA: Diagnosis not present

## 2017-12-09 ENCOUNTER — Other Ambulatory Visit: Payer: Self-pay | Admitting: Internal Medicine

## 2017-12-17 ENCOUNTER — Ambulatory Visit (INDEPENDENT_AMBULATORY_CARE_PROVIDER_SITE_OTHER): Payer: PPO

## 2017-12-17 DIAGNOSIS — Z23 Encounter for immunization: Secondary | ICD-10-CM | POA: Diagnosis not present

## 2017-12-17 NOTE — Progress Notes (Signed)
Patient came in today to receive annual flu shot. Patient was given high dose Fluzone vaccine in the right deltoid. Patient tolerated well. VIS was given.

## 2017-12-24 ENCOUNTER — Ambulatory Visit: Payer: PPO

## 2018-01-12 DIAGNOSIS — Z961 Presence of intraocular lens: Secondary | ICD-10-CM | POA: Diagnosis not present

## 2018-01-12 DIAGNOSIS — H401131 Primary open-angle glaucoma, bilateral, mild stage: Secondary | ICD-10-CM | POA: Diagnosis not present

## 2018-02-18 ENCOUNTER — Encounter: Payer: Self-pay | Admitting: Family Medicine

## 2018-02-18 ENCOUNTER — Ambulatory Visit (INDEPENDENT_AMBULATORY_CARE_PROVIDER_SITE_OTHER): Payer: PPO | Admitting: Family Medicine

## 2018-02-18 VITALS — BP 132/70 | HR 66 | Temp 98.0°F | Resp 14 | Ht 70.0 in | Wt 174.0 lb

## 2018-02-18 DIAGNOSIS — J0110 Acute frontal sinusitis, unspecified: Secondary | ICD-10-CM

## 2018-02-18 MED ORDER — LEVOCETIRIZINE DIHYDROCHLORIDE 5 MG PO TABS: 5.0000 mg | ORAL_TABLET | Freq: Every evening | ORAL | 0 refills | Status: DC

## 2018-02-18 MED ORDER — FLUTICASONE PROPIONATE 50 MCG/ACT NA SUSP: 2.0000 | Freq: Every day | NASAL | 6 refills | Status: DC

## 2018-02-18 NOTE — Progress Notes (Signed)
Subjective:    Patient ID: [name], male    DOB: 02-10-1941, 77 y.o.   MRN: [id number]  HPI Patient states that over the last 2 weeks, he has been dealing with pain and pressure over his right eye in his frontal sinus area.  He has not been particularly congested.  He denies any rhinorrhea.  He denies any cough.  He denies any blurry vision.  He denies any fever.  Over the last 3 days, symptoms have resolved.  He jokingly states that he feels foolish being here and even consider canceling his appointment as he feels fine today. Past Medical History:  Diagnosis Date  . Atrial flutter ([place])    a. s/p CTI ablation x2 by Dr Rayann Heman 10/12, 9/13  . Basal cell carcinoma of cheek   . BPH (benign prostatic hyperplasia)   . CKD (chronic kidney disease) stage 3, GFR 30-59 ml/min (HCC)   . Colon polyps   . Diverticulosis   . Ectopic atrial tachycardia ([place])    a. Dx 07/2015: rate controlled   . Hemorrhoids   . Hyperlipidemia   . Hypertension   . Hypertrophic cardiomyopathy ([place])   . TIA (transient ischemic attack)    Past Surgical History:  Procedure Laterality Date  . ATRIAL ABLATION SURGERY  10/12   CTI ablation for atrial flutter by Dr Rayann Heman  . ATRIAL FLUTTER ABLATION N/A 12/11/2011   CTI ablation for atrial flutter by Dr Rayann Heman  . BASAL CELL CARCINOMA EXCISION  ~ 1973   left cheek  . CARDIAC CATHETERIZATION  1960's  . HERNIA REPAIR  ~ 6761   umbilical  . JOINT REPLACEMENT    . TONSILLECTOMY AND ADENOIDECTOMY  1947  . TOTAL HIP ARTHROPLASTY     left   Current Outpatient Medications on File Prior to Visit  Medication Sig Dispense Refill  . atorvastatin (LIPITOR) 40 MG tablet Take 1 tablet (40 mg total) by mouth daily. 90 tablet 1  . co-enzyme Q-10 30 MG capsule Take 30 mg by mouth 3 (three) times daily.    Marland Kitchen ELIQUIS 5 MG TABS tablet TAKE 1 TABLET TWICE A DAY 180 tablet 0  . lisinopril (PRINIVIL,ZESTRIL) 10 MG tablet Take 1 tablet (10 mg total) by mouth daily. 90 tablet 3  .  Melatonin 10 MG TABS Take by mouth.     Current Facility-Administered Medications on File Prior to Visit  Medication Dose Route Frequency Provider Last Rate Last Dose  . 0.9 %  sodium chloride infusion  500 mL Intravenous Continuous Pyrtle, Lajuan Lines, MD       Allergies  Allergen Reactions  . Crestor [Rosuvastatin Calcium] Other (See Comments)    Joint pain  . Penicillins Other (See Comments)    Unknown reaction; "told I might have allergy 1960's"   Social History   Socioeconomic History  . Marital status: Married    Spouse name: Not on file  . Number of children: 2  . Years of education: Not on file  . Highest education level: Not on file  Occupational History  . Occupation: retired  Scientific laboratory technician  . Financial resource strain: Not on file  . Food insecurity:    Worry: Not on file    Inability: Not on file  . Transportation needs:    Medical: Not on file    Non-medical: Not on file  Tobacco Use  . Smoking status: Former Smoker    Packs/day: 2.00    Years: 10.00    Pack years:  20.00    Types: Cigarettes    Last attempt to quit: 03/17/1970    Years since quitting: 47.9  . Smokeless tobacco: Never Used  Substance and Sexual Activity  . Alcohol use: No    Comment: quit 1995  . Drug use: No  . Sexual activity: Yes  Lifestyle  . Physical activity:    Days per week: Not on file    Minutes per session: Not on file  . Stress: Not on file  Relationships  . Social connections:    Talks on phone: Not on file    Gets together: Not on file    Attends religious service: Not on file    Active member of club or organization: Not on file    Attends meetings of clubs or organizations: Not on file    Relationship status: Not on file  . Intimate partner violence:    Fear of current or ex partner: Not on file    Emotionally abused: Not on file    Physically abused: Not on file    Forced sexual activity: Not on file  Other Topics Concern  . Not on file  Social History Narrative    Lives in [place] with spouse.  2 Grown children.  Retired from [employer]      Review of Systems  All other systems reviewed and are negative.      Objective:   Physical Exam  Constitutional: He appears well-developed and well-nourished. No distress.  HENT:  Right Ear: External ear normal.  Left Ear: External ear normal.  Nose: Nose normal. No mucosal edema or rhinorrhea. Right sinus exhibits no maxillary sinus tenderness and no frontal sinus tenderness.  Mouth/Throat: Oropharynx is clear and moist. No oropharyngeal exudate.  Eyes: Conjunctivae, EOM and lids are normal. Right eye exhibits no discharge. Left eye exhibits no discharge. Right conjunctiva is not injected. Right conjunctiva has no hemorrhage. Left conjunctiva is not injected. Left conjunctiva has no hemorrhage.  Cardiovascular: Normal rate, regular rhythm and normal heart sounds.  Pulmonary/Chest: Effort normal and breath sounds normal. No stridor. No respiratory distress. He has no wheezes. He has no rales.  Skin: He is not diaphoretic.  Vitals reviewed.         Assessment & Plan:  Differential diagnosis includes mild sinusitis versus tension headache.  Symptoms have abated at the present time.  Therefore no treatment is necessary.  If symptoms return I have recommended trying Xyzal 5 mg a day with Flonase 2 sprays each nostril daily for possible mild sinusitis

## 2018-02-19 ENCOUNTER — Encounter: Payer: Self-pay | Admitting: Family Medicine

## 2018-02-19 ENCOUNTER — Other Ambulatory Visit: Payer: Self-pay | Admitting: *Deleted

## 2018-02-19 MED ORDER — APIXABAN 5 MG PO TABS: 5.0000 mg | ORAL_TABLET | Freq: Two times a day (BID) | ORAL | 0 refills | Status: DC

## 2018-02-19 NOTE — Telephone Encounter (Signed)
Pt called, LMOM he needs an appt with Dr Rayann Heman last seen 12/2016. Will refill Eliquis 5mg  BID for 30 days with 1 refill. Pt is a 77 yr old male, last noted wt was 78.9Kg on 02/18/18. SCr on 10/08/17 was 1.25.

## 2018-03-12 ENCOUNTER — Other Ambulatory Visit: Payer: Self-pay | Admitting: Family Medicine

## 2018-04-04 ENCOUNTER — Other Ambulatory Visit: Payer: Self-pay | Admitting: Family Medicine

## 2018-04-22 ENCOUNTER — Other Ambulatory Visit: Payer: Self-pay | Admitting: Family Medicine

## 2018-04-29 DIAGNOSIS — L819 Disorder of pigmentation, unspecified: Secondary | ICD-10-CM | POA: Diagnosis not present

## 2018-04-29 DIAGNOSIS — L821 Other seborrheic keratosis: Secondary | ICD-10-CM | POA: Diagnosis not present

## 2018-04-29 DIAGNOSIS — Z85820 Personal history of malignant melanoma of skin: Secondary | ICD-10-CM | POA: Diagnosis not present

## 2018-04-29 DIAGNOSIS — L814 Other melanin hyperpigmentation: Secondary | ICD-10-CM | POA: Diagnosis not present

## 2018-04-29 DIAGNOSIS — L578 Other skin changes due to chronic exposure to nonionizing radiation: Secondary | ICD-10-CM | POA: Diagnosis not present

## 2018-04-29 DIAGNOSIS — L570 Actinic keratosis: Secondary | ICD-10-CM | POA: Diagnosis not present

## 2018-04-29 DIAGNOSIS — D229 Melanocytic nevi, unspecified: Secondary | ICD-10-CM | POA: Diagnosis not present

## 2018-05-10 ENCOUNTER — Ambulatory Visit: Payer: PPO | Admitting: Internal Medicine

## 2018-05-10 ENCOUNTER — Encounter: Payer: Self-pay | Admitting: Internal Medicine

## 2018-05-10 VITALS — BP 122/76 | HR 51 | Ht 70.0 in | Wt 177.4 lb

## 2018-05-10 DIAGNOSIS — R0602 Shortness of breath: Secondary | ICD-10-CM

## 2018-05-10 DIAGNOSIS — I422 Other hypertrophic cardiomyopathy: Secondary | ICD-10-CM

## 2018-05-10 DIAGNOSIS — I10 Essential (primary) hypertension: Secondary | ICD-10-CM

## 2018-05-10 DIAGNOSIS — I483 Typical atrial flutter: Secondary | ICD-10-CM

## 2018-05-10 NOTE — Patient Instructions (Addendum)
Medication Instructions:  Your physician recommends that you continue on your current medications as directed. Please refer to the Current Medication list given to you today.  Labwork: None ordered.  Testing/Procedures: Your physician has requested that you have an echocardiogram. Echocardiography is a painless test that uses sound waves to create images of your heart. It provides your doctor with information about the size and shape of your heart and how well your heart's chambers and valves are working. This procedure takes approximately one hour. There are no restrictions for this procedure.  Please schedule for ECHO with VALSALVA maneuver  Your physician has requested that you have en exercise stress myoview. For further information please visit [email or web address]. Please follow instruction sheet, as given.  Please schedule for exercise myoview   Follow-Up: Your physician wants you to follow-up in:  6 weeks with [name].   Any Other Special Instructions Will Be Listed Below (If Applicable).  If you need a refill on your cardiac medications before your next appointment, please call your pharmacy.

## 2018-05-10 NOTE — Progress Notes (Signed)
PCP: Susy Frizzle, MD Primary Cardiologist: previously Dr Verl Blalock Primary EP: Dr Kinnie Scales is a 78 y.o. male who presents today for routine electrophysiology followup.  Since last being seen in our clinic, the patient reports doing reasonably well.  He has noticed SOB on two occasions over the past 6 weeks.  Both episodes were while working with a chain saw.  He has not been nearly as active as on prior visits due to "the weather".  He has not been riding his bike in quite some time.  Today, he denies symptoms of palpitations, chest pain,  lower extremity edema, dizziness, presyncope, or syncope.  The patient is otherwise without complaint today.   Past Medical History:  Diagnosis Date  . Atrial flutter ([place])    a. s/p CTI ablation x2 by Dr Rayann Heman 10/12, 9/13  . Basal cell carcinoma of cheek   . BPH (benign prostatic hyperplasia)   . CKD (chronic kidney disease) stage 3, GFR 30-59 ml/min (HCC)   . Colon polyps   . Diverticulosis   . Ectopic atrial tachycardia ([place])    a. Dx 07/2015: rate controlled   . Hemorrhoids   . Hyperlipidemia   . Hypertension   . Hypertrophic cardiomyopathy ([place])   . TIA (transient ischemic attack)    Past Surgical History:  Procedure Laterality Date  . ATRIAL ABLATION SURGERY  10/12   CTI ablation for atrial flutter by Dr Rayann Heman  . ATRIAL FLUTTER ABLATION N/A 12/11/2011   CTI ablation for atrial flutter by Dr Rayann Heman  . BASAL CELL CARCINOMA EXCISION  ~ 1973   left cheek  . CARDIAC CATHETERIZATION  1960's  . HERNIA REPAIR  ~ 0347   umbilical  . JOINT REPLACEMENT    . TONSILLECTOMY AND ADENOIDECTOMY  1947  . TOTAL HIP ARTHROPLASTY     left    ROS- all systems are reviewed and negatives except as per HPI above  Current Outpatient Medications  Medication Sig Dispense Refill  . apixaban (ELIQUIS) 5 MG TABS tablet Take 1 tablet (5 mg total) by mouth 2 (two) times daily. 180 tablet 0  . atorvastatin (LIPITOR) 40 MG tablet TAKE 1 TABLET  BY MOUTH EVERY DAY 90 tablet 1  . co-enzyme Q-10 30 MG capsule Take 30 mg by mouth 3 (three) times daily.    . fluticasone (FLONASE) 50 MCG/ACT nasal spray Place 2 sprays into both nostrils daily. 16 g 6  . levocetirizine (XYZAL) 5 MG tablet TAKE 1 TABLET BY MOUTH EVERY DAY IN THE EVENING 90 tablet 3  . lisinopril (PRINIVIL,ZESTRIL) 10 MG tablet Take 1 tablet (10 mg total) by mouth daily. 90 tablet 3  . Melatonin 10 MG TABS Take by mouth.     Current Facility-Administered Medications  Medication Dose Route Frequency Provider Last Rate Last Dose  . 0.9 %  sodium chloride infusion  500 mL Intravenous Continuous Pyrtle, Lajuan Lines, MD        Physical Exam: Vitals:   05/10/18 1410  BP: 122/76  Pulse: (!) 51  SpO2: 99%  Weight: 177 lb 6.4 oz (80.5 kg)  Height: 5\' 10"  (1.778 m)    GEN- The patient is well appearing, alert and oriented x 3 today.   Head- normocephalic, atraumatic Eyes-  Sclera clear, conjunctiva pink Ears- hearing intact Oropharynx- clear Lungs- Clear to ausculation bilaterally, normal work of breathing Heart- Regular rate and rhythm, no murmurs, rubs or gallops, PMI not laterally displaced GI- soft, NT, ND, + BS  Extremities- no clubbing, cyanosis, or edema  Wt Readings from Last 3 Encounters:  05/10/18 177 lb 6.4 oz (80.5 kg)  02/18/18 174 lb (78.9 kg)  10/08/17 172 lb (78 kg)    EKG tracing ordered today is personally reviewed and shows sinus rhythm with first degree AV block, PR 200 msec, RBBB, inferior infract pattern Unchanged from prior ekg  Assessment and Plan:  1. HCM Preserved EF No mention of HCM or obstruction on echo 03/05/15 Follow clinically Given recent SOB, will repeat echo.  Valsalva maneuver to evaluate for dynamic obstruction is requested.  2. Conduction system disease First degree AV block with RBB, appears stable Exercise treadmill to evaluate his heart rate response to activity and make sure that AV conduction remains 1:1 with  activity.  3. H/o atrial flutter He has severe LA enlargement S/p atrial flutter ablation, but may have additional arrhythmias in the future.  Given prior stroke/ TIA continue long term anticoagulation. Avoid AV nodal agents  4. HTN Stable No change required today  5. HL Followed by PCP Labs 7/19 reviewed  6. SOB Unclear etiology He has not been very active recently. Order echo (as above) as well as exercise Myoview to evaluate for AV conduction with activity and also to exclude ischemia. If these studies are low risk, then regular exercise is encouraged  Return to see Richardson Dopp to follow-up on SOB in 6 weeks.  Thompson Grayer MD, [facility] 05/10/2018 2:15 PM

## 2018-05-18 ENCOUNTER — Telehealth (HOSPITAL_COMMUNITY): Payer: Self-pay | Admitting: *Deleted

## 2018-05-18 NOTE — Telephone Encounter (Signed)
Patient given detailed instructions per Myocardial Perfusion Study Information Sheet for the test on 05/21/18. Patient notified to arrive 15 minutes early and that it is imperative to arrive on time for appointment to keep from having the test rescheduled.  If you need to cancel or reschedule your appointment, please call the office within 24 hours of your appointment. . Patient verbalized understanding. Kirstie Peri

## 2018-05-21 ENCOUNTER — Ambulatory Visit (HOSPITAL_BASED_OUTPATIENT_CLINIC_OR_DEPARTMENT_OTHER): Payer: PPO

## 2018-05-21 ENCOUNTER — Ambulatory Visit (HOSPITAL_COMMUNITY): Payer: PPO | Attending: Internal Medicine

## 2018-05-21 DIAGNOSIS — I422 Other hypertrophic cardiomyopathy: Secondary | ICD-10-CM | POA: Diagnosis not present

## 2018-05-21 DIAGNOSIS — R0602 Shortness of breath: Secondary | ICD-10-CM

## 2018-05-21 LAB — MYOCARDIAL PERFUSION IMAGING
Estimated workload: 8.5 METS
Exercise duration (min): 7 min
Exercise duration (sec): 0 s
LV dias vol: 108 mL (ref 62–150)
LV sys vol: 56 mL
MPHR: 142 {beats}/min
Peak HR: 150 {beats}/min
Percent HR: 105 %
Rest HR: 63 {beats}/min
SDS: 10
SRS: 13
SSS: 26
TID: 0.94

## 2018-05-21 LAB — ECHOCARDIOGRAM COMPLETE
Height: 70 in
Weight: 2832 oz

## 2018-05-21 MED ORDER — TECHNETIUM TC 99M TETROFOSMIN IV KIT
10.9000 | PACK | Freq: Once | INTRAVENOUS | Status: AC | PRN
  Administered 2018-05-21: 10.9 via INTRAVENOUS
  Filled 2018-05-21: qty 11

## 2018-05-21 MED ORDER — PERFLUTREN LIPID MICROSPHERE
1.0000 mL | INTRAVENOUS | Status: AC | PRN
  Administered 2018-05-21: 2 mL via INTRAVENOUS

## 2018-05-21 MED ORDER — TECHNETIUM TC 99M TETROFOSMIN IV KIT
31.4000 | PACK | Freq: Once | INTRAVENOUS | Status: AC | PRN
  Administered 2018-05-21: 31.4 via INTRAVENOUS
  Filled 2018-05-21: qty 32

## 2018-05-24 ENCOUNTER — Telehealth: Payer: Self-pay

## 2018-05-24 NOTE — Telephone Encounter (Signed)
I left the patient a message to discuss ECHO/Stress Test results and have him come in on 3/20 @11 :15 to see Scott.

## 2018-05-26 ENCOUNTER — Telehealth: Payer: Self-pay | Admitting: *Deleted

## 2018-05-26 NOTE — Telephone Encounter (Signed)
Spoke with Richardson Dopp PA-C CMA, who is following up on this pt, and working with EP Scheduler Lorenda Hatchet, to have the pt an appt scheduled for next week with Dr Rayann Heman.  This appt is needed to discuss his echo and stress test results, and arrange a cardiac cath at that visit.  Please refer to [facility] results for further documentation of Scott's CMA, who has made the pt aware of this plan.

## 2018-05-26 NOTE — Telephone Encounter (Signed)
SPOKE WITH PT TO SEE IF WOULD LIKE TO COME IN 3-13 EARLIER THAN 3-20 TO SEE WEAVER FOR FOLLOW UP ON ECHO AND SOB.  PT WAS HESITANT DUE TO THE  FACT PT WAS ADAMANT  TO SEE AND  TALK TO DR Rayann Heman TO GET HIS FEEDBACK SINCE HE REQUESTED TEST.  PT AGREED TO SEE WEAVER EARLIER ON THE FACT HE WILL HEAR FROM DR ALLRED ALSO.  WEAVER SPOKE WITH ALLRED WHO SUGGESTED PT CAN JUST COME SEE HIM ON 3-18 SO THAT HE CAN DISCUSS HIS RESULTS DUE TO THE FACT HE WILL BE IN THE OFFICE.  PER ALLRED TO CONTACT SCHEDULER MELISSA TO RESCHEDULE PT TO SEE HIM. MELISSA  WAS CONTACTED.

## 2018-05-26 NOTE — Telephone Encounter (Signed)
-----   Message from Liliane Shi, [place] sent at 05/25/2018  8:23 PM EDT ----- I have an opening on Friday at 9:15 am.  I held it in case the patient can come at that time. Do you want me to arrange a Cardiac Catheterization at that time?  Glean Salen - can you call the patient to see if he can come in on Fri 3/13 at 9:15 am? Richardson Dopp, PA-C    05/25/2018 8:16 PM

## 2018-05-27 ENCOUNTER — Other Ambulatory Visit: Payer: Self-pay

## 2018-05-27 ENCOUNTER — Ambulatory Visit (INDEPENDENT_AMBULATORY_CARE_PROVIDER_SITE_OTHER): Payer: PPO | Admitting: Family Medicine

## 2018-05-27 ENCOUNTER — Encounter: Payer: Self-pay | Admitting: Family Medicine

## 2018-05-27 VITALS — BP 110/64 | HR 66 | Temp 97.9°F | Resp 14 | Ht 70.0 in | Wt 176.0 lb

## 2018-05-27 DIAGNOSIS — R0989 Other specified symptoms and signs involving the circulatory and respiratory systems: Secondary | ICD-10-CM

## 2018-05-27 DIAGNOSIS — R931 Abnormal findings on diagnostic imaging of heart and coronary circulation: Secondary | ICD-10-CM

## 2018-05-27 MED ORDER — METOPROLOL SUCCINATE ER 25 MG PO TB24: 25.0000 mg | ORAL_TABLET | Freq: Every day | ORAL | 3 refills | Status: DC

## 2018-05-27 NOTE — Progress Notes (Signed)
Subjective:    Patient ID: [name], male    DOB: December 28, 1940, 78 y.o.   MRN: [id number]  Patient recently saw his electrophysiologist, Dr. Rayann Heman for follow-up of his atrial flutter status post ablation.  He reported some shortness of breath with activity and therefore Dr. Rayann Heman schedule him for an echocardiogram.  The results of this are dictated below.  There has been a significant drop is in his ejection fraction:  IMPRESSIONS  1. The left ventricle has a visually estimated ejection fraction of. The cavity size was normal. There is mildly increased left ventricular wall thickness. Left ventricular diastolic Doppler parameters are consistent with impaired relaxation.  2. The mitral valve is normal in structure.  3. The tricuspid valve is normal in structure.  4. The aortic valve is tricuspid Mild thickening of the aortic valve Mild calcification of the aortic valve. Aortic valve regurgitation is trivial by color flow Doppler. no stenosis of the aortic valve.  5. LVEF is approximately 35% iwth severe hypokinesis of the inferior, distal lateral and apical distribution. Compared to report from 2016, LVEF is worse.  Patient has apparently had a myocardial perfusion study performed on March 6.  The results of this are dictated below: Large size, severe intensity fixed apical, inferoapical, inferoseptal, septal and anteroseptal perfusion defect suggestive of scar. No significant reversible ischemia. LVEF 48% with inferoseptal, anteroseptal, inferoapical and apical akinesis. This is an intermediate risk study.  I had originally made this appointment prior to determining that the patient had an appointment to see Dr. Rayann Heman.  Patient now states that he has an appointment to see Dr. Rayann Heman next week.  Therefore I hesitate to make any changes.  He is currently on lisinopril 10 mg a day.  He is not on a beta-blocker however this is primarily due to a very low resting heart rate in the low 60s and  relative hypotension with systolic blood pressures around 110.  He states that in January, he was clearing a fallen tree.  He was cutting wood with a chainsaw and he was being extremely physically active.  During this he became very short of breath and had to stop and rest.  That is the only event that is happened.  He denies any angina.  He denies any significant dyspnea on exertion.  He denies any palpitations or syncope or near syncope  Past Medical History:  Diagnosis Date  . Atrial flutter ([place])    a. s/p CTI ablation x2 by Dr Rayann Heman 10/12, 9/13  . Basal cell carcinoma of cheek   . BPH (benign prostatic hyperplasia)   . CKD (chronic kidney disease) stage 3, GFR 30-59 ml/min (HCC)   . Colon polyps   . Diverticulosis   . Ectopic atrial tachycardia ([place])    a. Dx 07/2015: rate controlled   . Hemorrhoids   . Hyperlipidemia   . Hypertension   . Hypertrophic cardiomyopathy ([place])   . TIA (transient ischemic attack)    Past Surgical History:  Procedure Laterality Date  . ATRIAL ABLATION SURGERY  10/12   CTI ablation for atrial flutter by Dr Rayann Heman  . ATRIAL FLUTTER ABLATION N/A 12/11/2011   CTI ablation for atrial flutter by Dr Rayann Heman  . BASAL CELL CARCINOMA EXCISION  ~ 1973   left cheek  . CARDIAC CATHETERIZATION  1960's  . HERNIA REPAIR  ~ 9528   umbilical  . JOINT REPLACEMENT    . TONSILLECTOMY AND ADENOIDECTOMY  1947  . TOTAL HIP ARTHROPLASTY  left   Current Outpatient Medications on File Prior to Visit  Medication Sig Dispense Refill  . apixaban (ELIQUIS) 5 MG TABS tablet Take 1 tablet (5 mg total) by mouth 2 (two) times daily. 180 tablet 0  . atorvastatin (LIPITOR) 40 MG tablet TAKE 1 TABLET BY MOUTH EVERY DAY 90 tablet 1  . co-enzyme Q-10 30 MG capsule Take 30 mg by mouth 3 (three) times daily.    . fluticasone (FLONASE) 50 MCG/ACT nasal spray Place 2 sprays into both nostrils daily. 16 g 6  . levocetirizine (XYZAL) 5 MG tablet TAKE 1 TABLET BY MOUTH EVERY DAY IN THE  EVENING 90 tablet 3  . lisinopril (PRINIVIL,ZESTRIL) 10 MG tablet Take 1 tablet (10 mg total) by mouth daily. 90 tablet 3  . Melatonin 10 MG TABS Take 3 mg by mouth at bedtime.      Current Facility-Administered Medications on File Prior to Visit  Medication Dose Route Frequency Provider Last Rate Last Dose  . 0.9 %  sodium chloride infusion  500 mL Intravenous Continuous [name], Anthony Lines, MD       Allergies  Allergen Reactions  . Crestor [Rosuvastatin Calcium] Other (See Comments)    Joint pain  . Penicillins Other (See Comments)    Unknown reaction; "told I might have allergy 1960's"   Social History   Socioeconomic History  . Marital status: Married    Spouse name: Not on file  . Number of children: 2  . Years of education: Not on file  . Highest education level: Not on file  Occupational History  . Occupation: retired  Scientific laboratory technician  . Financial resource strain: Not on file  . Food insecurity:    Worry: Not on file    Inability: Not on file  . Transportation needs:    Medical: Not on file    Non-medical: Not on file  Tobacco Use  . Smoking status: Former Smoker    Packs/day: 2.00    Years: 10.00    Pack years: 20.00    Types: Cigarettes    Last attempt to quit: 03/17/1970    Years since quitting: 48.2  . Smokeless tobacco: Never Used  Substance and Sexual Activity  . Alcohol use: No    Comment: quit 1995  . Drug use: No  . Sexual activity: Yes  Lifestyle  . Physical activity:    Days per week: Not on file    Minutes per session: Not on file  . Stress: Not on file  Relationships  . Social connections:    Talks on phone: Not on file    Gets together: Not on file    Attends religious service: Not on file    Active member of club or organization: Not on file    Attends meetings of clubs or organizations: Not on file    Relationship status: Not on file  . Intimate partner violence:    Fear of current or ex partner: Not on file    Emotionally abused: Not on file     Physically abused: Not on file    Forced sexual activity: Not on file  Other Topics Concern  . Not on file  Social History Narrative   Lives in [place] with spouse.  2 Grown children.  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 Breast cancer Mother   . Ulcers Father        stomach  . COPD Father   .  Tuberculosis Father   . Colon polyps Brother   . Tuberculosis Brother   . Brain cancer Brother   . Colon cancer Neg Hx   . Stomach cancer Neg Hx   . Rectal cancer Neg Hx   . Esophageal cancer Neg Hx   . Liver cancer Neg Hx       Review of Systems  All other systems reviewed and are negative.      Objective:   Physical Exam  Constitutional: He is oriented to person, place, and time. He appears well-developed and well-nourished. No distress.  HENT:  Head: Normocephalic and atraumatic.  Right Ear: External ear normal.  Left Ear: External ear normal.  Nose: Nose normal.  Mouth/Throat: Oropharynx is clear and moist. No oropharyngeal exudate.  Eyes: Pupils are equal, round, and reactive to light. Conjunctivae and EOM are normal. Right eye exhibits no discharge. Left eye exhibits no discharge. No scleral icterus.  Neck: Normal range of motion. Neck supple. No JVD present. No tracheal deviation present. No thyromegaly present.  Cardiovascular: Normal rate, regular rhythm, normal heart sounds and intact distal pulses. Exam reveals no gallop and no friction rub.  No murmur heard. Pulmonary/Chest: Effort normal and breath sounds normal. No stridor. No respiratory distress. He has no wheezes. He has no rales. He exhibits no tenderness.  Abdominal: Soft. Bowel sounds are normal. He exhibits no distension and no mass. There is no abdominal tenderness. There is no rebound and no guarding.  Musculoskeletal: Normal range of motion.        General: No tenderness or edema.  Lymphadenopathy:    He has no cervical adenopathy.  Neurological:  He is alert and oriented to person, place, and time. He has normal reflexes. No cranial nerve deficit. He exhibits normal muscle tone. Coordination normal.  Skin: Skin is warm. No rash noted. He is not diaphoretic. No erythema. No pallor.  Psychiatric: He has a normal mood and affect. His behavior is normal. Judgment and thought content normal.  Vitals reviewed.         Assessment & Plan:  Depressed left ventricular ejection fraction  I had a long discussion with the patient today.  He is already on an ACE inhibitor.  I would like to start the patient on a low-dose beta-blocker until he can follow-up with his cardiologist.  However his blood pressure today is relatively soft as is his heart rate.  Therefore I recommended starting Toprol-XL 25 mg a day.  However I like him to start just a half a tablet a day.  Of asked the patient to monitor his heart rate and his blood pressure.  If they drop too low we will discontinue the medication.  He will continue lisinopril at the present time.  Spent more than 20 minutes today with the patient reviewing his test and answering his questions.

## 2018-05-31 ENCOUNTER — Other Ambulatory Visit: Payer: Self-pay | Admitting: *Deleted

## 2018-05-31 ENCOUNTER — Encounter: Payer: Self-pay | Admitting: Internal Medicine

## 2018-05-31 DIAGNOSIS — L570 Actinic keratosis: Secondary | ICD-10-CM | POA: Diagnosis not present

## 2018-05-31 MED ORDER — APIXABAN 5 MG PO TABS: 5.0000 mg | ORAL_TABLET | Freq: Two times a day (BID) | ORAL | 3 refills | Status: DC

## 2018-06-01 ENCOUNTER — Telehealth: Payer: Self-pay

## 2018-06-01 NOTE — Telephone Encounter (Signed)
Call placed to Pt.  Advised at this time-routine follow up appointments are being cancelled to reduce possible exposure for our patients.  Pt would like televisit.

## 2018-06-02 ENCOUNTER — Ambulatory Visit: Payer: PPO | Admitting: Internal Medicine

## 2018-06-02 ENCOUNTER — Other Ambulatory Visit: Payer: Self-pay

## 2018-06-02 ENCOUNTER — Telehealth (INDEPENDENT_AMBULATORY_CARE_PROVIDER_SITE_OTHER): Payer: PPO | Admitting: Internal Medicine

## 2018-06-02 DIAGNOSIS — R9439 Abnormal result of other cardiovascular function study: Secondary | ICD-10-CM

## 2018-06-02 DIAGNOSIS — I519 Heart disease, unspecified: Secondary | ICD-10-CM | POA: Diagnosis not present

## 2018-06-02 DIAGNOSIS — I483 Typical atrial flutter: Secondary | ICD-10-CM | POA: Diagnosis not present

## 2018-06-02 DIAGNOSIS — I10 Essential (primary) hypertension: Secondary | ICD-10-CM

## 2018-06-02 DIAGNOSIS — R0602 Shortness of breath: Secondary | ICD-10-CM

## 2018-06-02 NOTE — Progress Notes (Signed)
Electrophysiology TeleHealth Note   Due to national recommendations of social distancing due to [place] 19, Audio/video telehealth visit is felt to be most appropriate for this patient at this time.  See MyChart message from today for the patient's consent to telehealth for [facility].   Date:  06/02/2018   ID:  [name], DOB Jun 21, 1940, MRN [id number]  Location: home  Provider location: [address], [place] Evaluation Performed: Follow-up visit  PCP:  Susy Frizzle, MD  Primary Electrophysiologist: Thompson Grayer, MD    CC: SOB   [name] is a 78 y.o. male who presents via audio conferencing for a telehealth visit today.  Since last being seen in our clinic, the patient reports doing reasonably well. His SOB has not been an issue since I saw him last, though he has not been active, specifically has not used his chain saw.  Today, he denies symptoms of palpitations, chest pain,  lower extremity edema, dizziness, presyncope, or syncope.  The patient is otherwise without complaint today.  The patient denies symptoms of fevers, chills, cough, or new SOB worrisome for COVID 19.   Past Medical History:  Diagnosis Date  . Atrial flutter ([place])    a. s/p CTI ablation x2 by Dr Rayann Heman 10/12, 9/13  . Basal cell carcinoma of cheek   . BPH (benign prostatic hyperplasia)   . CKD (chronic kidney disease) stage 3, GFR 30-59 ml/min (HCC)   . Colon polyps   . Diverticulosis   . Ectopic atrial tachycardia ([place])    a. Dx 07/2015: rate controlled   . Hemorrhoids   . Hyperlipidemia   . Hypertension   . Hypertrophic cardiomyopathy ([place])   . TIA (transient ischemic attack)    Past Surgical History:  Procedure Laterality Date  . ATRIAL ABLATION SURGERY  10/12   CTI ablation for atrial flutter by Dr Rayann Heman  . ATRIAL FLUTTER ABLATION N/A 12/11/2011   CTI ablation for atrial flutter by Dr Rayann Heman  . BASAL CELL CARCINOMA EXCISION  ~ 1973   left cheek  . CARDIAC  CATHETERIZATION  1960's  . HERNIA REPAIR  ~ 3086   umbilical  . JOINT REPLACEMENT    . TONSILLECTOMY AND ADENOIDECTOMY  1947  . TOTAL HIP ARTHROPLASTY     left     Current Outpatient Medications  Medication Sig Dispense Refill  . apixaban (ELIQUIS) 5 MG TABS tablet Take 1 tablet (5 mg total) by mouth 2 (two) times daily. 180 tablet 3  . atorvastatin (LIPITOR) 40 MG tablet TAKE 1 TABLET BY MOUTH EVERY DAY 90 tablet 1  . co-enzyme Q-10 30 MG capsule Take 30 mg by mouth 3 (three) times daily.    . fluticasone (FLONASE) 50 MCG/ACT nasal spray Place 2 sprays into both nostrils daily. 16 g 6  . levocetirizine (XYZAL) 5 MG tablet TAKE 1 TABLET BY MOUTH EVERY DAY IN THE EVENING 90 tablet 3  . lisinopril (PRINIVIL,ZESTRIL) 10 MG tablet Take 1 tablet (10 mg total) by mouth daily. 90 tablet 3  . Melatonin 10 MG TABS Take 3 mg by mouth at bedtime.     . metoprolol succinate (TOPROL-XL) 25 MG 24 hr tablet Take 1 tablet (25 mg total) by mouth daily. 30 tablet 3   Current Facility-Administered Medications  Medication Dose Route Frequency Provider Last Rate Last Dose  . 0.9 %  sodium chloride infusion  500 mL Intravenous Continuous Pyrtle, Lajuan Lines, MD  Allergies:   Crestor [rosuvastatin calcium] and Penicillins   Social History:  The patient  reports that he quit smoking about 48 years ago. His smoking use included cigarettes. He has a 20.00 pack-year smoking history. He has never used smokeless tobacco. He reports that he does not drink alcohol or use drugs.   Family History:  The patient's  family history includes Brain cancer in his brother; Breast cancer in his mother; COPD in his father; Colon polyps in his brother; Tuberculosis in his brother and father; Ulcers in his father.    ROS:  Please see the history of present illness.   All other systems are personally reviewed and negative.    Recent Labs: 10/08/2017: ALT 13; BUN 30; Creat 1.25; Hemoglobin 14.4; Platelets 194; Potassium 4.6;  Sodium 137  personally reviewed   Wt Readings from Last 3 Encounters:  05/27/18 176 lb (79.8 kg)  05/21/18 177 lb (80.3 kg)  05/10/18 177 lb 6.4 oz (80.5 kg)    BP today was 115/68, HR 58  Other studies personally reviewed: Additional studies/ records that were reviewed today include: my recent office note, echo, myoview   Review of the above records today demonstrates:  Recently abnormal myoview and echo revealing reduced EF were discussed with him today.  ASSESSMENT AND PLAN:  1.  SOB Improved His ejection fraction is 35% I suspect that this may be ischemic in nature.  We discussed medical therapy. We will consider cath after COVID 19.  He is aware that should he have symptoms of chest pain or worsening SOB that he should go to the ED.  I did offer sl NTG today which he declines.  2. Systolic dysfunction New diagnosis See above Continue lisinopril and toprol Consider cath after COVID 19 as above.  3. Conduction system disease First degree AV block and RBB is stable Did not have more advanced AV block with treadmill  4. Severe LA enlargement On eliquis given prior atrial flutter, prior stroke and TIA Avoid further AV nodal agents  5. HTN Stable No change required today  6. HL Stable No change required today  7. COVID 19 screen The patient denies symptoms of COVID 19 at this time.  The importance of social distancing was discussed today.  Follow-up:  He and I will follow-up after COVID 19 (tentatively in 6 weeks)  Current medicines are reviewed at length with the patient today.   The patient does not have concerns regarding his medicines.  The following changes were made today:  none   Labs/ tests ordered today include:  No orders of the defined types were placed in this encounter.    Patient Risk:  after full review of this patients clinical status, I feel that they are at at least moderate risk at this time.  Today, I have spent 18 minutes with the patient  with telehealth technology discussing his abnormal myoview, echo, and SOB .    Signed, Thompson Grayer MD, [facility] 06/02/2018 1:29 PM   [place] [address] [phone] (office) [phone] (fax)

## 2018-06-04 ENCOUNTER — Ambulatory Visit: Payer: PPO | Admitting: Physician Assistant

## 2018-06-29 ENCOUNTER — Ambulatory Visit: Payer: PPO | Admitting: Physician Assistant

## 2018-07-12 ENCOUNTER — Telehealth: Payer: Self-pay

## 2018-07-12 NOTE — Telephone Encounter (Signed)
Left message regarding appt on 07/14/18. 

## 2018-07-14 ENCOUNTER — Telehealth (INDEPENDENT_AMBULATORY_CARE_PROVIDER_SITE_OTHER): Payer: PPO | Admitting: Internal Medicine

## 2018-07-14 ENCOUNTER — Encounter: Payer: Self-pay | Admitting: Internal Medicine

## 2018-07-14 VITALS — BP 117/68 | HR 54

## 2018-07-14 DIAGNOSIS — R0602 Shortness of breath: Secondary | ICD-10-CM

## 2018-07-14 DIAGNOSIS — I10 Essential (primary) hypertension: Secondary | ICD-10-CM | POA: Diagnosis not present

## 2018-07-14 DIAGNOSIS — I519 Heart disease, unspecified: Secondary | ICD-10-CM | POA: Diagnosis not present

## 2018-07-14 NOTE — Progress Notes (Signed)
Electrophysiology TeleHealth Note   Due to national recommendations of social distancing due to COVID 19, an audio/video telehealth visit is felt to be most appropriate for this patient at this time.  See MyChart message from today for the patient's consent to telehealth for [facility].   Date:  07/14/2018   ID:  [name], DOB 26-Oct-1940, MRN [id number]  Location: patient's home  Provider location: [address] [place]  Evaluation Performed: Follow-up visit  PCP:  Susy Frizzle, MD   Electrophysiologist:  Dr Rayann Heman  Chief Complaint:  Shortness of breath  History of Present Illness:    [name] is a 78 y.o. male who presents via audio/video conferencing for a telehealth visit today.  Since last being seen in our clinic, the patient reports doing very well. SOB is improved.  Today, he denies symptoms of palpitations, chest pain,   lower extremity edema, dizziness, presyncope, or syncope.  The patient is otherwise without complaint today.  The patient denies symptoms of fevers, chills, cough, or new SOB worrisome for COVID 19.  Past Medical History:  Diagnosis Date  . Atrial flutter ([place])    a. s/p CTI ablation x2 by Dr Rayann Heman 10/12, 9/13  . Basal cell carcinoma of cheek   . BPH (benign prostatic hyperplasia)   . CKD (chronic kidney disease) stage 3, GFR 30-59 ml/min (HCC)   . Colon polyps   . Diverticulosis   . Ectopic atrial tachycardia ([place])    a. Dx 07/2015: rate controlled   . Hemorrhoids   . Hyperlipidemia   . Hypertension   . Hypertrophic cardiomyopathy ([place])   . TIA (transient ischemic attack)     Past Surgical History:  Procedure Laterality Date  . ATRIAL ABLATION SURGERY  10/12   CTI ablation for atrial flutter by Dr Rayann Heman  . ATRIAL FLUTTER ABLATION N/A 12/11/2011   CTI ablation for atrial flutter by Dr Rayann Heman  . BASAL CELL CARCINOMA EXCISION  ~ 1973   left cheek  . CARDIAC CATHETERIZATION  1960's  . HERNIA REPAIR  ~  7672   umbilical  . JOINT REPLACEMENT    . TONSILLECTOMY AND ADENOIDECTOMY  1947  . TOTAL HIP ARTHROPLASTY     left    Current Outpatient Medications  Medication Sig Dispense Refill  . apixaban (ELIQUIS) 5 MG TABS tablet Take 1 tablet (5 mg total) by mouth 2 (two) times daily. 180 tablet 3  . atorvastatin (LIPITOR) 40 MG tablet TAKE 1 TABLET BY MOUTH EVERY DAY 90 tablet 1  . co-enzyme Q-10 30 MG capsule Take 30 mg by mouth 3 (three) times daily.    . fluticasone (FLONASE) 50 MCG/ACT nasal spray Place 2 sprays into both nostrils daily. 16 g 6  . levocetirizine (XYZAL) 5 MG tablet TAKE 1 TABLET BY MOUTH EVERY DAY IN THE EVENING 90 tablet 3  . lisinopril (PRINIVIL,ZESTRIL) 10 MG tablet Take 1 tablet (10 mg total) by mouth daily. 90 tablet 3  . Melatonin 10 MG TABS Take 3 mg by mouth at bedtime.     . metoprolol succinate (TOPROL-XL) 25 MG 24 hr tablet Take 1 tablet (25 mg total) by mouth daily. 30 tablet 3   Current Facility-Administered Medications  Medication Dose Route Frequency Provider Last Rate Last Dose  . 0.9 %  sodium chloride infusion  500 mL Intravenous Continuous Pyrtle, Lajuan Lines, MD        Allergies:   Crestor [rosuvastatin calcium] and Penicillins  Social History:  The patient  reports that he quit smoking about 48 years ago. His smoking use included cigarettes. He has a 20.00 pack-year smoking history. He has never used smokeless tobacco. He reports that he does not drink alcohol or use drugs.   Family History:  The patient's  family history includes Brain cancer in his brother; Breast cancer in his mother; COPD in his father; Colon polyps in his brother; Tuberculosis in his brother and father; Ulcers in his father.   ROS:  Please see the history of present illness.   All other systems are personally reviewed and negative.    Exam:    Vital Signs:  BP 117/68   Pulse (!) 54   Well appearing, alert and conversant, regular work of breathing,  good skin color Eyes-  anicteric, neuro- grossly intact, skin- no apparent rash or lesions or cyanosis, mouth- oral mucosa is pink   Labs/Other Tests and Data Reviewed:    Recent Labs: 10/08/2017: ALT 13; BUN 30; Creat 1.25; Hemoglobin 14.4; Platelets 194; Potassium 4.6; Sodium 137   Wt Readings from Last 3 Encounters:  05/27/18 176 lb (79.8 kg)  05/21/18 177 lb (80.3 kg)  05/10/18 177 lb 6.4 oz (80.5 kg)     Other studies personally reviewed: Additional studies/ records that were reviewed today include: my prior notes, echo amd myoview  Review of the above records today demonstrates: as above Prior radiographs: myoview 05/21/2018- large inferior scar, EF 48% worse from 2015,  Echo 05/21/2018- EF worse when compared to 2016 (35%)  ASSESSMENT & PLAN:    1.  SOB Improved Prior echo and myoview suggest worsening cardiac function I would advise right and left heart cath with possible PCI once elective procedures are being done, hopefully in the next month or so. Discussed the cath with the patient. The patient understands that risks included but are not limited to stroke (1 in 1000), death (1 in 84), kidney failure [usually temporary] (1 in 500), bleeding (1 in 200), allergic reaction [possibly serious] (1 in 200). The patient understands and agrees to proceed.  Would hold eliquis 24 hours prior to the procedure without bridge.  If longer period is required, I would advise lovenox given prior stroke and TIA  2. Chronic systolic dysfunction On appropriate medical therapy Diagnostic cath planned as above  3. Conduction system disease First degre AV block and RBBB are stable  4. Severe LA enlargement On eliquis As above  5. HTN Stable No change required today  6. HL Stable No change required today  7. COVID 19 screen The patient denies symptoms of COVID 19 at this time.  The importance of social distancing was discussed today.  Follow-up:   General cardiology APP after cath  Current medicines  are reviewed at length with the patient today.   The patient does not have concerns regarding his medicines.  The following changes were made today:  none  Labs/ tests ordered today include:  No orders of the defined types were placed in this encounter.  Patient Risk:  after full review of this patients clinical status, I feel that they are at moderate risk at this time.  Today, I have spent 20 minutes with the patient with telehealth technology discussing SOB and abnormal tests with plans for cath .    Army Fossa, MD  07/14/2018 9:13 AM     [facility] [address] [place] [address] [phone] (office) [phone] (fax)

## 2018-07-30 DIAGNOSIS — Z961 Presence of intraocular lens: Secondary | ICD-10-CM | POA: Diagnosis not present

## 2018-07-30 DIAGNOSIS — H401132 Primary open-angle glaucoma, bilateral, moderate stage: Secondary | ICD-10-CM | POA: Diagnosis not present

## 2018-08-03 ENCOUNTER — Telehealth: Payer: Self-pay

## 2018-08-03 DIAGNOSIS — R0602 Shortness of breath: Secondary | ICD-10-CM

## 2018-08-03 DIAGNOSIS — R9439 Abnormal result of other cardiovascular function study: Secondary | ICD-10-CM

## 2018-08-10 NOTE — Telephone Encounter (Signed)
Pt scheduled for r/l heart cath on June 11 at 7:30 am with Dr. Burt Knack  Will schedule Pt for labs and covid test on June 8  Need to clarify Eliquis instructions and complete instruction letter for Pt.  Will cont to monitor.

## 2018-08-13 NOTE — Telephone Encounter (Signed)
-----   Message from Sherren Mocha, MD sent at 08/13/2018  3:09 PM EDT ----- Regarding: RE: holding eliquis prior to cath Yes that's fine ----- Message ----- From: Damian Leavell, RN Sent: 08/13/2018  12:03 PM EDT To: Thompson Grayer, MD, Sherren Mocha, MD Subject: holding eliquis prior to cath                  [facility] Dr. Burt Knack,  Dr. Rayann Heman referred this Pt to you for r/l cath on June 11.    Dr. Rayann Heman was hoping to only hold his Eliquis for 24 hours prior to cath, otherwise he thought he should be bridged.  Is it ok with you to only hold the Eliquis for 24 hours?    Thank you for your time Sonia Baller

## 2018-08-19 ENCOUNTER — Other Ambulatory Visit: Payer: Self-pay | Admitting: Family Medicine

## 2018-08-20 ENCOUNTER — Telehealth: Payer: Self-pay | Admitting: *Deleted

## 2018-08-20 NOTE — Telephone Encounter (Signed)
    COVID-19 Pre-Screening Questions:  . In the past 7 to 10 days have you had a cough,  shortness of breath, headache, congestion, fever (100 or greater) body aches, chills, sore throat, or sudden loss of taste or sense of smell? . Have you been around anyone with known Covid 19. . Have you been around anyone who is awaiting Covid 19 test results in the past 7 to 10 days? . Have you been around anyone who has been exposed to Covid 19, or has mentioned symptoms of Covid 19 within the past 7 to 10 days?  If you have any concerns/questions about symptoms patients report during screening (either on the phone or at threshold). Contact the provider seeing the patient or DOD for further guidance.  If neither are available contact a member of the leadership team.           Contacted patient via telephone got answering machine. Awaiting retutn call. KB

## 2018-08-23 ENCOUNTER — Other Ambulatory Visit: Payer: PPO | Admitting: *Deleted

## 2018-08-23 ENCOUNTER — Other Ambulatory Visit (HOSPITAL_COMMUNITY)
Admission: RE | Admit: 2018-08-23 | Discharge: 2018-08-23 | Disposition: A | Discharge status: Home / Self Care | Payer: PPO | Source: Ambulatory Visit | Attending: Cardiovascular Disease | Admitting: Cardiovascular Disease

## 2018-08-23 ENCOUNTER — Other Ambulatory Visit: Payer: Self-pay

## 2018-08-23 DIAGNOSIS — R0602 Shortness of breath: Secondary | ICD-10-CM | POA: Diagnosis not present

## 2018-08-23 DIAGNOSIS — R9439 Abnormal result of other cardiovascular function study: Secondary | ICD-10-CM | POA: Diagnosis not present

## 2018-08-23 DIAGNOSIS — Z1159 Encounter for screening for other viral diseases: Secondary | ICD-10-CM | POA: Diagnosis not present

## 2018-08-23 LAB — BASIC METABOLIC PANEL
BUN/Creatinine Ratio: 25 — ABNORMAL HIGH (ref 10–24)
BUN: 31 mg/dL — ABNORMAL HIGH (ref 8–27)
CO2: 20 mmol/L (ref 20–29)
Calcium: 9.6 mg/dL (ref 8.6–10.2)
Chloride: 103 mmol/L (ref 96–106)
Creatinine, Ser: 1.25 mg/dL (ref 0.76–1.27)
GFR calc Af Amer: 63 mL/min/{1.73_m2} (ref >59)
GFR calc non Af Amer: 55 mL/min/{1.73_m2} — ABNORMAL LOW (ref >59)
Glucose: 76 mg/dL (ref 65–99)
Potassium: 4.8 mmol/L (ref 3.5–5.2)
Sodium: 138 mmol/L (ref 134–144)

## 2018-08-23 LAB — CBC WITH DIFFERENTIAL/PLATELET
Basophils Absolute: 0.1 10*3/uL (ref 0.0–0.2)
Basos: 1 %
EOS (ABSOLUTE): 0.2 10*3/uL (ref 0.0–0.4)
Eos: 2 %
Hematocrit: 42.9 % (ref 37.5–51.0)
Hemoglobin: 14.2 g/dL (ref 13.0–17.7)
Immature Grans (Abs): 0 10*3/uL (ref 0.0–0.1)
Immature Granulocytes: 0 %
Lymphocytes Absolute: 3 10*3/uL (ref 0.7–3.1)
Lymphs: 37 %
MCH: 30.3 pg (ref 26.6–33.0)
MCHC: 33.1 g/dL (ref 31.5–35.7)
MCV: 92 fL (ref 79–97)
Monocytes Absolute: 0.8 10*3/uL (ref 0.1–0.9)
Monocytes: 11 %
Neutrophils Absolute: 3.9 10*3/uL (ref 1.4–7.0)
Neutrophils: 49 %
Platelets: 203 10*3/uL (ref 150–450)
RBC: 4.69 x10E6/uL (ref 4.14–5.80)
RDW: 12.5 % (ref 11.6–15.4)
WBC: 8 10*3/uL (ref 3.4–10.8)

## 2018-08-24 LAB — NOVEL CORONAVIRUS, NAA (HOSP ORDER, SEND-OUT TO REF LAB; TAT 18-24 HRS): SARS-CoV-2, NAA: NOT DETECTED

## 2018-08-25 ENCOUNTER — Telehealth: Payer: Self-pay | Admitting: *Deleted

## 2018-08-25 NOTE — Telephone Encounter (Addendum)
Pt contacted pre-catheterization scheduled at [facility] for: Thursday August 26, 2018 7:30 AM Verified arrival time and place: [place] Entrance A at: 5:30 AM  Covid-19 test date:08/23/18  No solid food after midnight prior to cath, clear liquids until 5 AM day of procedure. Contrast allergy: no  Hold: Alixaban-last dose AM 08/25/18 until post procedure. Lisinopril-Pm prior to  procedure-GFR 55  Except hold medications AM meds can be  taken pre-cath with sip of water including: ASA 81 mg  Confirm patient has responsible person to drive home post procedure and observe 24 hours after arriving home: yes   Due to Covid-19 pandemic no visitors are allowed in the hospital (unless cognitive impairment).  Their designated party will be called when their procedure is over for an update and to arrange pick up.  Patients are required to wear a mask when they enter the hospital.       COVID-19 Pre-Screening Questions:  . In the past 7 to 10 days have you had a cough,  shortness of breath, headache, congestion, fever (100 or greater) body aches, chills, sore throat, or sudden loss of taste or sense of smell? no . Have you been around anyone with known Covid 19? no . Have you been around anyone who is awaiting Covid 19 test results in the past 7 to 10 days? no . Have you been around anyone who has been exposed to Covid 19, or has mentioned symptoms of Covid 19 within the past 7 to 10 days? no  I reviewed procedure instructions/mask/visitor/Covid-19 screening questions with patient, he verbalized understanding, thanked me for call.

## 2018-08-26 ENCOUNTER — Other Ambulatory Visit: Payer: Self-pay

## 2018-08-26 ENCOUNTER — Encounter (HOSPITAL_COMMUNITY): Payer: Self-pay | Admitting: Cardiovascular Disease

## 2018-08-26 ENCOUNTER — Encounter (HOSPITAL_COMMUNITY)
Admission: RE | Disposition: A | Discharge status: Home / Self Care | Payer: Self-pay | Source: Home / Self Care | Attending: Cardiovascular Disease

## 2018-08-26 ENCOUNTER — Ambulatory Visit (HOSPITAL_COMMUNITY)
Admission: RE | Admit: 2018-08-26 | Discharge: 2018-08-26 | Disposition: A | Discharge status: Home / Self Care | Payer: PPO | Attending: Cardiovascular Disease | Admitting: Cardiovascular Disease

## 2018-08-26 DIAGNOSIS — Z96642 Presence of left artificial hip joint: Secondary | ICD-10-CM | POA: Insufficient documentation

## 2018-08-26 DIAGNOSIS — I422 Other hypertrophic cardiomyopathy: Secondary | ICD-10-CM | POA: Diagnosis not present

## 2018-08-26 DIAGNOSIS — Z7901 Long term (current) use of anticoagulants: Secondary | ICD-10-CM | POA: Diagnosis not present

## 2018-08-26 DIAGNOSIS — N400 Enlarged prostate without lower urinary tract symptoms: Secondary | ICD-10-CM | POA: Insufficient documentation

## 2018-08-26 DIAGNOSIS — I252 Old myocardial infarction: Secondary | ICD-10-CM | POA: Diagnosis not present

## 2018-08-26 DIAGNOSIS — R931 Abnormal findings on diagnostic imaging of heart and coronary circulation: Secondary | ICD-10-CM | POA: Insufficient documentation

## 2018-08-26 DIAGNOSIS — I129 Hypertensive chronic kidney disease with stage 1 through stage 4 chronic kidney disease, or unspecified chronic kidney disease: Secondary | ICD-10-CM | POA: Diagnosis not present

## 2018-08-26 DIAGNOSIS — Z886 Allergy status to analgesic agent status: Secondary | ICD-10-CM | POA: Diagnosis not present

## 2018-08-26 DIAGNOSIS — N183 Chronic kidney disease, stage 3 (moderate): Secondary | ICD-10-CM | POA: Diagnosis not present

## 2018-08-26 DIAGNOSIS — E785 Hyperlipidemia, unspecified: Secondary | ICD-10-CM | POA: Diagnosis not present

## 2018-08-26 DIAGNOSIS — Z7982 Long term (current) use of aspirin: Secondary | ICD-10-CM | POA: Diagnosis not present

## 2018-08-26 DIAGNOSIS — Z79899 Other long term (current) drug therapy: Secondary | ICD-10-CM | POA: Diagnosis not present

## 2018-08-26 DIAGNOSIS — R0602 Shortness of breath: Secondary | ICD-10-CM | POA: Diagnosis not present

## 2018-08-26 DIAGNOSIS — Z880 Allergy status to penicillin: Secondary | ICD-10-CM | POA: Insufficient documentation

## 2018-08-26 DIAGNOSIS — I351 Nonrheumatic aortic (valve) insufficiency: Secondary | ICD-10-CM | POA: Insufficient documentation

## 2018-08-26 DIAGNOSIS — I2510 Atherosclerotic heart disease of native coronary artery without angina pectoris: Secondary | ICD-10-CM | POA: Diagnosis not present

## 2018-08-26 DIAGNOSIS — Z87891 Personal history of nicotine dependence: Secondary | ICD-10-CM | POA: Diagnosis not present

## 2018-08-26 DIAGNOSIS — Z8673 Personal history of transient ischemic attack (TIA), and cerebral infarction without residual deficits: Secondary | ICD-10-CM | POA: Insufficient documentation

## 2018-08-26 HISTORY — PX: RIGHT/LEFT HEART CATH AND CORONARY ANGIOGRAPHY: CATH118266

## 2018-08-26 LAB — POCT I-STAT EG7
Acid-base deficit: 5 mmol/L — ABNORMAL HIGH (ref 0.0–2.0)
Bicarbonate: 20 mmol/L (ref 20.0–28.0)
Calcium, Ion: 1.03 mmol/L — ABNORMAL LOW (ref 1.15–1.40)
HCT: 35 % — ABNORMAL LOW (ref 39.0–52.0)
Hemoglobin: 11.9 g/dL — ABNORMAL LOW (ref 13.0–17.0)
O2 Saturation: 74 %
Potassium: 3.8 mmol/L (ref 3.5–5.1)
Sodium: 144 mmol/L (ref 135–145)
TCO2: 21 mmol/L — ABNORMAL LOW (ref 22–32)
pCO2, Ven: 38 mmHg — ABNORMAL LOW (ref 44.0–60.0)
pH, Ven: 7.33 (ref 7.250–7.430)
pO2, Ven: 42 mmHg (ref 32.0–45.0)

## 2018-08-26 LAB — POCT I-STAT 7, (LYTES, BLD GAS, ICA,H+H)
Acid-base deficit: 4 mmol/L — ABNORMAL HIGH (ref 0.0–2.0)
Bicarbonate: 20.4 mmol/L (ref 20.0–28.0)
Calcium, Ion: 1.2 mmol/L (ref 1.15–1.40)
HCT: 39 % (ref 39.0–52.0)
Hemoglobin: 13.3 g/dL (ref 13.0–17.0)
O2 Saturation: 96 %
Potassium: 4.4 mmol/L (ref 3.5–5.1)
Sodium: 139 mmol/L (ref 135–145)
TCO2: 21 mmol/L — ABNORMAL LOW (ref 22–32)
pCO2 arterial: 34.4 mmHg (ref 32.0–48.0)
pH, Arterial: 7.381 (ref 7.350–7.450)
pO2, Arterial: 86 mmHg (ref 83.0–108.0)

## 2018-08-26 SURGERY — RIGHT/LEFT HEART CATH AND CORONARY ANGIOGRAPHY
Anesthesia: LOCAL

## 2018-08-26 MED ORDER — SODIUM CHLORIDE 0.9% FLUSH: 3.0000 mL | Freq: Two times a day (BID) | INTRAVENOUS | Status: DC

## 2018-08-26 MED ORDER — VERAPAMIL HCL 2.5 MG/ML IV SOLN
INTRAVENOUS | Status: AC
  Filled 2018-08-26: qty 2

## 2018-08-26 MED ORDER — SODIUM CHLORIDE 0.9 % IV SOLN: 250.0000 mL | INTRAVENOUS | Status: DC | PRN

## 2018-08-26 MED ORDER — MIDAZOLAM HCL 2 MG/2ML IJ SOLN
INTRAMUSCULAR | Status: DC | PRN
  Administered 2018-08-26: 2 mg via INTRAVENOUS

## 2018-08-26 MED ORDER — FENTANYL CITRATE (PF) 100 MCG/2ML IJ SOLN
INTRAMUSCULAR | Status: AC
  Filled 2018-08-26: qty 2

## 2018-08-26 MED ORDER — HEPARIN (PORCINE) IN NACL 1000-0.9 UT/500ML-% IV SOLN
INTRAVENOUS | Status: DC | PRN
  Administered 2018-08-26 (×2): 500 mL

## 2018-08-26 MED ORDER — SODIUM CHLORIDE 0.9 % WEIGHT BASED INFUSION: 1.0000 mL/kg/h | INTRAVENOUS | Status: DC

## 2018-08-26 MED ORDER — IOHEXOL 350 MG/ML SOLN
INTRAVENOUS | Status: DC | PRN
  Administered 2018-08-26: 30 mL via INTRACARDIAC

## 2018-08-26 MED ORDER — LIDOCAINE HCL (PF) 1 % IJ SOLN
INTRAMUSCULAR | Status: AC
  Filled 2018-08-26: qty 30

## 2018-08-26 MED ORDER — MIDAZOLAM HCL 2 MG/2ML IJ SOLN
INTRAMUSCULAR | Status: AC
  Filled 2018-08-26: qty 2

## 2018-08-26 MED ORDER — HEPARIN SODIUM (PORCINE) 1000 UNIT/ML IJ SOLN
INTRAMUSCULAR | Status: DC | PRN
  Administered 2018-08-26: 4000 [IU] via INTRAVENOUS

## 2018-08-26 MED ORDER — LIDOCAINE HCL (PF) 1 % IJ SOLN
INTRAMUSCULAR | Status: DC | PRN
  Administered 2018-08-26 (×2): 2 mL via INTRADERMAL

## 2018-08-26 MED ORDER — SODIUM CHLORIDE 0.9% FLUSH: 3.0000 mL | INTRAVENOUS | Status: DC | PRN

## 2018-08-26 MED ORDER — VERAPAMIL HCL 2.5 MG/ML IV SOLN
INTRAVENOUS | Status: DC | PRN
  Administered 2018-08-26: 10 mL via INTRA_ARTERIAL

## 2018-08-26 MED ORDER — ONDANSETRON HCL 4 MG/2ML IJ SOLN: 4.0000 mg | Freq: Four times a day (QID) | INTRAMUSCULAR | Status: DC | PRN

## 2018-08-26 MED ORDER — HYDRALAZINE HCL 20 MG/ML IJ SOLN: 10.0000 mg | INTRAMUSCULAR | Status: DC | PRN

## 2018-08-26 MED ORDER — LABETALOL HCL 5 MG/ML IV SOLN: 10.0000 mg | INTRAVENOUS | Status: DC | PRN

## 2018-08-26 MED ORDER — HEPARIN (PORCINE) IN NACL 1000-0.9 UT/500ML-% IV SOLN
INTRAVENOUS | Status: AC
  Filled 2018-08-26: qty 1000

## 2018-08-26 MED ORDER — FENTANYL CITRATE (PF) 100 MCG/2ML IJ SOLN
INTRAMUSCULAR | Status: DC | PRN
  Administered 2018-08-26: 25 ug via INTRAVENOUS

## 2018-08-26 MED ORDER — ACETAMINOPHEN 325 MG PO TABS: 650.0000 mg | ORAL_TABLET | ORAL | Status: DC | PRN

## 2018-08-26 MED ORDER — ASPIRIN 81 MG PO CHEW: 81.0000 mg | CHEWABLE_TABLET | ORAL | Status: AC

## 2018-08-26 MED ORDER — SODIUM CHLORIDE 0.9 % WEIGHT BASED INFUSION
3.0000 mL/kg/h | INTRAVENOUS | Status: AC
  Administered 2018-08-26: 3 mL/kg/h via INTRAVENOUS

## 2018-08-26 SURGICAL SUPPLY — 11 items

## 2018-08-26 NOTE — Discharge Instructions (Signed)
Radial Site Care ° °This sheet gives you information about how to care for yourself after your procedure. Your health care provider may also give you more specific instructions. If you have problems or questions, contact your health care provider. °What can I expect after the procedure? °After the procedure, it is common to have: °· Bruising and tenderness at the catheter insertion area. °Follow these instructions at home: °Medicines °· Take over-the-counter and prescription medicines only as told by your health care provider. °Insertion site care °· Follow instructions from your health care provider about how to take care of your insertion site. Make sure you: °? Wash your hands with soap and water before you change your bandage (dressing). If soap and water are not available, use hand sanitizer. °? Change your dressing as told by your health care provider. °? Leave stitches (sutures), skin glue, or adhesive strips in place. These skin closures may need to stay in place for 2 weeks or longer. If adhesive strip edges start to loosen and curl up, you may trim the loose edges. Do not remove adhesive strips completely unless your health care provider tells you to do that. °· Check your insertion site every day for signs of infection. Check for: °? Redness, swelling, or pain. °? Fluid or blood. °? Pus or a bad smell. °? Warmth. °· Do not take baths, swim, or use a hot tub until your health care provider approves. °· You may shower 24-48 hours after the procedure, or as directed by your health care provider. °? Remove the dressing and gently wash the site with plain soap and water. °? Pat the area dry with a clean towel. °? Do not rub the site. That could cause bleeding. °· Do not apply powder or lotion to the site. °Activity ° °· For 24 hours after the procedure, or as directed by your health care provider: °? Do not flex or bend the affected arm. °? Do not push or pull heavy objects with the affected arm. °? Do not  drive yourself home from the hospital or clinic. You may drive 24 hours after the procedure unless your health care provider tells you not to. °? Do not operate machinery or power tools. °· Do not lift anything that is heavier than 10 lb (4.5 kg), or the limit that you are told, until your health care provider says that it is safe. °· Ask your health care provider when it is okay to: °? Return to work or school. °? Resume usual physical activities or sports. °? Resume sexual activity. °General instructions °· If the catheter site starts to bleed, raise your arm and put firm pressure on the site. If the bleeding does not stop, get help right away. This is a medical emergency. °· If you went home on the same day as your procedure, a responsible adult should be with you for the first 24 hours after you arrive home. °· Keep all follow-up visits as told by your health care provider. This is important. °Contact a health care provider if: °· You have a fever. °· You have redness, swelling, or yellow drainage around your insertion site. °Get help right away if: °· You have unusual pain at the radial site. °· The catheter insertion area swells very fast. °· The insertion area is bleeding, and the bleeding does not stop when you hold steady pressure on the area. °· Your arm or hand becomes pale, cool, tingly, or numb. °These symptoms may represent a serious problem   that is an emergency. Do not wait to see if the symptoms will go away. Get medical help right away. Call your local emergency services (911 in the [place] Do not drive yourself to the hospital. °Summary °· After the procedure, it is common to have bruising and tenderness at the site. °· Follow instructions from your health care provider about how to take care of your radial site wound. Check the wound every day for signs of infection. °· Do not lift anything that is heavier than 10 lb (4.5 kg), or the limit that you are told, until your health care provider says  that it is safe. °This information is not intended to replace advice given to you by your health care provider. Make sure you discuss any questions you have with your health care provider. °Document Released: 04/05/2010 Document Revised: 04/08/2017 Document Reviewed: 04/08/2017 °[employer] Interactive Patient Education © 2019 [employer] ° °

## 2018-08-26 NOTE — H&P (Signed)
Cardiology Admission History and Physical:   [name] ID: [name] MRN: [id number]; DOB: 1940/05/12   Admission date: 08/26/2018  Primary Care Provider: Susy Frizzle, MD Primary Cardiologist: No primary care provider on file.  Primary Electrophysiologist:  None   Chief Complaint:  Shortness of breath  [name] Profile:   [name] is a 78 y.o. male with history of atrial arrhythmias and previous ablation, abnormal nuclear scan, and shortness of breath, presenting for right and left heart catheterization today.  History of Present Illness:   [name] is a 4 year old gentleman with Anthony above problems who presents today for right and left heart catheterization.He presented earlier this spring with progressive shortness of breath.  He has had no chest pain, orthopnea, PND, or heart palpitations.  Stress Myoview was done and this demonstrated an LVEF of 48%.  There was a large defect of severe severity consistent with prior myocardial infarction in Anthony apical, inferoseptal, and anteroseptal walls.  This was suggestive of scar and interpreted as an intermediate risk study.  Based on his symptoms and abnormal stress test, he is referred for right and left heart catheterization for further evaluation of hemodynamics and coronary anatomy in light of progressive cardiac dysfunction.  He reports no interval changes in his symptoms since he was last seen by Dr. Rayann Heman.   Past Medical History:  Diagnosis Date  . Atrial flutter ([place])    a. s/p CTI ablation x2 by Dr Rayann Heman 10/12, 9/13  . Basal cell carcinoma of cheek   . BPH (benign prostatic hyperplasia)   . CKD (chronic kidney disease) stage 3, GFR 30-59 ml/min (HCC)   . Colon polyps   . Diverticulosis   . Ectopic atrial tachycardia ([place])    a. Dx 07/2015: rate controlled   . Hemorrhoids   . Hyperlipidemia   . Hypertension   . Hypertrophic cardiomyopathy ([place])   . TIA (transient ischemic attack)     Past Surgical History:   Procedure Laterality Date  . ATRIAL ABLATION SURGERY  10/12   CTI ablation for atrial flutter by Dr Rayann Heman  . ATRIAL FLUTTER ABLATION N/A 12/11/2011   CTI ablation for atrial flutter by Dr Rayann Heman  . BASAL CELL CARCINOMA EXCISION  ~ 1973   left cheek  . CARDIAC CATHETERIZATION  1960's  . HERNIA REPAIR  ~ 7106   umbilical  . JOINT REPLACEMENT    . TONSILLECTOMY AND ADENOIDECTOMY  1947  . TOTAL HIP ARTHROPLASTY     left     Medications Prior to Admission: Prior to Admission medications   Medication Sig Start Date End Date Taking? Authorizing Provider  acetaminophen (TYLENOL) 325 MG tablet Take 325 mg by mouth every 6 (six) hours as needed (for pain/headaches).   Yes [provider]  apixaban (ELIQUIS) 5 MG TABS tablet Take 1 tablet (5 mg total) by mouth 2 (two) times daily. 05/31/18  Yes Allred, Jeneen Rinks, MD  aspirin 81 MG chewable tablet Chew 81 mg by mouth once. 08/26/18 08/26/18 Yes [provider]  atorvastatin (LIPITOR) 40 MG tablet TAKE 1 TABLET BY MOUTH EVERY DAY [name] taking differently: Take 40 mg by mouth every evening.  04/05/18  Yes Susy Frizzle, MD  Coenzyme Q10 (COQ10) 200 MG CAPS Take 200 mg by mouth every evening.   Yes [provider]  lisinopril (PRINIVIL,ZESTRIL) 10 MG tablet Take 1 tablet (10 mg total) by mouth daily. [name] taking differently: Take 10 mg by mouth every evening.  10/12/17  Yes Pickard,  Cammie Mcgee, MD  Melatonin 5 MG TABS Take 5 mg by mouth at bedtime.   Yes [provider]  metoprolol succinate (TOPROL-XL) 25 MG 24 hr tablet TAKE 1 TABLET BY MOUTH EVERY DAY [name] taking differently: Take 12.5 mg by mouth daily.  08/19/18  Yes Susy Frizzle, MD  fluticasone (FLONASE) 50 MCG/ACT nasal spray Place 2 sprays into both nostrils daily. [name] not taking: Reported on 08/24/2018 02/18/18   Susy Frizzle, MD  levocetirizine (XYZAL) 5 MG tablet TAKE 1 TABLET BY MOUTH EVERY DAY IN Anthony EVENING [name] not taking: Reported  on 08/24/2018 04/23/18   Susy Frizzle, MD     Allergies:    Allergies  Allergen Reactions  . Nsaids Other (See Comments)    GI  Upset   . Crestor [Rosuvastatin Calcium] Other (See Comments)    Joint pain  . Penicillins Other (See Comments)    Unknown reaction Did it involve swelling of Anthony face/tongue/throat, SOB, or low BP? Unknown Did it involve sudden or severe rash/hives, skin peeling, or any reaction on Anthony inside of your mouth or nose? Unknown Did you need to seek medical attention at a hospital or doctor's office? Unknown When did it last happen? More than 50 years ago If all above answers are "NO", may proceed with cephalosporin use.     Social History:   Social History   Socioeconomic History  . Marital status: Married    Spouse name: Not on file  . Number of children: 2  . Years of education: Not on file  . Highest education level: Not on file  Occupational History  . Occupation: retired  Scientific laboratory technician  . Financial resource strain: Not on file  . Food insecurity    Worry: Not on file    Inability: Not on file  . Transportation needs    Medical: Not on file    Non-medical: Not on file  Tobacco Use  . Smoking status: Former Smoker    Packs/day: 2.00    Years: 10.00    Pack years: 20.00    Types: Cigarettes    Quit date: 03/17/1970    Years since quitting: 48.4  . Smokeless tobacco: Never Used  Substance and Sexual Activity  . Alcohol use: No    Comment: quit 1995  . Drug use: No  . Sexual activity: Yes  Lifestyle  . Physical activity    Days per week: Not on file    Minutes per session: Not on file  . Stress: Not on file  Relationships  . Social Herbalist on phone: Not on file    Gets together: Not on file    Attends religious service: Not on file    Active member of club or organization: Not on file    Attends meetings of clubs or organizations: Not on file    Relationship status: Not on file  . Intimate partner violence    Fear  of current or ex partner: Not on file    Emotionally abused: Not on file    Physically abused: Not on file    Forced sexual activity: Not on file  Other Topics Concern  . Not on file  Social History Narrative   Lives in [place] with spouse.  2 Grown children.  Retired from Data processing manager    Family History:   [name]'s family history includes Brain cancer in his brother; Breast cancer in his mother; COPD in his father;  Colon polyps in his brother; Tuberculosis in his brother and father; Ulcers in his father. There is no history of Colon cancer, Stomach cancer, Rectal cancer, Esophageal cancer, or Liver cancer.    ROS:  Please see Anthony history of present illness.  All other ROS reviewed and negative.     Physical Exam/Data:   Vitals:   08/26/18 0544 08/26/18 0546  BP:  135/85  Pulse: (!) (P) 55 (!) 59  Temp:  97.6 F (36.4 C)  TempSrc:  Oral  SpO2: (P) 100% 100%  Weight: (P) 73.5 kg 73.5 kg  Height: (P) 5\' 10"  (1.778 m) 5\' 9"  (1.753 m)   No intake or output data in Anthony 24 hours ending 08/26/18 0738 Last 3 Weights 08/26/2018 08/26/2018 05/27/2018  Weight (lbs) 162 lb 162 lb 176 lb  Weight (kg) 73.483 kg 73.483 kg 79.833 kg     Body mass index is 23.92 kg/m.  General:  Well nourished, well developed, in no acute distress HEENT: normal Lymph: no adenopathy Neck: no JVD Endocrine:  No thryomegaly Vascular: No carotid bruits; FA pulses 2+ bilaterally Cardiac:  normal S1, S2; RRR; no murmur  Lungs:  clear to auscultation bilaterally, no wheezing, rhonchi or rales  Abd: soft, nontender, no hepatomegaly  Ext: no edema Musculoskeletal:  No deformities, BUE and BLE strength normal and equal Skin: warm and dry  Neuro:  CNs 2-12 intact, no focal abnormalities noted Psych:  Normal affect   Relevant CV Studies: Myocardial perfusion scan 05/21/2018: Study Highlights    Nuclear stress EF: 48%.  Anthony left ventricular ejection fraction is mildly decreased (45-54%).   Blood pressure demonstrated a normal response to exercise.  There was no ST segment deviation noted during stress.  No T wave inversion was noted during stress.  Defect 1: There is a large defect of severe severity.  Findings consistent with prior myocardial infarction.  This is an intermediate risk study.   Large size, severe intensity fixed apical, inferoapical, inferoseptal, septal and anteroseptal perfusion defect suggestive of scar. No significant reversible ischemia. LVEF 48% with inferoseptal, anteroseptal, inferoapical and apical akinesis. This is an intermediate risk study.   Echocardiogram 05/21/2018: IMPRESSIONS    1. Anthony left ventricle has a visually estimated ejection fraction of. Anthony cavity size was normal. There is mildly increased left ventricular wall thickness. Left ventricular diastolic Doppler parameters are consistent with impaired relaxation.  2. Anthony mitral valve is normal in structure.  3. Anthony tricuspid valve is normal in structure.  4. Anthony aortic valve is tricuspid Mild thickening of Anthony aortic valve Mild calcification of Anthony aortic valve. Aortic valve regurgitation is trivial by color flow Doppler. no stenosis of Anthony aortic valve.  5. LVEF is approximately 35% iwth severe hypokinesis of Anthony inferior, distal lateral and apical distribution. Compared to report from 2016, LVEF is worse.  Laboratory Data:  Chemistry Recent Labs  Lab 08/23/18 0758  NA 138  K 4.8  CL 103  CO2 20  GLUCOSE 76  BUN 31*  CREATININE 1.25  CALCIUM 9.6  GFRNONAA 55*  GFRAA 63    No results for input(s): PROT, ALBUMIN, AST, ALT, ALKPHOS, BILITOT in Anthony last 168 hours. Hematology Recent Labs  Lab 08/23/18 0758  WBC 8.0  RBC 4.69  HGB 14.2  HCT 42.9  MCV 92  MCH 30.3  MCHC 33.1  RDW 12.5  PLT 203   Cardiac EnzymesNo results for input(s): TROPONINI in Anthony last 168 hours. No results for input(s): TROPIPOC in Anthony last  168 hours.  BNPNo results for input(s): BNP,  PROBNP in Anthony last 168 hours.  DDimer No results for input(s): DDIMER in Anthony last 168 hours.  Radiology/Studies:  No results found.  Assessment and Plan:   This is a 78 year old male with cardiomyopathy and evidence of previous myocardial infarction who presents with New York Heart Association functional class II symptoms of chronic systolic heart failure.  Because of worsening LVEF and intermediate risk nuclear scan, he is referred for right and left heart catheterization. I have reviewed Anthony risks, indications, and alternatives to cardiac catheterization, possible angioplasty, and stenting with [name]. Risks include but are not limited to bleeding, infection, vascular injury, stroke, myocardial infection, arrhythmia, kidney injury, radiation-related injury in Anthony case of prolonged fluoroscopy use, emergency cardiac surgery, and death. [name] understands Anthony risks of serious complication is 1-2 in 0813 with diagnostic cardiac cath and 1-2% or less with angioplasty/stenting.       For questions or updates, please contact [place] Please consult [email or web address] for contact info under        Signed, Sherren Mocha, MD  08/26/2018 7:38 AM

## 2018-08-26 NOTE — Research (Signed)
  [place] Informed Consent   Subject Name: [name]  Subject met inclusion and exclusion criteria.  The informed consent form, study requirements and expectations were reviewed with the subject and questions and concerns were addressed prior to the signing of the consent form.  The subject verbalized understanding of the trail requirements.  The subject agreed to participate in the [facility] [facility] trial and signed the informed consent.  The informed consent was obtained prior to performance of any protocol-specific procedures for the subject.  A copy of the signed informed consent was given to the subject and a copy was placed in the subject's medical record.  Hedrick,Rayvin Abid W 08/26/2018, 5462

## 2018-09-15 ENCOUNTER — Other Ambulatory Visit: Payer: Self-pay | Admitting: Family Medicine

## 2018-09-24 ENCOUNTER — Ambulatory Visit: Payer: PPO | Admitting: Physician Assistant

## 2018-10-01 ENCOUNTER — Telehealth: Payer: Self-pay | Admitting: *Deleted

## 2018-10-01 NOTE — Telephone Encounter (Signed)
   Spoke with wife who rescheduled husband appointment and was able to anser his screen questions   COVID-19 Pre-Screening Questions:  . In the past 7 to 10 days have you had a cough,  shortness of breath, headache, congestion, fever (100 or greater) body aches, chills, sore throat, or sudden loss of taste or sense of smell? NO  . Have you been around anyone with known Covid 19. NO  . Have you been around anyone who is awaiting Covid 19 test results in the past 7 to 10 days? NO  . Have you been around anyone who has been exposed to Covid 19, or has mentioned symptoms of Covid 19 within the past 7 to 10 days? NO   If you have any concerns/questions about symptoms patients report during screening (either on the phone or at threshold). Contact the provider seeing the patient or DOD for further guidance.  If neither are available contact a member of the leadership team.

## 2018-10-05 ENCOUNTER — Ambulatory Visit: Payer: PPO | Admitting: Physician Assistant

## 2018-10-05 DIAGNOSIS — I5022 Chronic systolic (congestive) heart failure: Secondary | ICD-10-CM | POA: Insufficient documentation

## 2018-10-05 NOTE — Progress Notes (Deleted)
Cardiology Office Note:    Date:  10/05/2018   ID:  [name], DOB 09-04-1940, MRN [id number]  PCP:  Susy Frizzle, MD  Cardiologist:  No primary care provider on file. *** Electrophysiologist:  None   Referring MD: Susy Frizzle, MD   No chief complaint on file. ***  History of Present Illness:    [name] is a 78 y.o. male with ***  Chronic systolic CHF  Echocardiogram 05/2018: EF 35, Gr 1 DD  Coronary artery disease   LHC 08/2018:  Mild to mod non-obs CAD except for 80% stenosis in a very small D3 >> Med Rx  Hypertrophic non-obstructive cardiomyopathy  Hx of echocardiogram in past with mild asymmetric septal hypertrophy, no LVOT gradient  Recurrent Atrial flutter  S/p prior RFCA in 2012 and 2013  CHADS2-VASc=7 (CAD, CHF, HTN, CVA, age x 2) >> Eliquis   Ectopic Atrial Tachycardia  Conduction system disease  1st degree AVB, LAFB, RBBB  Hx of TIA in 2015  Hypertension   Hyperlipidemia  Chronic kidney disease 3  [name] was evaluated by Dr. Rayann Heman in Feb 2020 for shortness of breath.  A follow up echocardiogram demonstrated reduced LVF with EF 35% and a Myoview demonstrated inferoseptal and anteroseptal scar.  Cardiac Catheterization was recommended, but this was delayed due to COVID-19 restrictions.  He was ultimately set up for Cardiac Catheterization 08/26/2018 which demonstrated mainly non-obstructive disease except for severe stenosis in the ostium of a small distal diagonal branch (too small for PCI).  There was also normal intracardiac hemodynamics in both the right and left heart with normal/low intracardiac filling pressures suggestive of well compensated chronic systolic CHF.  He returns for follow up.  ***  Prior CV studies:   The following studies were reviewed today:  *** R/L Cardiac Catheterization 08/26/2018 LAD mid 30; D3 (too small for PCI) 80 RI irregs LCx irregs RCA prox 50, mid 30 LVEDP 12  Myoview 05/21/2018 Large  size, severe intensity fixed apical, inferoapical, inferoseptal, septal and anteroseptal perfusion defect suggestive of scar. No significant reversible ischemia. LVEF 48% with inferoseptal, anteroseptal, inferoapical and apical akinesis. This is an intermediate risk study.  Echocardiogram 05/21/2018 EF 35, severe inf/dist lat/apical HK, mild LVH, Gr 1 DD, trivial AI  Echocardiogram 03/05/15 Mild LVH, EF 50-55, ant-sept HK, inf-sept HK, Gr 2 DD, trivial AI, mild MR, severe LAE, mild RAE, mild PI  Limited echocardiogram with contrast 12/04/13 EF 50-55 with septal HK  Echocardiogram 12/03/13 EF 25-30  Carotid US 12/03/13 Bilateral ICA 1-39  Echocardiogram 05/23/13 Mild asymmetric septal hypertrophy, EF 34-28, mod diastolic dysfunction, no LVOT gradient, no SAM  Past Medical History:  Diagnosis Date  . Atrial flutter ([place])    a. s/p CTI ablation x2 by Dr Rayann Heman 10/12, 9/13  . Basal cell carcinoma of cheek   . BPH (benign prostatic hyperplasia)   . CKD (chronic kidney disease) stage 3, GFR 30-59 ml/min (HCC)   . Colon polyps   . Diverticulosis   . Ectopic atrial tachycardia ([place])    a. Dx 07/2015: rate controlled   . Hemorrhoids   . Hyperlipidemia   . Hypertension   . Hypertrophic cardiomyopathy ([place])   . TIA (transient ischemic attack)    Surgical Hx: The patient  has a past surgical history that includes Excision basal cell carcinoma (~ 1973); Total hip arthroplasty; Atrial ablation surgery (10/12); Cardiac catheterization (1960's); Tonsillectomy and adenoidectomy (1947); Joint replacement; Hernia repair (~ 1972); Atrial flutter ablation (N/A,  12/11/2011); and RIGHT/LEFT HEART CATH AND CORONARY ANGIOGRAPHY (N/A, 08/26/2018).   Current Medications: No outpatient medications have been marked as taking for the 10/05/18 encounter (Appointment) with Richardson Dopp T, PA-C.   Current Facility-Administered Medications for the 10/05/18 encounter (Appointment) with Liliane Shi, PA-C   Medication  . 0.9 %  sodium chloride infusion     Allergies:   Nsaids, Crestor [rosuvastatin calcium], and Penicillins   Social History   Tobacco Use  . Smoking status: Former Smoker    Packs/day: 2.00    Years: 10.00    Pack years: 20.00    Types: Cigarettes    Quit date: 03/17/1970    Years since quitting: 48.5  . Smokeless tobacco: Never Used  Substance Use Topics  . Alcohol use: No    Comment: quit 1995  . Drug use: No     Family Hx: The patient's family history includes Brain cancer in his brother; Breast cancer in his mother; COPD in his father; Colon polyps in his brother; Tuberculosis in his brother and father; Ulcers in his father. There is no history of Colon cancer, Stomach cancer, Rectal cancer, Esophageal cancer, or Liver cancer.  ROS:   Please see the history of present illness.    ROS All other systems reviewed and are negative.   EKGs/Labs/Other Test Reviewed:    EKG:  EKG is *** ordered today.  The ekg ordered today demonstrates ***  Recent Labs: 10/08/2017: ALT 13 08/23/2018: BUN 31; Creatinine, Ser 1.25; Platelets 203 08/26/2018: Hemoglobin 11.9; Hemoglobin 13.3; Potassium 3.8; Potassium 4.4; Sodium 144; Sodium 139   Recent Lipid Panel Lab Results  Component Value Date/Time   CHOL 183 10/08/2017 08:17 AM   TRIG 87 10/08/2017 08:17 AM   HDL 65 10/08/2017 08:17 AM   CHOLHDL 2.8 10/08/2017 08:17 AM   LDLCALC 100 (H) 10/08/2017 08:17 AM    Physical Exam:    VS:  There were no vitals taken for this visit.    Wt Readings from Last 3 Encounters:  08/26/18 162 lb (73.5 kg)  05/27/18 176 lb (79.8 kg)  05/21/18 177 lb (80.3 kg)     ***Physical Exam  ASSESSMENT & PLAN:    No diagnosis found.***  Dispo:  No follow-ups on file.   Medication Adjustments/Labs and Tests Ordered: Current medicines are reviewed at length with the patient today.  Concerns regarding medicines are outlined above.  Tests Ordered: No orders of the defined types were  placed in this encounter.  Medication Changes: No orders of the defined types were placed in this encounter.   Signed, Richardson Dopp, PA-C  10/05/2018 8:22 AM    [facility] [address], [place], [place]  [address] Phone: [phone]; Fax: [phone]

## 2018-10-06 ENCOUNTER — Telehealth: Payer: PPO | Admitting: Physician Assistant

## 2018-10-06 NOTE — Telephone Encounter (Signed)
Please contact the patient to reschedule his appointment. Richardson Dopp, PA-C    10/06/2018 1:55 PM

## 2018-10-25 ENCOUNTER — Other Ambulatory Visit: Payer: Self-pay | Admitting: Family Medicine

## 2018-12-02 ENCOUNTER — Other Ambulatory Visit: Payer: Self-pay

## 2018-12-02 ENCOUNTER — Ambulatory Visit (INDEPENDENT_AMBULATORY_CARE_PROVIDER_SITE_OTHER): Payer: PPO | Admitting: *Deleted

## 2018-12-02 DIAGNOSIS — Z23 Encounter for immunization: Secondary | ICD-10-CM | POA: Diagnosis not present

## 2018-12-02 NOTE — Progress Notes (Signed)
Patient seen in office for Influenza Vaccination.   Tolerated IM administration well.   Immunization history updated.  

## 2019-01-13 ENCOUNTER — Encounter: Payer: Self-pay | Admitting: Family Medicine

## 2019-01-13 MED ORDER — LISINOPRIL 10 MG PO TABS: ORAL_TABLET | ORAL | 1 refills | Status: DC

## 2019-01-13 MED ORDER — ATORVASTATIN CALCIUM 40 MG PO TABS: 40.0000 mg | ORAL_TABLET | Freq: Every day | ORAL | 1 refills | Status: DC

## 2019-01-18 ENCOUNTER — Other Ambulatory Visit: Payer: PPO

## 2019-01-18 ENCOUNTER — Other Ambulatory Visit: Payer: Self-pay

## 2019-01-18 DIAGNOSIS — Z0000 Encounter for general adult medical examination without abnormal findings: Secondary | ICD-10-CM

## 2019-01-19 ENCOUNTER — Other Ambulatory Visit: Payer: Self-pay

## 2019-01-19 LAB — LIPID PANEL
Cholesterol: 183 mg/dL (ref <200)
HDL: 66 mg/dL (ref >=40)
LDL Cholesterol (Calc): 101 mg/dL (calc) — ABNORMAL HIGH
Non-HDL Cholesterol (Calc): 117 mg/dL (calc) (ref <130)
Total CHOL/HDL Ratio: 2.8 (calc) (ref <5.0)
Triglycerides: 71 mg/dL (ref <150)

## 2019-01-19 LAB — CBC WITH DIFFERENTIAL/PLATELET
Absolute Monocytes: 719 cells/uL (ref 200–950)
Basophils Absolute: 40 cells/uL (ref 0–200)
Basophils Relative: 0.5 %
Eosinophils Absolute: 126 cells/uL (ref 15–500)
Eosinophils Relative: 1.6 %
HCT: 44.6 % (ref 38.5–50.0)
Hemoglobin: 14.9 g/dL (ref 13.2–17.1)
Lymphs Abs: 2868 cells/uL (ref 850–3900)
MCH: 30.7 pg (ref 27.0–33.0)
MCHC: 33.4 g/dL (ref 32.0–36.0)
MCV: 92 fL (ref 80.0–100.0)
MPV: 11.7 fL (ref 7.5–12.5)
Monocytes Relative: 9.1 %
Neutro Abs: 4148 cells/uL (ref 1500–7800)
Neutrophils Relative %: 52.5 %
Platelets: 195 10*3/uL (ref 140–400)
RBC: 4.85 10*6/uL (ref 4.20–5.80)
RDW: 12.7 % (ref 11.0–15.0)
Total Lymphocyte: 36.3 %
WBC: 7.9 10*3/uL (ref 3.8–10.8)

## 2019-01-19 LAB — COMPREHENSIVE METABOLIC PANEL
AG Ratio: 2 (calc) (ref 1.0–2.5)
ALT: 21 U/L (ref 9–46)
AST: 22 U/L (ref 10–35)
Albumin: 4.2 g/dL (ref 3.6–5.1)
Alkaline phosphatase (APISO): 27 U/L — ABNORMAL LOW (ref 35–144)
BUN/Creatinine Ratio: 21 (calc) (ref 6–22)
BUN: 26 mg/dL — ABNORMAL HIGH (ref 7–25)
CO2: 28 mmol/L (ref 20–32)
Calcium: 9.5 mg/dL (ref 8.6–10.3)
Chloride: 107 mmol/L (ref 98–110)
Creat: 1.25 mg/dL — ABNORMAL HIGH (ref 0.70–1.18)
Globulin: 2.1 g/dL (calc) (ref 1.9–3.7)
Glucose, Bld: 100 mg/dL — ABNORMAL HIGH (ref 65–99)
Potassium: 5.4 mmol/L — ABNORMAL HIGH (ref 3.5–5.3)
Sodium: 142 mmol/L (ref 135–146)
Total Bilirubin: 0.7 mg/dL (ref 0.2–1.2)
Total Protein: 6.3 g/dL (ref 6.1–8.1)

## 2019-01-19 LAB — PSA: PSA: 2.6 ng/mL (ref <=4.0)

## 2019-01-20 ENCOUNTER — Encounter: Payer: Self-pay | Admitting: Family Medicine

## 2019-01-20 ENCOUNTER — Ambulatory Visit (INDEPENDENT_AMBULATORY_CARE_PROVIDER_SITE_OTHER): Payer: PPO | Admitting: Family Medicine

## 2019-01-20 VITALS — BP 140/80 | HR 54 | Temp 97.9°F | Resp 14 | Ht 70.0 in | Wt 172.0 lb

## 2019-01-20 DIAGNOSIS — Z8673 Personal history of transient ischemic attack (TIA), and cerebral infarction without residual deficits: Secondary | ICD-10-CM

## 2019-01-20 DIAGNOSIS — I10 Essential (primary) hypertension: Secondary | ICD-10-CM | POA: Diagnosis not present

## 2019-01-20 DIAGNOSIS — E7800 Pure hypercholesterolemia, unspecified: Secondary | ICD-10-CM

## 2019-01-20 DIAGNOSIS — E875 Hyperkalemia: Secondary | ICD-10-CM

## 2019-01-20 DIAGNOSIS — I4892 Unspecified atrial flutter: Secondary | ICD-10-CM

## 2019-01-20 DIAGNOSIS — I5020 Unspecified systolic (congestive) heart failure: Secondary | ICD-10-CM

## 2019-01-20 DIAGNOSIS — Z0001 Encounter for general adult medical examination with abnormal findings: Secondary | ICD-10-CM | POA: Diagnosis not present

## 2019-01-20 DIAGNOSIS — I509 Heart failure, unspecified: Secondary | ICD-10-CM | POA: Insufficient documentation

## 2019-01-20 DIAGNOSIS — Z86010 Personal history of colonic polyps: Secondary | ICD-10-CM

## 2019-01-20 DIAGNOSIS — Z0000 Encounter for general adult medical examination without abnormal findings: Secondary | ICD-10-CM

## 2019-01-20 DIAGNOSIS — I2510 Atherosclerotic heart disease of native coronary artery without angina pectoris: Secondary | ICD-10-CM | POA: Diagnosis not present

## 2019-01-20 MED ORDER — EZETIMIBE 10 MG PO TABS: 10.0000 mg | ORAL_TABLET | Freq: Every day | ORAL | 3 refills | Status: DC

## 2019-01-20 MED ORDER — REPATHA 140 MG/ML ~~LOC~~ SOSY: 140.0000 mg | PREFILLED_SYRINGE | SUBCUTANEOUS | 5 refills | Status: DC

## 2019-01-20 NOTE — Progress Notes (Signed)
Subjective:    Patient ID: [name], male    DOB: 07-Jul-1940, 78 y.o.   MRN: [id number]    Here today for complete physical exam.  Had catheterization August 26, 2018.  Results are included below: 1.  Nonobstructive coronary artery disease with patency of the left main stem, patency of the LAD with minor nonobstructive disease, patency of the circumflex/intermediate, severe stenosis at the ostium of the small distal diagonal branch, and mild nonobstructive disease of a large, dominant RCA 2.  Normal intracardiac hemodynamics in both the right and left heart with normal/low intracardiac filling pressures suggestive of well compensated chronic systolic heart failure  Echo March 2020 revealed: LVEF is approximately 35% with severe hypokinesis of the inferior, distal lateral and apical distribution.  Last colonoscopy was March 2018  Patient states that he is doing well.  He denies any further chest pain or shortness of breath or dyspnea on exertion.  He is on 40 mg of Lipitor however his LDL cholesterol remains a above his goal at 101.  He denies any myalgias however he was unable to tolerate Crestor in the past.  There is been no incidence of falls.  There is no evidence of depression or memory loss.  His immunizations as shown below are up-to-date.  His lab work was significant only for an elevated LDL cholesterol as well as a mild elevation in his potassium.  He also has known stable chronic kidney disease that has not changed. Immunization History  Administered Date(s) Administered  . Fluad Quad(high Dose 65+) 12/02/2018  . Hepatitis A 12/06/1997, 09/14/1998  . Hepatitis B 12/06/1997, 01/05/1998, 09/07/1998  . Influenza, High Dose Seasonal PF 12/31/2016, 12/17/2017  . Influenza,inj,Quad PF,6+ Mos 12/28/2012, 01/03/2014, 12/27/2014, 12/18/2015  . Pneumococcal Conjugate-13 12/27/2014  . Pneumococcal-Unspecified 11/16/2010  . Tdap 08/16/2011  . Zoster 12/31/2010  . Zoster Recombinat  (Shingrix) 03/05/2017, 05/11/2017    Past Medical History:  Diagnosis Date  . Atrial flutter ([place])    a. s/p CTI ablation x2 by Dr Rayann Heman 10/12, 9/13  . Basal cell carcinoma of cheek   . BPH (benign prostatic hyperplasia)   . CKD (chronic kidney disease) stage 3, GFR 30-59 ml/min (HCC)   . Colon polyps   . Diverticulosis   . Ectopic atrial tachycardia ([place])    a. Dx 07/2015: rate controlled   . Hemorrhoids   . Hyperlipidemia   . Hypertension   . Hypertrophic cardiomyopathy ([place])   . TIA (transient ischemic attack)    Past Surgical History:  Procedure Laterality Date  . ATRIAL ABLATION SURGERY  10/12   CTI ablation for atrial flutter by Dr Rayann Heman  . ATRIAL FLUTTER ABLATION N/A 12/11/2011   CTI ablation for atrial flutter by Dr Rayann Heman  . BASAL CELL CARCINOMA EXCISION  ~ 1973   left cheek  . CARDIAC CATHETERIZATION  1960's  . HERNIA REPAIR  ~ [id number]   umbilical  . JOINT REPLACEMENT    . RIGHT/LEFT HEART CATH AND CORONARY ANGIOGRAPHY N/A 08/26/2018   Procedure: RIGHT/LEFT HEART CATH AND CORONARY ANGIOGRAPHY;  Surgeon: Sherren Mocha, MD;  Location: [facility];  Service: Cardiovascular;  Laterality: N/A;  . TONSILLECTOMY AND ADENOIDECTOMY  1947  . TOTAL HIP ARTHROPLASTY     left   Current Outpatient Medications on File Prior to Visit  Medication Sig Dispense Refill  . acetaminophen (TYLENOL) 325 MG tablet Take 325 mg by mouth every 6 (six) hours as needed (for pain/headaches).    Marland Kitchen apixaban (ELIQUIS)  5 MG TABS tablet Take 1 tablet (5 mg total) by mouth 2 (two) times daily. 180 tablet 3  . atorvastatin (LIPITOR) 40 MG tablet Take 1 tablet (40 mg total) by mouth daily. 90 tablet 1  . Coenzyme Q10 (COQ10) 200 MG CAPS Take 200 mg by mouth every evening.    Marland Kitchen lisinopril (ZESTRIL) 10 MG tablet TAKE 1 TABLET BY MOUTH EVERY DAY IN THE EVENING 90 tablet 1  . Melatonin 5 MG TABS Take 5 mg by mouth at bedtime.    . metoprolol succinate (TOPROL-XL) 25 MG 24 hr tablet TAKE 1 TABLET BY  MOUTH EVERY DAY (Patient taking differently: Take 12.5 mg by mouth daily. ) 90 tablet 1   Current Facility-Administered Medications on File Prior to Visit  Medication Dose Route Frequency Provider Last Rate Last Dose  . 0.9 %  sodium chloride infusion  500 mL Intravenous Continuous Pyrtle, Lajuan Lines, MD       Allergies  Allergen Reactions  . Nsaids Other (See Comments)    GI  Upset   . Crestor [Rosuvastatin Calcium] Other (See Comments)    Joint pain  . Penicillins Other (See Comments)    Unknown reaction Did it involve swelling of the face/tongue/throat, SOB, or low BP? Unknown Did it involve sudden or severe rash/hives, skin peeling, or any reaction on the inside of your mouth or nose? Unknown Did you need to seek medical attention at a hospital or doctor's office? Unknown When did it last happen? More than 50 years ago If all above answers are "NO", may proceed with cephalosporin use.    Social History   Socioeconomic History  . Marital status: Married    Spouse name: Not on file  . Number of children: 2  . Years of education: Not on file  . Highest education level: Not on file  Occupational History  . Occupation: retired  Scientific laboratory technician  . Financial resource strain: Not on file  . Food insecurity    Worry: Not on file    Inability: Not on file  . Transportation needs    Medical: Not on file    Non-medical: Not on file  Tobacco Use  . Smoking status: Former Smoker    Packs/day: 2.00    Years: 10.00    Pack years: 20.00    Types: Cigarettes    Quit date: 03/17/1970    Years since quitting: 48.8  . Smokeless tobacco: Never Used  Substance and Sexual Activity  . Alcohol use: No    Comment: quit 1995  . Drug use: No  . Sexual activity: Yes  Lifestyle  . Physical activity    Days per week: Not on file    Minutes per session: Not on file  . Stress: Not on file  Relationships  . Social Herbalist on phone: Not on file    Gets together: Not on file     Attends religious service: Not on file    Active member of club or organization: Not on file    Attends meetings of clubs or organizations: Not on file    Relationship status: Not on file  . Intimate partner violence    Fear of current or ex partner: Not on file    Emotionally abused: Not on file    Physically abused: Not on file    Forced sexual activity: Not on file  Other Topics Concern  . Not on file  Social History Narrative   Lives in  [place] with spouse.  2 Grown children.  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 Breast cancer Mother   . Ulcers Father        stomach  . COPD Father   . Tuberculosis Father   . Colon polyps Brother   . Tuberculosis Brother   . Brain cancer Brother   . Colon cancer Neg Hx   . Stomach cancer Neg Hx   . Rectal cancer Neg Hx   . Esophageal cancer Neg Hx   . Liver cancer Neg Hx       Review of Systems  All other systems reviewed and are negative.      Objective:   Physical Exam  Constitutional: He is oriented to person, place, and time. He appears well-developed and well-nourished. No distress.  HENT:  Head: Normocephalic and atraumatic.  Right Ear: External ear normal.  Left Ear: External ear normal.  Nose: Nose normal.  Mouth/Throat: Oropharynx is clear and moist. No oropharyngeal exudate.  Eyes: Pupils are equal, round, and reactive to light. Conjunctivae and EOM are normal. Right eye exhibits no discharge. Left eye exhibits no discharge. No scleral icterus.  Neck: Normal range of motion. Neck supple. No JVD present. No tracheal deviation present. No thyromegaly present.  Cardiovascular: Normal rate, regular rhythm, normal heart sounds and intact distal pulses. Exam reveals no gallop and no friction rub.  No murmur heard. Pulmonary/Chest: Effort normal and breath sounds normal. No stridor. No respiratory distress. He has no wheezes. He has no rales. He exhibits no tenderness.   Abdominal: Soft. Bowel sounds are normal. He exhibits no distension and no mass. There is no abdominal tenderness. There is no rebound and no guarding.  Musculoskeletal: Normal range of motion.        General: No tenderness or edema.  Lymphadenopathy:    He has no cervical adenopathy.  Neurological: He is alert and oriented to person, place, and time. He has normal reflexes. No cranial nerve deficit. He exhibits normal muscle tone. Coordination normal.  Skin: Skin is warm. No rash noted. He is not diaphoretic. No erythema. No pallor.  Psychiatric: He has a normal mood and affect. His behavior is normal. Judgment and thought content normal.  Vitals reviewed.         Assessment & Plan:  Hx-TIA (transient ischemic attack)  Coronary artery disease involving native coronary artery of native heart without angina pectoris  Systolic congestive heart failure, unspecified HF chronicity (HCC)  Benign essential HTN  Pure hypercholesterolemia  Atrial flutter, unspecified type ([place])  Personal history of colonic polyps  General medical exam  Serum potassium elevated - Plan: BASIC METABOLIC PANEL WITH GFR  I believe the potassium elevation is likely due to hemolysis.  I will repeat his BMP today.  If his potassium remains elevated we may need to reduce his dose of lisinopril.  His immunizations are up-to-date.  Colonoscopy is well up-to-date.  PSA is well within normal limits.  Given his history of TIA, his severe stenosis in a branch blood vessel on his catheterization, I have recommended to try to reduce his LDL cholesterol to less than 70.  He is already on a high intensity statin.  Therefore I recommended adding Repatha.  If this is too expensive, we could add Zetia.  Patient will check on the price of the Repatha and if too expensive, we will replace it with Zetia.  I question if he would benefit  taking an aspirin with Eliquis.  At the present time I believe the risk-benefit favors just  staying on the Eliquis.  I have recommended that he schedule a follow-up appoint with his cardiologist.  He denies any incidence of depression, memory loss, or falls

## 2019-01-21 LAB — BASIC METABOLIC PANEL WITH GFR
BUN/Creatinine Ratio: 19 (calc) (ref 6–22)
BUN: 24 mg/dL (ref 7–25)
CO2: 26 mmol/L (ref 20–32)
Calcium: 9.5 mg/dL (ref 8.6–10.3)
Chloride: 105 mmol/L (ref 98–110)
Creat: 1.25 mg/dL — ABNORMAL HIGH (ref 0.70–1.18)
GFR, Est African American: 64 mL/min/{1.73_m2} (ref >=60)
GFR, Est Non African American: 55 mL/min/{1.73_m2} — ABNORMAL LOW (ref >=60)
Glucose, Bld: 95 mg/dL (ref 65–99)
Potassium: 4.9 mmol/L (ref 3.5–5.3)
Sodium: 139 mmol/L (ref 135–146)

## 2019-02-02 DIAGNOSIS — H26493 Other secondary cataract, bilateral: Secondary | ICD-10-CM | POA: Diagnosis not present

## 2019-02-02 DIAGNOSIS — H401132 Primary open-angle glaucoma, bilateral, moderate stage: Secondary | ICD-10-CM | POA: Diagnosis not present

## 2019-02-02 DIAGNOSIS — H52203 Unspecified astigmatism, bilateral: Secondary | ICD-10-CM | POA: Diagnosis not present

## 2019-02-02 DIAGNOSIS — H35371 Puckering of macula, right eye: Secondary | ICD-10-CM | POA: Diagnosis not present

## 2019-03-30 ENCOUNTER — Ambulatory Visit: Payer: Medicare Other | Attending: Internal Medicine

## 2019-03-30 DIAGNOSIS — Z23 Encounter for immunization: Secondary | ICD-10-CM | POA: Insufficient documentation

## 2019-03-30 NOTE — Progress Notes (Signed)
   Covid-19 Vaccination Clinic  Name:  [name]    MRN: [id number] DOB: 1941/01/25  03/30/2019  [name] was observed post Covid-19 immunization for 30 minutes based on pre-vaccination screening without incidence. He was provided with Vaccine Information Sheet and instruction to access the V-Safe system.   [name] was instructed to call 911 with any severe reactions post vaccine: Marland Kitchen Difficulty breathing  . Swelling of your face and throat  . A fast heartbeat  . A bad rash all over your body  . Dizziness and weakness    Immunizations Administered    Name Date Dose VIS Date Route   Pfizer COVID-19 Vaccine 03/30/2019  9:36 AM 0.3 mL 02/25/2019 Intramuscular   Manufacturer: [place]   Lot: [id number]   [place]: [id number]

## 2019-04-15 ENCOUNTER — Other Ambulatory Visit: Payer: Self-pay | Admitting: Internal Medicine

## 2019-04-15 NOTE — Telephone Encounter (Signed)
Prescription refill request for Eliquis received.  Last office visit: 07/14/2018, Allred Scr: 1.25, 01/20/2019 Age: 79 y.o. Weight: 78 kg   Prescription refill sent.

## 2019-04-19 ENCOUNTER — Ambulatory Visit: Payer: PPO | Attending: Internal Medicine

## 2019-04-19 DIAGNOSIS — Z23 Encounter for immunization: Secondary | ICD-10-CM | POA: Insufficient documentation

## 2019-04-19 NOTE — Progress Notes (Signed)
   Covid-19 Vaccination Clinic  Name:  OFFIE FARRELL    MRN: [id number] DOB: 11/07/1940  04/19/2019  Mr. Colt was observed post Covid-19 immunization for 15 minutes without incidence. He was provided with Vaccine Information Sheet and instruction to access the V-Safe system.   Mr. Mcdade was instructed to call 911 with any severe reactions post vaccine: Marland Kitchen Difficulty breathing  . Swelling of your face and throat  . A fast heartbeat  . A bad rash all over your body  . Dizziness and weakness    Immunizations Administered    Name Date Dose VIS Date Route   Pfizer COVID-19 Vaccine 04/19/2019  8:36 AM 0.3 mL 02/25/2019 Intramuscular   Manufacturer: [place]   Lot: [id number]   [place]: [id number]

## 2019-05-04 ENCOUNTER — Ambulatory Visit (INDEPENDENT_AMBULATORY_CARE_PROVIDER_SITE_OTHER): Payer: PPO | Admitting: Orthopaedic Surgery

## 2019-05-04 ENCOUNTER — Ambulatory Visit (INDEPENDENT_AMBULATORY_CARE_PROVIDER_SITE_OTHER): Payer: PPO

## 2019-05-04 ENCOUNTER — Ambulatory Visit: Payer: Self-pay

## 2019-05-04 ENCOUNTER — Encounter: Payer: Self-pay | Admitting: Orthopaedic Surgery

## 2019-05-04 ENCOUNTER — Other Ambulatory Visit: Payer: Self-pay

## 2019-05-04 VITALS — Ht 70.0 in | Wt 165.0 lb

## 2019-05-04 DIAGNOSIS — M25552 Pain in left hip: Secondary | ICD-10-CM

## 2019-05-04 MED ORDER — METHYLPREDNISOLONE 4 MG PO TBPK: ORAL_TABLET | ORAL | 0 refills | Status: DC

## 2019-05-04 NOTE — Progress Notes (Signed)
Office Visit Note   Patient: [name]           Date of Birth: [birth year]           MRN: [id number] Visit Date: 05/04/2019              Requested by: Susy Frizzle, MD [address] [place],  [place] [address] PCP: Susy Frizzle, MD   Assessment & Plan: Visit Diagnoses:  1. Pain in left hip     Plan: Pain in the left hip may have several etiologies including IT band inflammation and possibly some early wear in his left total hip replacement.  Long discussion over the 45 minutes regarding all of the above.  I think it is worth trying a Medrol Dosepak.  He does have difficulty with NSAIDs.  Would like to reevaluate in 4 to 6 weeks if no improvement consider further diagnostic testing.  Not having any issues with his back  Follow-Up Instructions: Return if symptoms worsen or fail to improve.   Orders:  Orders Placed This Encounter  Procedures  . XR KNEE 3 VIEW LEFT  . XR HIP UNILAT W OR W/O PELVIS 2-3 VIEWS LEFT   Meds ordered this encounter  Medications  . methylPREDNISolone (MEDROL DOSEPAK) 4 MG TBPK tablet    Sig: Take as directed on package.    Dispense:  21 tablet    Refill:  0      Procedures: No procedures performed   Clinical Data: No additional findings.   Subjective: Chief Complaint  Patient presents with  . Left Hip - Pain  . Left Knee - Pain  Patient states that he developed pain in his left hip and left knee a few days ago. No known injury. His pain is located along his lateral hip and lateral thigh. He also initially had pain behind his patella, but states that his knee has went away entirely. He does not have groin, buttock, or lower back pain. No numbness, tingling, or weakness. He does not take anything for pain. The pain is only present when he gets up and moves around. No bruising or swelling. He said that the majority of his pain is located at his thigh. He has a history of a left total hip arthroplasty around 10 years ago.  Has  problems tolerating NSAIDs  HPI  Review of Systems   Objective: Vital Signs: Ht 5\' 10"  (1.778 m)   Wt 165 lb (74.8 kg)   BMI 23.68 kg/m   Physical Exam Constitutional:      Appearance: He is well-developed.  Eyes:     Pupils: Pupils are equal, round, and reactive to light.  Pulmonary:     Effort: Pulmonary effort is normal.  Skin:    General: Skin is warm and dry.  Neurological:     Mental Status: He is alert and oriented to person, place, and time.  Psychiatric:        Behavior: Behavior normal.     Ortho Exam awake alert and oriented x3.  Comfortable sitting.  No pain about the lateral aspect of his left hip.  Skin intact.  No pain with range of motion of his left hip no loss of motion.  Walks without a limp.  No ambulatory aid.  No localized tenderness about the thigh or the left knee.  Left knee was completely benign without any effusion.  It was not hot red warm swollen and no particular pain.  Straight  leg raise is negative.  No percussible tenderness of lumbar spine.  Neurologically intact  Specialty Comments:  No specialty comments available.  Imaging: XR KNEE 3 VIEW LEFT  Result Date: 05/04/2019 Films of the left knee obtained several projections.  There is very minimal narrowing of the medial joint line but no ectopic calcification and minimal subchondral sclerosis.  No osteophytes.  Few osteophytes at the patellofemoral joint but patella tracks in the midline.  Laterally the joint appears to be clear.  Films are consistent with mild osteoarthritis    PMFS History: Patient Active Problem List   Diagnosis Date Noted  . Pain in left hip 05/04/2019  . CAD (coronary artery disease)   . CHF (congestive heart failure) ([place])   . Chronic systolic CHF (congestive heart failure) ([place]) 10/05/2018  . Abnormal nuclear cardiac imaging test 08/26/2018  . Dehydration 07/21/2015  . Ectopic atrial tachycardia ([place])   . Hypotension 07/20/2015  . Fatigue 07/20/2015  .  Atrial flutter ([place]) 11/20/2010  . Personal history of colonic polyps 10/28/2010  . Diverticulitis   . Hemorrhoids, internal   . BPH (benign prostatic hypertrophy)   . Hx-TIA (transient ischemic attack)   . Hypertrophic nonobstructive cardiomyopathy ([place]) 08/15/2010  . Hypertension, essential 03/05/2009  . HLD (hyperlipidemia) 03/01/2009   Past Medical History:  Diagnosis Date  . Atrial flutter ([place])    a. s/p CTI ablation x2 by Dr Rayann Heman 10/12, 9/13  . Basal cell carcinoma of cheek   . BPH (benign prostatic hyperplasia)   . CAD (coronary artery disease)    nonobstructive on cath 08/2018  . CHF (congestive heart failure) ([place])    ef 35% on Echo 2020  . CKD (chronic kidney disease) stage 3, GFR 30-59 ml/min   . Colon polyps   . Diverticulosis   . Ectopic atrial tachycardia ([place])    a. Dx 07/2015: rate controlled   . Hemorrhoids   . Hyperlipidemia   . Hypertension   . Hypertrophic cardiomyopathy ([place])   . TIA (transient ischemic attack)     Family History  Problem Relation Age of Onset  . Breast cancer Mother   . Ulcers Father        stomach  . COPD Father   . Tuberculosis Father   . Colon polyps Brother   . Tuberculosis Brother   . Brain cancer Brother   . Colon cancer Neg Hx   . Stomach cancer Neg Hx   . Rectal cancer Neg Hx   . Esophageal cancer Neg Hx   . Liver cancer Neg Hx     Past Surgical History:  Procedure Laterality Date  . ATRIAL ABLATION SURGERY  10/12   CTI ablation for atrial flutter by Dr Rayann Heman  . ATRIAL FLUTTER ABLATION N/A 12/11/2011   CTI ablation for atrial flutter by Dr Rayann Heman  . BASAL CELL CARCINOMA EXCISION  ~ 1973   left cheek  . CARDIAC CATHETERIZATION  1960's  . HERNIA REPAIR  ~ [id number]   umbilical  . JOINT REPLACEMENT    . RIGHT/LEFT HEART CATH AND CORONARY ANGIOGRAPHY N/A 08/26/2018   Procedure: RIGHT/LEFT HEART CATH AND CORONARY ANGIOGRAPHY;  Surgeon: Sherren Mocha, MD;  Location: [facility];  Service: Cardiovascular;   Laterality: N/A;  . TONSILLECTOMY AND ADENOIDECTOMY  1947  . TOTAL HIP ARTHROPLASTY     left   Social History   Occupational History  . Occupation: retired  Tobacco Use  . Smoking status: Former Smoker    Packs/day: 2.00  Years: 10.00    Pack years: 20.00    Types: Cigarettes    Quit date: 03/17/1970    Years since quitting: 49.1  . Smokeless tobacco: Never Used  Substance and Sexual Activity  . Alcohol use: No    Comment: quit 1995  . Drug use: No  . Sexual activity: Yes

## 2019-06-24 DIAGNOSIS — S0501XA Injury of conjunctiva and corneal abrasion without foreign body, right eye, initial encounter: Secondary | ICD-10-CM | POA: Diagnosis not present

## 2019-07-09 ENCOUNTER — Other Ambulatory Visit: Payer: Self-pay | Admitting: Family Medicine

## 2019-07-11 ENCOUNTER — Other Ambulatory Visit: Payer: Self-pay | Admitting: Family Medicine

## 2019-07-25 ENCOUNTER — Telehealth: Payer: Self-pay | Admitting: Family Medicine

## 2019-07-25 NOTE — Chronic Care Management (AMB) (Signed)
  Chronic Care Management   Outreach Note  07/25/2019 Name: [name] MRN: [id number] DOB: May 29, 1940  Referred by: Susy Frizzle, MD Reason for referral : Chronic Care Management   An unsuccessful telephone outreach was attempted today. The patient was referred to the pharmacist for assistance with care management and care coordination.   Follow Up Plan:   [place]

## 2019-08-24 ENCOUNTER — Telehealth: Payer: Self-pay | Admitting: Family Medicine

## 2019-08-24 NOTE — Progress Notes (Signed)
  Chronic Care Management   Outreach Note  08/24/2019 Name: [name] MRN: [id number] DOB: 1940-07-09  Referred by: Susy Frizzle, MD Reason for referral : No chief complaint on file.   A second unsuccessful telephone outreach was attempted today. The patient was referred to pharmacist for assistance with care management and care coordination.  Follow Up Plan:   [place]

## 2019-08-24 NOTE — Progress Notes (Signed)
  Chronic Care Management   Note  08/24/2019 Name: CHRISTIPHER RIEGER MRN: [id number] DOB: 1941/01/24  NIKASH MORTENSEN is a 79 y.o. year old male who is a primary care patient of Susy Frizzle, MD. I reached out to [name] by phone today in response to a referral sent by Mr. Kaylee Wombles Halfmann's PCP, Susy Frizzle, MD.   Mr. Daughety was given information about Chronic Care Management services today including:  1. CCM service includes personalized support from designated clinical staff supervised by his physician, including individualized plan of care and coordination with other care providers 2. 24/7 contact phone numbers for assistance for urgent and routine care needs. 3. Service will only be billed when office clinical staff spend 20 minutes or more in a month to coordinate care. 4. Only one practitioner may furnish and bill the service in a calendar month. 5. The patient may stop CCM services at any time (effective at the end of the month) by phone call to the office staff.   Patient agreed to services and verbal consent obtained.   Wife Bethena Roys scheduled appt.   Follow up plan:  [place]

## 2019-09-17 ENCOUNTER — Other Ambulatory Visit: Payer: Self-pay | Admitting: Internal Medicine

## 2019-09-20 NOTE — Telephone Encounter (Addendum)
Eliquis 5mg  refill request received. Patient is 79 years old, weight-74.8kg, Crea-1.25 on 01/20/2019 , Diagnosis-Aflutter, and last seen by Dr. Rayann Heman via telemedicine visit on 07/14/2018-NEEDS APPT, missed 09/2018 appt with Richardson Dopp. Dose is appropriate based on dosing criteria.  Will send a note to scheduling dept.   [place] the scheduler called the pt and made an appt for the pt on 10/19/2019 via video visit with Dr. Rayann Heman, therefore will send in a refill for the pt at this time.

## 2019-09-22 ENCOUNTER — Other Ambulatory Visit: Payer: Self-pay | Admitting: Family Medicine

## 2019-09-22 NOTE — Telephone Encounter (Signed)
Needs to make appointment.

## 2019-09-28 ENCOUNTER — Telehealth: Payer: Self-pay

## 2019-09-28 NOTE — Telephone Encounter (Signed)
Please advise 

## 2019-09-28 NOTE — Telephone Encounter (Signed)
[name] with [facility] would like to know if patient needs pre medication before treatment?  Cb# [phone].  Please advise.  Thank you.

## 2019-09-28 NOTE — Telephone Encounter (Signed)
Yes to the antibiotic coverage per the protocol-thanks

## 2019-09-29 ENCOUNTER — Other Ambulatory Visit: Payer: Self-pay

## 2019-09-29 ENCOUNTER — Telehealth: Payer: Self-pay | Admitting: Orthopaedic Surgery

## 2019-09-29 ENCOUNTER — Telehealth: Payer: Self-pay

## 2019-09-29 MED ORDER — CLINDAMYCIN HCL 300 MG PO CAPS
ORAL_CAPSULE | ORAL | 0 refills | Status: DC
Start: 2019-09-29 — End: 2020-10-29

## 2019-09-29 NOTE — Telephone Encounter (Signed)
Called and spoke with patient. Medication has been sent in and she was advised.

## 2019-09-29 NOTE — Telephone Encounter (Signed)
Tried to call patient. No answer. Left message on machine that Dr.Whitfield does want him to take antibiotics before any dental work is performed. I have sent in Clindamycin to his pharmacy on file, since he has an allergy to Penicillin.

## 2019-09-29 NOTE — Telephone Encounter (Signed)
[name] called and left a VM concerning pre medication.  CB# [phone].  Please advise.  Thank you.

## 2019-09-29 NOTE — Telephone Encounter (Signed)
Tried to call [employer] back. No answer and phone just rang and rang.

## 2019-09-29 NOTE — Telephone Encounter (Signed)
Patient's wife called.   Returning our call. Asked that we call her cellphone instead next time   Call back: [phone]

## 2019-09-29 NOTE — Telephone Encounter (Signed)
Tried to call them again. No answer. Left message on general voicemail with information that he needs to be premedicated. Medication has already been sent to pharmacy and also let her know that patient has already bee advised of this information as well. Ask her to call back with any other questions.

## 2019-09-30 ENCOUNTER — Other Ambulatory Visit: Payer: Self-pay | Admitting: Nurse Practitioner

## 2019-10-18 ENCOUNTER — Telehealth: Payer: Self-pay

## 2019-10-18 NOTE — Telephone Encounter (Signed)
Left a message regarding virtual appt on 10/19/19.

## 2019-10-18 NOTE — Chronic Care Management (AMB) (Addendum)
Chronic Care Management Pharmacy  Name: VRAJ DENARDO  MRN: [id number] DOB: 03-20-1940  Chief Complaint/ HPI  [name],  79 y.o. , male presents for their Initial CCM visit with the clinical pharmacist via telephone.  PCP : Susy Frizzle, MD  Their chronic conditions include: hypertension, CHF, CAD, hyperlipidemia, history of TIA.  Office Visits: 01/20/2019 Dennard Schaumann) - physical exam LDL uncontrolled, added Zetia or Repatha depending on price  Consult Visit: 05/03/2018 Durward Fortes, Ortho) - paint in left hip Trial of medrol dosepak Re-evaluate in 4 to 6 weeks of no improvement Medications: Outpatient Encounter Medications as of 10/19/2019  Medication Sig   atorvastatin (LIPITOR) 40 MG tablet TAKE 1 TABLET BY MOUTH EVERY DAY   clindamycin (CLEOCIN) 300 MG capsule Take 2 capsules 1hour prior to dental procedure.   Coenzyme Q10 (COQ10) 200 MG CAPS Take 200 mg by mouth every evening.   ELIQUIS 5 MG TABS tablet TAKE 1 TABLET BY MOUTH TWICE A DAY   ezetimibe (ZETIA) 10 MG tablet Take 1 tablet (10 mg total) by mouth daily.   lisinopril (ZESTRIL) 10 MG tablet TAKE 1 TABLET BY MOUTH EVERY DAY IN THE EVENING   Melatonin 5 MG TABS Take 5 mg by mouth at bedtime.   metoprolol succinate (TOPROL-XL) 25 MG 24 hr tablet Take 1 tablet (25 mg total) by mouth daily.   methylPREDNISolone (MEDROL DOSEPAK) 4 MG TBPK tablet Take as directed on package. (Patient not taking: Reported on 10/19/2019)   Facility-Administered Encounter Medications as of 10/19/2019  Medication   0.9 %  sodium chloride infusion     Current Diagnosis/Assessment:  Goals Addressed             This Visit's Progress    Pharmacy Care Plan:       CARE PLAN ENTRY (see longitudinal plan of care for additional care plan information)  Current Barriers:  Chronic Disease Management support, education, and care coordination needs related to Hypertension, Hyperlipidemia, and Heart Failure   Hypertension BP Readings  from Last 3 Encounters:  01/20/19 140/80  08/26/18 105/66  07/11/18 117/68   Pharmacist Clinical Goal(s): Over the next 180 days, patient will work with PharmD and providers to maintain BP goal <130/80 Current regimen:  Lisinopril '10mg'$  daily Metoprolol Succinate Xl '25mg'$  one-half tablet daily Interventions: Reviewed home monitoring of blood pressure Patient self care activities - Over the next 180 days, patient will: Check BP daily, document, and provide at future appointments Ensure daily salt intake < 2300 mg/day  Hyperlipidemia Lab Results  Component Value Date/Time   LDLCALC 101 (H) 01/18/2019 09:20 AM   Pharmacist Clinical Goal(s): Over the next 180 days, patient will work with PharmD and providers to achieve LDL goal < 70 Current regimen:  Atorvastatin '40mg'$  daily Zetia '10mg'$  daily Interventions: Reviewed most recent lipid panel Discussed compliance of cholesterol medication Recommended repeat lipid panel since starting Zetia Patient self care activities - Over the next 180 days, patient will: Continue to focus on medication adherence by pill count Report to PCP for updated lab work when order is placed.   Heart Failure Pharmacist Clinical Goal(s) Over the next 180 days, patient will work with PharmD and providers to optimize medication and minimize symptoms related to HF. Current regimen:  Metoprolol XL '25mg'$  daily Interventions: Reviewed most recent cardiology notes Discussed current swelling symptoms. Patient self care activities - Over the next 180 days, patient will: Continue to focus on medication adherence by pill count Contact providers with any sudden weight  gain of 3 pounds in one day or 5 pounds in one week.   Initial goal documentation         Heart Failure   Type: Systolic  Last ejection fraction: 35% NYHA Class: I (no actitivty limitation) AHA HF Stage: B (Heart disease present - no symptoms present)  Patient has failed these meds in past:  none noted Patient is currently controlled on the following medications:  Metoprolol XL '25mg'$  daily  Patient reports no swelling, no chest pain.  Reports his HR is constantly in the 60s.  Repeat echo ordered by cardiologist for updated EF.  He is considering adding Entresto depending on updated EF.  Plan  Continue current medications, cardiologist ordered updated echo.  Hypertension     Office blood pressures are  BP Readings from Last 3 Encounters:  10/19/19 118/63  01/20/19 140/80  08/26/18 105/66    Patient has failed these meds in the past: Verapamil  Patient checks BP at home daily  Patient home BP readings are ranging: 115-120/65-70 HR 60s Patient is currently controlled on the following medications:  Lisinopril '10mg'$  daily Metoprolol XL '25mg'$  -  One half tablet daily qam  BP well controlled.  Reports compliance with both medications.  Requesting [facility] on [facility]  No adverse effects, reports no dizziness, HR seems to be find on metoprolol.  Plan  Continue current medications    Hyperlipidemia   LDL goal < 70  Lipid Panel     Component Value Date/Time   CHOL 183 01/18/2019 0920   TRIG 71 01/18/2019 0920   HDL 66 01/18/2019 0920   LDLCALC 101 (H) 01/18/2019 0920    Hepatic Function Latest Ref Rng & Units 01/18/2019 10/08/2017 11/03/2016  Total Protein 6.1 - 8.1 g/dL 6.3 6.2 6.3  Albumin 3.6 - 5.1 g/dL - - 4.2  AST 10 - 35 U/L '22 20 18  '$ ALT 9 - 46 U/L '21 13 12  '$ Alk Phosphatase 40 - 115 U/L - - 25(L)  Total Bilirubin 0.2 - 1.2 mg/dL 0.7 0.5 0.7  Bilirubin, Direct 0.0 - 0.3 mg/dL - - -     The 10-year ASCVD risk score Mikey Bussing DC Jr., et al., 2013) is: 29.1%   Values used to calculate the score:     Age: 87 years     Sex: Male     Is Non-Hispanic African American: No     Diabetic: No     Tobacco smoker: No     Systolic Blood Pressure: 962 mmHg     Is BP treated: Yes     HDL Cholesterol: 66 mg/dL     Total Cholesterol: 183 mg/dL    Patient has failed these meds in past: Repatha (cost) Patient is currently uncontrolled on the following medications:  Atorvastatin '40mg'$  Zetia '10mg'$   Last lipid panel 01/2019.  Patient started zetia '10mg'$  then since he was not at goal.  Reports he is tolerating medication well since then and taking daily.  Discussed need for lipid panel to evaluate efficacy of Zetia. Also requested [facility]  Plan  Continue current medications, recommend repeat lipid panel, refill Zetia.  Hx of TIA   Patient has failed these meds in past: none noted Patient is currently controlled on the following medications:  Eliquis '5mg'$  tablet  No signs of abnormal bruising or bleeding.  Takes medication daily, reports he does usually enter the donut hole around the last quarter.  However, patient  reports yearly income of around 100k from retirement, SSI, and other savings. Unsure if qualified for any programs, but will do research.  Plan  Continue current medications, determine eligibility for any Eliquis programs.  Vaccines   Reviewed and discussed patient's vaccination history.    Immunization History  Administered Date(s) Administered   Fluad Quad(high Dose 65+) 12/02/2018   Hepatitis A 12/06/1997, 09/14/1998   Hepatitis B 12/06/1997, 01/05/1998, 09/07/1998   Influenza, High Dose Seasonal PF 12/31/2016, 12/17/2017   Influenza,inj,Quad PF,6+ Mos 12/28/2012, 01/03/2014, 12/27/2014, 12/18/2015   PFIZER SARS-COV-2 Vaccination 03/30/2019, 04/19/2019   Pneumococcal Conjugate-13 12/27/2014   Pneumococcal-Unspecified 11/16/2010   Tdap 08/16/2011   Zoster 12/31/2010   Zoster Recombinat (Shingrix) 03/05/2017, 05/11/2017    Plan  Patient is up to date on all vaccination recommendations. Medication Management   Miscellaneous medications: None OTC's: CoQ10, melatonin '5mg'$  [facility]  Phone #  [phone] Patient reports using pill box  method to organize medications and promote adherence. Patient denies missed doses of medication.   Beverly Milch, PharmD Clinical Pharmacist [place] [phone]  I have collaborated with the care management provider regarding care management and care coordination activities outlined in this encounter and have reviewed this encounter including documentation in the note and care plan. I am certifying that I agree with the content of this note and encounter as supervising physician.

## 2019-10-19 ENCOUNTER — Telehealth: Payer: Self-pay | Admitting: *Deleted

## 2019-10-19 ENCOUNTER — Encounter: Payer: Self-pay | Admitting: Internal Medicine

## 2019-10-19 ENCOUNTER — Other Ambulatory Visit: Payer: Self-pay

## 2019-10-19 ENCOUNTER — Telehealth (INDEPENDENT_AMBULATORY_CARE_PROVIDER_SITE_OTHER): Payer: PPO | Admitting: Internal Medicine

## 2019-10-19 ENCOUNTER — Ambulatory Visit: Payer: PPO | Admitting: Pharmacist

## 2019-10-19 VITALS — BP 118/63 | HR 63 | Ht 70.0 in | Wt 170.0 lb

## 2019-10-19 DIAGNOSIS — D6869 Other thrombophilia: Secondary | ICD-10-CM | POA: Diagnosis not present

## 2019-10-19 DIAGNOSIS — I422 Other hypertrophic cardiomyopathy: Secondary | ICD-10-CM | POA: Diagnosis not present

## 2019-10-19 DIAGNOSIS — I519 Heart disease, unspecified: Secondary | ICD-10-CM

## 2019-10-19 DIAGNOSIS — I5020 Unspecified systolic (congestive) heart failure: Secondary | ICD-10-CM

## 2019-10-19 DIAGNOSIS — I10 Essential (primary) hypertension: Secondary | ICD-10-CM

## 2019-10-19 DIAGNOSIS — I428 Other cardiomyopathies: Secondary | ICD-10-CM | POA: Diagnosis not present

## 2019-10-19 DIAGNOSIS — E785 Hyperlipidemia, unspecified: Secondary | ICD-10-CM

## 2019-10-19 MED ORDER — EZETIMIBE 10 MG PO TABS: 10.0000 mg | ORAL_TABLET | Freq: Every day | ORAL | 3 refills | Status: DC

## 2019-10-19 MED ORDER — METOPROLOL SUCCINATE ER 25 MG PO TB24: 25.0000 mg | ORAL_TABLET | Freq: Every day | ORAL | 0 refills | Status: DC

## 2019-10-19 NOTE — Patient Instructions (Addendum)
Visit Information Thank you for meeting with me today!  I look forward to working with you to help you meet all of your healthcare goals and answer any questions you may have.  Feel free to contact me anytime!  Goals Addressed            This Visit's Progress   . Pharmacy Care Plan:       CARE PLAN ENTRY (see longitudinal plan of care for additional care plan information)  Current Barriers:  . Chronic Disease Management support, education, and care coordination needs related to Hypertension, Hyperlipidemia, and Heart Failure   Hypertension BP Readings from Last 3 Encounters:  01/20/19 140/80  08/26/18 105/66  07/11/18 117/68   . Pharmacist Clinical Goal(s): o Over the next 180 days, patient will work with PharmD and providers to maintain BP goal <130/80 . Current regimen:  o Lisinopril 10mg  daily o Metoprolol Succinate Xl 25mg  one-half tablet daily . Interventions: o Reviewed home monitoring of blood pressure . Patient self care activities - Over the next 180 days, patient will: o Check BP daily, document, and provide at future appointments o Ensure daily salt intake < 2300 mg/day  Hyperlipidemia Lab Results  Component Value Date/Time   LDLCALC 101 (H) 01/18/2019 09:20 AM   . Pharmacist Clinical Goal(s): o Over the next 180 days, patient will work with PharmD and providers to achieve LDL goal < 70 . Current regimen:  o Atorvastatin 40mg  daily o Zetia 10mg  daily . Interventions: o Reviewed most recent lipid panel o Discussed compliance of cholesterol medication o Recommended repeat lipid panel since starting Zetia . Patient self care activities - Over the next 180 days, patient will: o Continue to focus on medication adherence by pill count o Report to PCP for updated lab work when order is placed.   Heart Failure . Pharmacist Clinical Goal(s) o Over the next 180 days, patient will work with PharmD and providers to optimize medication and minimize symptoms related  to HF. . Current regimen:  o Metoprolol XL 25mg  daily . Interventions: o Reviewed most recent cardiology notes o Discussed current swelling symptoms. . Patient self care activities - Over the next 180 days, patient will: o Continue to focus on medication adherence by pill count o Contact providers with any sudden weight gain of 3 pounds in one day or 5 pounds in one week.   Initial goal documentation        [name] was given information about Chronic Care Management services today including:  1. CCM service includes personalized support from designated clinical staff supervised by his physician, including individualized plan of care and coordination with other care providers 2. 24/7 contact phone numbers for assistance for urgent and routine care needs. 3. Standard insurance, coinsurance, copays and deductibles apply for chronic care management only during months in which we provide at least 20 minutes of these services. Most insurances cover these services at 100%, however patients may be responsible for any copay, coinsurance and/or deductible if applicable. This service may help you avoid the need for more expensive face-to-face services. 4. Only one practitioner may furnish and bill the service in a calendar month. 5. The patient may stop CCM services at any time (effective at the end of the month) by phone call to the office staff.  Patient agreed to services and verbal consent obtained.   The patient verbalized understanding of instructions provided today and agreed to receive a mailed copy of patient instruction and/or educational materials. Telephone  follow up appointment with pharmacy team member scheduled for: 6 months  Beverly Milch, PharmD Clinical Pharmacist [facility] [phone]   High Cholesterol  High cholesterol is a condition in which the blood has high levels of a white, waxy, fat-like substance (cholesterol). The human body needs small  amounts of cholesterol. The liver makes all the cholesterol that the body needs. Extra (excess) cholesterol comes from the food that we eat. Cholesterol is carried from the liver by the blood through the blood vessels. If you have high cholesterol, deposits (plaques) may build up on the walls of your blood vessels (arteries). Plaques make the arteries narrower and stiffer. Cholesterol plaques increase your risk for heart attack and stroke. Work with your health care provider to keep your cholesterol levels in a healthy range. What increases the risk? This condition is more likely to develop in people who:  Eat foods that are high in animal fat (saturated fat) or cholesterol.  Are overweight.  Are not getting enough exercise.  Have a family history of high cholesterol. What are the signs or symptoms? There are no symptoms of this condition. How is this diagnosed? This condition may be diagnosed from the results of a blood test.  If you are older than age 69, your health care provider may check your cholesterol every 4-6 years.  You may be checked more often if you already have high cholesterol or other risk factors for heart disease. The blood test for cholesterol measures:  "Bad" cholesterol (LDL cholesterol). This is the main type of cholesterol that causes heart disease. The desired level for LDL is less than 100.  "Good" cholesterol (HDL cholesterol). This type helps to protect against heart disease by cleaning the arteries and carrying the LDL away. The desired level for HDL is 60 or higher.  Triglycerides. These are fats that the body can store or burn for energy. The desired number for triglycerides is lower than 150.  Total cholesterol. This is a measure of the total amount of cholesterol in your blood, including LDL cholesterol, HDL cholesterol, and triglycerides. A healthy number is less than 200. How is this treated? This condition is treated with diet changes, lifestyle  changes, and medicines. Diet changes  This may include eating more whole grains, fruits, vegetables, nuts, and fish.  This may also include cutting back on red meat and foods that have a lot of added sugar. Lifestyle changes  Changes may include getting at least 40 minutes of aerobic exercise 3 times a week. Aerobic exercises include walking, biking, and swimming. Aerobic exercise along with a healthy diet can help you maintain a healthy weight.  Changes may also include quitting smoking. Medicines  Medicines are usually given if diet and lifestyle changes have failed to reduce your cholesterol to healthy levels.  Your health care provider may prescribe a statin medicine. Statin medicines have been shown to reduce cholesterol, which can reduce the risk of heart disease. Follow these instructions at home: Eating and drinking If told by your health care provider:  Eat chicken (without skin), fish, veal, shellfish, ground [place] breast, and round or loin cuts of red meat.  Do not eat fried foods or fatty meats, such as hot dogs and salami.  Eat plenty of fruits, such as apples.  Eat plenty of vegetables, such as broccoli, potatoes, and carrots.  Eat beans, peas, and lentils.  Eat grains such as barley, rice, couscous, and bulgur wheat.  Eat pasta without cream sauces.  Use skim or nonfat milk, and eat low-fat or nonfat yogurt and cheeses.  Do not eat or drink whole milk, cream, ice cream, egg yolks, or hard cheeses.  Do not eat stick margarine or tub margarines that contain trans fats (also called partially hydrogenated oils).  Do not eat saturated tropical oils, such as coconut oil and palm oil.  Do not eat cakes, cookies, crackers, or other baked goods that contain trans fats.  General instructions  Exercise as directed by your health care provider. Increase your activity level with activities such as gardening, walking, and taking the stairs.  Take over-the-counter and  prescription medicines only as told by your health care provider.  Do not use any products that contain nicotine or tobacco, such as cigarettes and e-cigarettes. If you need help quitting, ask your health care provider.  Keep all follow-up visits as told by your health care provider. This is important. Contact a health care provider if:  You are struggling to maintain a healthy diet or weight.  You need help to start on an exercise program.  You need help to stop smoking. Get help right away if:  You have chest pain.  You have trouble breathing. This information is not intended to replace advice given to you by your health care provider. Make sure you discuss any questions you have with your health care provider. Document Revised: 03/06/2017 Document Reviewed: 09/01/2015 Elsevier Patient Education  [place].

## 2019-10-19 NOTE — Telephone Encounter (Signed)
-----   Message from Thompson Grayer, MD sent at 10/19/2019  9:42 AM EDT ----- Repeat echo now  If EF <40%, follow-up with Richardson Dopp in 6 months If EF>40, follow-up with me in a year

## 2019-10-19 NOTE — Progress Notes (Signed)
Electrophysiology TeleHealth Note   Due to national recommendations of social distancing due to COVID 19, an audio/video telehealth visit is felt to be most appropriate for this patient at this time.  See MyChart message from today for the patient's consent to telehealth for [facility].  Date:  10/19/2019   ID:  [name], DOB 12-15-1940, MRN [id number]  Location: patient's home  Provider location:  [place]  Evaluation Performed: Follow-up visit  PCP:  Susy Frizzle, MD   Electrophysiologist:  Dr Rayann Heman  Chief Complaint:  palpitations  History of Present Illness:    [name] is a 79 y.o. male who presents via telehealth conferencing today.  Since last being seen in our clinic, the patient reports doing very well.  Today, he denies symptoms of palpitations, chest pain, shortness of breath,  lower extremity edema, dizziness, presyncope, or syncope.  The patient is otherwise without complaint today.   Past Medical History:  Diagnosis Date  . Atrial flutter ([place])    a. s/p CTI ablation x2 by Dr Rayann Heman 10/12, 9/13  . Basal cell carcinoma of cheek   . BPH (benign prostatic hyperplasia)   . CAD (coronary artery disease)    nonobstructive on cath 08/2018  . CHF (congestive heart failure) ([place])    ef 35% on Echo 2020  . CKD (chronic kidney disease) stage 3, GFR 30-59 ml/min   . Colon polyps   . Diverticulosis   . Ectopic atrial tachycardia ([place])    a. Dx 07/2015: rate controlled   . Hemorrhoids   . Hyperlipidemia   . Hypertension   . Hypertrophic cardiomyopathy ([place])   . TIA (transient ischemic attack)     Past Surgical History:  Procedure Laterality Date  . ATRIAL ABLATION SURGERY  10/12   CTI ablation for atrial flutter by Dr Rayann Heman  . ATRIAL FLUTTER ABLATION N/A 12/11/2011   CTI ablation for atrial flutter by Dr Rayann Heman  . BASAL CELL CARCINOMA EXCISION  ~ 1973   left cheek  . CARDIAC CATHETERIZATION  1960's  . HERNIA REPAIR  ~ 7989    umbilical  . JOINT REPLACEMENT    . RIGHT/LEFT HEART CATH AND CORONARY ANGIOGRAPHY N/A 08/26/2018   Procedure: RIGHT/LEFT HEART CATH AND CORONARY ANGIOGRAPHY;  Surgeon: Sherren Mocha, MD;  Location: [facility];  Service: Cardiovascular;  Laterality: N/A;  . TONSILLECTOMY AND ADENOIDECTOMY  1947  . TOTAL HIP ARTHROPLASTY     left    Current Outpatient Medications  Medication Sig Dispense Refill  . atorvastatin (LIPITOR) 40 MG tablet TAKE 1 TABLET BY MOUTH EVERY DAY 90 tablet 1  . clindamycin (CLEOCIN) 300 MG capsule Take 2 capsules 1hour prior to dental procedure. 6 capsule 0  . Coenzyme Q10 (COQ10) 200 MG CAPS Take 200 mg by mouth every evening.    Marland Kitchen ELIQUIS 5 MG TABS tablet TAKE 1 TABLET BY MOUTH TWICE A DAY 180 tablet 1  . ezetimibe (ZETIA) 10 MG tablet Take 1 tablet (10 mg total) by mouth daily. 90 tablet 3  . lisinopril (ZESTRIL) 10 MG tablet TAKE 1 TABLET BY MOUTH EVERY DAY IN THE EVENING 90 tablet 1  . Melatonin 5 MG TABS Take 5 mg by mouth at bedtime.    . methylPREDNISolone (MEDROL DOSEPAK) 4 MG TBPK tablet Take as directed on package. 21 tablet 0  . metoprolol succinate (TOPROL-XL) 25 MG 24 hr tablet Take 1 tablet (25 mg total) by mouth daily. 15 tablet 0  Current Facility-Administered Medications  Medication Dose Route Frequency Provider Last Rate Last Admin  . 0.9 %  sodium chloride infusion  500 mL Intravenous Continuous Pyrtle, Lajuan Lines, MD        Allergies:   Nsaids, Crestor [rosuvastatin calcium], and Penicillins   Social History:  The patient  reports that he quit smoking about 49 years ago. His smoking use included cigarettes. He has a 20.00 pack-year smoking history. He has never used smokeless tobacco. He reports that he does not drink alcohol and does not use drugs.   ROS:  Please see the history of present illness.   All other systems are personally reviewed and negative.    Exam:    Vital Signs:  BP 118/63   Pulse 63   Ht 5\' 10"  (1.778 m)   Wt 170 lb  (77.1 kg)   BMI 24.39 kg/m   Well sounding and appearing, alert and conversant, regular work of breathing,  good skin color Eyes- anicteric, neuro- grossly intact, skin- no apparent rash or lesions or cyanosis, mouth- oral mucosa is pink  Labs/Other Tests and Data Reviewed:    Recent Labs: 01/18/2019: ALT 21; Hemoglobin 14.9; Platelets 195 01/20/2019: BUN 24; Creat 1.25; Potassium 4.9; Sodium 139   Wt Readings from Last 3 Encounters:  10/19/19 170 lb (77.1 kg)  05/04/19 165 lb (74.8 kg)  01/20/19 172 lb (78 kg)     ASSESSMENT & PLAN:    1.  Nonischemic CM Previously felt to have hypertrophic CM (nonobstructive) EF was 35% by me 05/21/2018 NYHA Class I currently Repeat echo at this time If EF<40%, we should consider entresto.  If EF >40%, no changes Consider PYP scan further evaluation pending echo results  2. Nonobstructive CAD/ HL Lipids reviewed with him today Lifestyle modification discussed He had zetia added 01/2019 but lipid profile has not been repeated.  He will discuss this with PCP.  I would advise repeat lipids by PCP at this time  3. First degree AV Block/ RBBB No symptoms of conduction system disease I would not advise increased beta blocker  4. HTN Stable No change required today  5. Prior atrial flutter/ severe LA enlargement On eliquis   Risks, benefits and potential toxicities for medications prescribed and/or refilled reviewed with patient today.   Follow-up: 6 months with cardiology APP if EF<40%,  If EF has recovered, I will see in a year instead   Patient Risk:  after full review of this patients clinical status, I feel that they are at moderate risk at this time.  Today, I have spent 15 minutes with the patient with telehealth technology discussing arrhythmia management .    Army Fossa, MD  10/19/2019 9:33 AM     [facility] [address] [place] [address] [phone] (office) [phone]  (fax)

## 2019-10-21 ENCOUNTER — Other Ambulatory Visit: Payer: Self-pay | Admitting: *Deleted

## 2019-10-21 DIAGNOSIS — I4892 Unspecified atrial flutter: Secondary | ICD-10-CM

## 2019-10-21 DIAGNOSIS — Z86010 Personal history of colonic polyps: Secondary | ICD-10-CM

## 2019-10-21 DIAGNOSIS — E7800 Pure hypercholesterolemia, unspecified: Secondary | ICD-10-CM

## 2019-10-21 DIAGNOSIS — I5020 Unspecified systolic (congestive) heart failure: Secondary | ICD-10-CM

## 2019-10-21 DIAGNOSIS — I2510 Atherosclerotic heart disease of native coronary artery without angina pectoris: Secondary | ICD-10-CM

## 2019-10-21 DIAGNOSIS — E785 Hyperlipidemia, unspecified: Secondary | ICD-10-CM

## 2019-10-21 DIAGNOSIS — R931 Abnormal findings on diagnostic imaging of heart and coronary circulation: Secondary | ICD-10-CM

## 2019-10-21 DIAGNOSIS — I10 Essential (primary) hypertension: Secondary | ICD-10-CM

## 2019-10-21 DIAGNOSIS — Z8673 Personal history of transient ischemic attack (TIA), and cerebral infarction without residual deficits: Secondary | ICD-10-CM

## 2019-10-28 ENCOUNTER — Other Ambulatory Visit: Payer: Self-pay | Admitting: Family Medicine

## 2019-11-08 ENCOUNTER — Ambulatory Visit (HOSPITAL_COMMUNITY): Payer: PPO | Attending: Cardiology

## 2019-11-08 ENCOUNTER — Other Ambulatory Visit: Payer: Self-pay

## 2019-11-08 DIAGNOSIS — I428 Other cardiomyopathies: Secondary | ICD-10-CM | POA: Diagnosis not present

## 2019-11-08 LAB — ECHOCARDIOGRAM COMPLETE
Area-P 1/2: 2.54 cm2
S' Lateral: 3.8 cm

## 2019-11-10 ENCOUNTER — Telehealth: Payer: Self-pay | Admitting: Pharmacist

## 2019-11-10 NOTE — Telephone Encounter (Signed)
Darrell Jewel, RN  11/10/2019 1:55 PM EDT Back to Top    The patient has been notified of the result and verbalized understanding. All questions (if any) were answered. Patient aware of medication entresto to be started and that the pharmacy clinic will reach out to him shortly. Darrell Jewel, RN 11/10/2019 1:53 PM     Darrell Jewel, RN  11/10/2019 1:42 PM EDT     Left a voicemail to call office back.   Thompson Grayer, MD  11/10/2019 9:48 AM EDT     Results reviewed. Otila Kluver, please inform pt of result. I will route to primary care also.  Given EF <40%, will start entresto. Sophina Mitten, please start on low dose entresto and titrate as patient tolerates through pharmacy clinic.

## 2019-11-10 NOTE — Telephone Encounter (Signed)
Called pt and scheduled appt with PharmD on 8/31 to discuss. He already takes Eliquis and will be the donut hole soon (price will increase from ~$45 to $120 per month) - Entresto would have similar pricing which will be cost prohibitive for pt. Will see if he qualifies for pt assistance through [employer] at his appt. If not, will plan to further titrate his lisinopril, metoprolol, and start spironolactone if able.  Pt also requests complete lab panel be checked at his appt so that his PCP doesn't have to. Will plan to check CBC, CMET, and lipid panel at appt, pt is aware to come fasting.

## 2019-11-11 ENCOUNTER — Telehealth: Payer: Self-pay | Admitting: *Deleted

## 2019-11-11 NOTE — Telephone Encounter (Signed)
Spoke to patients wife. Advised I didn't call them today. That we spoke yesterday but I didn't need anything else.

## 2019-11-11 NOTE — Telephone Encounter (Signed)
Patient is returning Tina's call.

## 2019-11-15 ENCOUNTER — Other Ambulatory Visit: Payer: Self-pay

## 2019-11-15 ENCOUNTER — Ambulatory Visit (INDEPENDENT_AMBULATORY_CARE_PROVIDER_SITE_OTHER): Payer: PPO | Admitting: Pharmacist

## 2019-11-15 VITALS — BP 140/80 | HR 58

## 2019-11-15 DIAGNOSIS — I5022 Chronic systolic (congestive) heart failure: Secondary | ICD-10-CM

## 2019-11-15 LAB — CBC
Hematocrit: 44.1 % (ref 37.5–51.0)
Hemoglobin: 14.8 g/dL (ref 13.0–17.7)
MCH: 30 pg (ref 26.6–33.0)
MCHC: 33.6 g/dL (ref 31.5–35.7)
MCV: 90 fL (ref 79–97)
Platelets: 209 10*3/uL (ref 150–450)
RBC: 4.93 x10E6/uL (ref 4.14–5.80)
RDW: 12.2 % (ref 11.6–15.4)
WBC: 7.9 10*3/uL (ref 3.4–10.8)

## 2019-11-15 LAB — COMPREHENSIVE METABOLIC PANEL
ALT: 29 IU/L (ref 0–44)
AST: 27 IU/L (ref 0–40)
Albumin/Globulin Ratio: 2.6 — ABNORMAL HIGH (ref 1.2–2.2)
Albumin: 4.5 g/dL (ref 3.7–4.7)
Alkaline Phosphatase: 32 IU/L — ABNORMAL LOW (ref 48–121)
BUN/Creatinine Ratio: 17 (ref 10–24)
BUN: 20 mg/dL (ref 8–27)
Bilirubin Total: 0.5 mg/dL (ref 0.0–1.2)
CO2: 24 mmol/L (ref 20–29)
Calcium: 9.5 mg/dL (ref 8.6–10.2)
Chloride: 104 mmol/L (ref 96–106)
Creatinine, Ser: 1.19 mg/dL (ref 0.76–1.27)
GFR calc Af Amer: 67 mL/min/{1.73_m2} (ref >59)
GFR calc non Af Amer: 58 mL/min/{1.73_m2} — ABNORMAL LOW (ref >59)
Globulin, Total: 1.7 g/dL (ref 1.5–4.5)
Glucose: 96 mg/dL (ref 65–99)
Potassium: 5.1 mmol/L (ref 3.5–5.2)
Sodium: 139 mmol/L (ref 134–144)
Total Protein: 6.2 g/dL (ref 6.0–8.5)

## 2019-11-15 LAB — PSA: Prostate Specific Ag, Serum: 3.2 ng/mL (ref 0.0–4.0)

## 2019-11-15 LAB — LIPID PANEL
Chol/HDL Ratio: 2.2 ratio (ref 0.0–5.0)
Cholesterol, Total: 138 mg/dL (ref 100–199)
HDL: 63 mg/dL (ref >39)
LDL Chol Calc (NIH): 62 mg/dL (ref 0–99)
Triglycerides: 61 mg/dL (ref 0–149)
VLDL Cholesterol Cal: 13 mg/dL (ref 5–40)

## 2019-11-15 MED ORDER — ENTRESTO 24-26 MG PO TABS: 1.0000 | ORAL_TABLET | Freq: Two times a day (BID) | ORAL | 11 refills | Status: DC

## 2019-11-15 NOTE — Patient Instructions (Addendum)
It was nice to meet you today!  I will submit your application for [facility] patient assistance and will call you once I get a response.  Start taking Entresto 24/26 tomorrow AM. Take one tablet by mouth twice a day. Do not take anymore lisinopril Continue metoprolol 12.5mg  daily  Please check your blood pressure daily and bring your log with you to your next appointment.  Before checking your blood pressure make sure: You are seated and quite for 5 min before checking Feet are flat on the floor Siting in chair with your back supported straight up and down Arm resting on table or arm of chair at heart level Bladder is empty You have NOT had caffeine or tobacco within the last 30 min  Call us at [phone] with any questions.

## 2019-11-15 NOTE — Progress Notes (Signed)
Patient ID: [name]                 DOB: September 20, 1940                      MRN: [id number]     HPI: [name] is a 79 y.o. male referred by Dr. Rayann Heman to pharmacy clinic for HF medication management. PMH is significant for aflutter, CAD, CKD, CHF, TIA and HTN . Most recent LVEF 30% on 11/08/19. EF was 35% 05/21/2018. Dr. Rayann Heman requested patient start on Entresto. Due to cost concern, we will discuss patient assistance with patient before starting.  Today he presents to pharmacy clinic for further medication titration. At last visit with MD EF was found to be lower than previous and it was recommended he switch from lisinopril to [facility]. Symptomatically, he is feeling great, denies dizziness, lightheadedness, and fatigue. No chest pain or palpitations. No SOB. Able to complete all ADLs. Activity level high.  No LEE, PND, or orthopnea. Appetite has been good. He adheres to a low sodium diet.  Patient requests that we do his annual screening labs including CBC, PSA, lipids, CMP so that his PCP does not have to at his physical. He is very active and does a lot of strenuous work around his yard. Patient states he is taking 1/2 of metoprolol succinate (12.5mg ). Rx was written by PCP for 25mg  daily.   Current CHF meds: lisinopril 10mg  daily, metoprolol succinate 12.5mg  daily BP goal: <130/80  Family History:  Family History  Problem Relation Age of Onset  . Breast cancer Mother   . Ulcers Father        stomach  . COPD Father   . Tuberculosis Father   . Colon polyps Brother   . Tuberculosis Brother   . Brain cancer Brother   . Colon cancer Neg Hx   . Stomach cancer Neg Hx   . Rectal cancer Neg Hx   . Esophageal cancer Neg Hx   . Liver cancer Neg Hx      Social History: The patient  reports that he quit smoking about 49 years ago. His smoking use included cigarettes. He has a 20.00 pack-year smoking history. He has never used smokeless tobacco. He reports that he does not drink  alcohol and does not use drugs.  Diet: wife doesn't cook with much salt, doesn't put much salt on things  Exercise: strenuous yard work, golf  Home BP readings: 120/70 sometimes 110/60 HR 57-60  Wt Readings from Last 3 Encounters:  10/19/19 170 lb (77.1 kg)  05/04/19 165 lb (74.8 kg)  01/20/19 172 lb (78 kg)   BP Readings from Last 3 Encounters:  10/19/19 118/63  01/20/19 140/80  08/26/18 105/66   Pulse Readings from Last 3 Encounters:  10/19/19 63  01/20/19 (!) 54  08/26/18 (!) 44    Renal function: CrCl cannot be calculated (Patient's most recent lab result is older than the maximum 21 days allowed.).  Past Medical History:  Diagnosis Date  . Atrial flutter ([place])    a. s/p CTI ablation x2 by Dr Rayann Heman 10/12, 9/13  . Basal cell carcinoma of cheek   . BPH (benign prostatic hyperplasia)   . CAD (coronary artery disease)    nonobstructive on cath 08/2018  . CHF (congestive heart failure) ([place])    ef 35% on Echo 2020  . CKD (chronic kidney disease) stage 3, GFR 30-59 ml/min   . Colon polyps   .  Diverticulosis   . Ectopic atrial tachycardia ([place])    a. Dx 07/2015: rate controlled   . Hemorrhoids   . Hyperlipidemia   . Hypertension   . Hypertrophic cardiomyopathy ([place])   . TIA (transient ischemic attack)     Current Outpatient Medications on File Prior to Visit  Medication Sig Dispense Refill  . atorvastatin (LIPITOR) 40 MG tablet TAKE 1 TABLET BY MOUTH EVERY DAY 90 tablet 1  . clindamycin (CLEOCIN) 300 MG capsule Take 2 capsules 1hour prior to dental procedure. 6 capsule 0  . Coenzyme Q10 (COQ10) 200 MG CAPS Take 200 mg by mouth every evening.    Marland Kitchen ELIQUIS 5 MG TABS tablet TAKE 1 TABLET BY MOUTH TWICE A DAY 180 tablet 1  . ezetimibe (ZETIA) 10 MG tablet Take 1 tablet (10 mg total) by mouth daily. 90 tablet 3  . lisinopril (ZESTRIL) 10 MG tablet TAKE 1 TABLET BY MOUTH EVERY DAY IN THE EVENING 90 tablet 1  . Melatonin 5 MG TABS Take 5 mg by mouth at bedtime.    .  methylPREDNISolone (MEDROL DOSEPAK) 4 MG TBPK tablet Take as directed on package. (Patient not taking: Reported on 10/19/2019) 21 tablet 0  . metoprolol succinate (TOPROL-XL) 25 MG 24 hr tablet TAKE 1 TABLET BY MOUTH EVERY DAY 90 tablet 3   Current Facility-Administered Medications on File Prior to Visit  Medication Dose Route Frequency Provider Last Rate Last Admin  . 0.9 %  sodium chloride infusion  500 mL Intravenous Continuous Pyrtle, Lajuan Lines, MD        Allergies  Allergen Reactions  . Nsaids Other (See Comments)    GI  Upset   . Crestor [Rosuvastatin Calcium] Other (See Comments)    Joint pain  . Penicillins Other (See Comments)    Unknown reaction Did it involve swelling of the face/tongue/throat, SOB, or low BP? Unknown Did it involve sudden or severe rash/hives, skin peeling, or any reaction on the inside of your mouth or nose? Unknown Did you need to seek medical attention at a hospital or doctor's office? Unknown When did it last happen? More than 50 years ago If all above answers are "NO", may proceed with cephalosporin use.      Assessment/Plan:  1. CHF - patient is interested in switching to [place]. He has not taken his lisinopril today. Last took last night. Advised patient to take no more lisinopril. Start Entresto 24-26mg  BID starting tomorrow AM. He will start monitoring his blood pressure on a consistent basis and bring his log with him to next appointment. Patient filled out patient assistance paperwork while in the office. Will submit today. Discussed with patient the goal of getting him on as close to target doses as possible and the lower range of acceptable BP and HR. His HR is typically <60, therefore no room to titrate metoprolol. Continue metoprolol 12.5mg  daily. CMP today and will repeat in 2 weeks. Follow up in office in 2 weeks.  Thank you,  Ramond Dial, Pharm.D, BCPS, CPP [place]  [address] [place], [place] [address]    Phone: [phone]; Fax: [phone]

## 2019-11-17 ENCOUNTER — Telehealth: Payer: Self-pay | Admitting: Pharmacist

## 2019-11-17 NOTE — Telephone Encounter (Signed)
Spoke to patient and gave him the information below. Patient appreciative of all the help.

## 2019-11-17 NOTE — Telephone Encounter (Signed)
Patient approved for [employer] Pt assistance for [employer].  Called pt and left message with wife to call back Patient should call [phone] if he has not heard from [facility] about setting up shipment in a few days

## 2019-11-24 ENCOUNTER — Other Ambulatory Visit: Payer: Self-pay

## 2019-11-24 ENCOUNTER — Ambulatory Visit (INDEPENDENT_AMBULATORY_CARE_PROVIDER_SITE_OTHER): Payer: PPO | Admitting: Family Medicine

## 2019-11-24 VITALS — BP 118/80 | HR 72 | Temp 95.8°F | Ht 70.0 in | Wt 171.0 lb

## 2019-11-24 DIAGNOSIS — I10 Essential (primary) hypertension: Secondary | ICD-10-CM

## 2019-11-24 DIAGNOSIS — I5020 Unspecified systolic (congestive) heart failure: Secondary | ICD-10-CM

## 2019-11-24 DIAGNOSIS — Z8673 Personal history of transient ischemic attack (TIA), and cerebral infarction without residual deficits: Secondary | ICD-10-CM

## 2019-11-24 DIAGNOSIS — I2510 Atherosclerotic heart disease of native coronary artery without angina pectoris: Secondary | ICD-10-CM | POA: Diagnosis not present

## 2019-11-24 DIAGNOSIS — I4892 Unspecified atrial flutter: Secondary | ICD-10-CM

## 2019-11-24 DIAGNOSIS — E785 Hyperlipidemia, unspecified: Secondary | ICD-10-CM

## 2019-11-24 NOTE — Progress Notes (Signed)
Subjective:    Patient ID: [name], male    DOB: 01/12/1941, 79 y.o.   MRN: [id number]   Patient is a very pleasant 79 year old Caucasian male with a history of atrial flutter status post ablation x2, nonobstructive coronary artery disease, and congestive heart failure with an ejection fraction of 35% on echocardiogram in 2020.  Patient is completely asymptomatic.  He denies any angina.  He denies any dyspnea on exertion.  He denies any orthopnea or paroxysmal nocturnal dyspnea.  However on his most recent echocardiogram, his ejection fraction had declined to 30% despite medical therapy.  He denies any palpitations or syncope or near syncope.  I performed an EKG today and his QRS interval is greater than 144 ms.  Patient has yet to see Dr. Rayann Heman in the office due to COVID-19 restrictions.  He has an appointment to see the pharmacist next week Past Medical History:  Diagnosis Date  . Atrial flutter ([place])    a. s/p CTI ablation x2 by Dr Rayann Heman 10/12, 9/13  . Basal cell carcinoma of cheek   . BPH (benign prostatic hyperplasia)   . CAD (coronary artery disease)    nonobstructive on cath 08/2018  . CHF (congestive heart failure) ([place])    ef 35% on Echo 2020  . CKD (chronic kidney disease) stage 3, GFR 30-59 ml/min   . Colon polyps   . Diverticulosis   . Ectopic atrial tachycardia ([place])    a. Dx 07/2015: rate controlled   . Hemorrhoids   . Hyperlipidemia   . Hypertension   . Hypertrophic cardiomyopathy ([place])   . TIA (transient ischemic attack)    Past Surgical History:  Procedure Laterality Date  . ATRIAL ABLATION SURGERY  10/12   CTI ablation for atrial flutter by Dr Rayann Heman  . ATRIAL FLUTTER ABLATION N/A 12/11/2011   CTI ablation for atrial flutter by Dr Rayann Heman  . BASAL CELL CARCINOMA EXCISION  ~ 1973   left cheek  . CARDIAC CATHETERIZATION  1960's  . HERNIA REPAIR  ~ 1761   umbilical  . JOINT REPLACEMENT    . RIGHT/LEFT HEART CATH AND CORONARY ANGIOGRAPHY N/A 08/26/2018    Procedure: RIGHT/LEFT HEART CATH AND CORONARY ANGIOGRAPHY;  Surgeon: Sherren Mocha, MD;  Location: [facility];  Service: Cardiovascular;  Laterality: N/A;  . TONSILLECTOMY AND ADENOIDECTOMY  1947  . TOTAL HIP ARTHROPLASTY     left   Current Outpatient Medications on File Prior to Visit  Medication Sig Dispense Refill  . atorvastatin (LIPITOR) 40 MG tablet TAKE 1 TABLET BY MOUTH EVERY DAY 90 tablet 1  . clindamycin (CLEOCIN) 300 MG capsule Take 2 capsules 1hour prior to dental procedure. 6 capsule 0  . Coenzyme Q10 (COQ10) 200 MG CAPS Take 200 mg by mouth every evening.    Marland Kitchen ELIQUIS 5 MG TABS tablet TAKE 1 TABLET BY MOUTH TWICE A DAY 180 tablet 1  . ezetimibe (ZETIA) 10 MG tablet Take 1 tablet (10 mg total) by mouth daily. 90 tablet 3  . Melatonin 5 MG TABS Take 5 mg by mouth at bedtime.    . metoprolol succinate (TOPROL-XL) 25 MG 24 hr tablet TAKE 1 TABLET BY MOUTH EVERY DAY (Patient taking differently: 12.5 mg. ) 90 tablet 3  . sacubitril-valsartan (ENTRESTO) 24-26 MG Take 1 tablet by mouth 2 (two) times daily. 60 tablet 11   Current Facility-Administered Medications on File Prior to Visit  Medication Dose Route Frequency Provider Last Rate Last Admin  . 0.9 %  sodium chloride infusion  500 mL Intravenous Continuous Pyrtle, Lajuan Lines, MD       Allergies  Allergen Reactions  . Nsaids Other (See Comments)    GI  Upset   . Crestor [Rosuvastatin Calcium] Other (See Comments)    Joint pain  . Penicillins Other (See Comments)    Unknown reaction Did it involve swelling of the face/tongue/throat, SOB, or low BP? Unknown Did it involve sudden or severe rash/hives, skin peeling, or any reaction on the inside of your mouth or nose? Unknown Did you need to seek medical attention at a hospital or doctor's office? Unknown When did it last happen? More than 50 years ago If all above answers are "NO", may proceed with cephalosporin use.    Social History   Socioeconomic History  .  Marital status: Married    Spouse name: Not on file  . Number of children: 2  . Years of education: Not on file  . Highest education level: Not on file  Occupational History  . Occupation: retired  Tobacco Use  . Smoking status: Former Smoker    Packs/day: 2.00    Years: 10.00    Pack years: 20.00    Types: Cigarettes    Quit date: 03/17/1970    Years since quitting: 49.7  . Smokeless tobacco: Never Used  Vaping Use  . Vaping Use: Never used  Substance and Sexual Activity  . Alcohol use: No    Comment: quit 1995  . Drug use: No  . Sexual activity: Yes  Other Topics Concern  . Not on file  Social History Narrative   Lives in [place] with spouse.  2 Grown children.  Retired from Data processing manager   Social Determinants of Radio broadcast assistant Strain: [place]   . Difficulty of Paying Living Expenses: Not hard at all  Food Insecurity:   . Worried About Charity fundraiser in the Last Year: Not on file  . Ran Out of Food in the Last Year: Not on file  Transportation Needs:   . Lack of Transportation (Medical): Not on file  . Lack of Transportation (Non-Medical): Not on file  Physical Activity:   . Days of Exercise per Week: Not on file  . Minutes of Exercise per Session: Not on file  Stress:   . Feeling of Stress : Not on file  Social Connections:   . Frequency of Communication with Friends and Family: Not on file  . Frequency of Social Gatherings with Friends and Family: Not on file  . Attends Religious Services: Not on file  . Active Member of Clubs or Organizations: Not on file  . Attends Archivist Meetings: Not on file  . Marital Status: Not on file  Intimate Partner Violence:   . Fear of Current or Ex-Partner: Not on file  . Emotionally Abused: Not on file  . Physically Abused: Not on file  . Sexually Abused: Not on file   Family History  Problem Relation Age of Onset  . Breast cancer Mother   . Ulcers Father        stomach  .  COPD Father   . Tuberculosis Father   . Colon polyps Brother   . Tuberculosis Brother   . Brain cancer Brother   . Colon cancer Neg Hx   . Stomach cancer Neg Hx   . Rectal cancer Neg Hx   . Esophageal cancer Neg Hx   . Liver cancer Neg Hx  Review of Systems  All other systems reviewed and are negative.      Objective:   Physical Exam Vitals reviewed.  Constitutional:      General: He is not in acute distress.    Appearance: He is well-developed. He is not diaphoretic.  HENT:     Head: Normocephalic and atraumatic.     Right Ear: External ear normal.     Left Ear: External ear normal.     Nose: Nose normal.     Mouth/Throat:     Pharynx: No oropharyngeal exudate.  Eyes:     General: No scleral icterus.       Right eye: No discharge.        Left eye: No discharge.     Conjunctiva/sclera: Conjunctivae normal.     Pupils: Pupils are equal, round, and reactive to light.  Neck:     Thyroid: No thyromegaly.     Vascular: No JVD.     Trachea: No tracheal deviation.  Cardiovascular:     Rate and Rhythm: Normal rate and regular rhythm.     Heart sounds: Normal heart sounds. No murmur heard.  No friction rub. No gallop.   Pulmonary:     Effort: Pulmonary effort is normal. No respiratory distress.     Breath sounds: Normal breath sounds. No stridor. No wheezing or rales.  Chest:     Chest wall: No tenderness.  Abdominal:     General: Bowel sounds are normal. There is no distension.     Palpations: Abdomen is soft. There is no mass.     Tenderness: There is no abdominal tenderness. There is no guarding or rebound.  Musculoskeletal:        General: No tenderness. Normal range of motion.     Cervical back: Normal range of motion and neck supple.  Lymphadenopathy:     Cervical: No cervical adenopathy.  Skin:    General: Skin is warm.     Coloration: Skin is not pale.     Findings: No erythema or rash.  Neurological:     Mental Status: He is alert and oriented to  person, place, and time.     Cranial Nerves: No cranial nerve deficit.     Motor: No abnormal muscle tone.     Coordination: Coordination normal.     Deep Tendon Reflexes: Reflexes are normal and symmetric.  Psychiatric:        Behavior: Behavior normal.        Thought Content: Thought content normal.        Judgment: Judgment normal.      Office Visit on 11/15/2019  Component Date Value Ref Range Status  . Prostate Specific Ag, Serum 11/15/2019 3.2  0.0 - 4.0 ng/mL Final   Comment: Roche ECLIA methodology. According to the American Urological Association, Serum PSA should decrease and remain at undetectable levels after radical prostatectomy. The AUA defines biochemical recurrence as an initial PSA value 0.2 ng/mL or greater followed by a subsequent confirmatory PSA value 0.2 ng/mL or greater. Values obtained with different assay methods or kits cannot be used interchangeably. Results cannot be interpreted as absolute evidence of the presence or absence of malignant disease.   . WBC 11/15/2019 7.9  3.4 - 10.8 x10E3/uL Final  . RBC 11/15/2019 4.93  4.14 - 5.80 x10E6/uL Final  . Hemoglobin 11/15/2019 14.8  13.0 - 17.7 g/dL Final  . Hematocrit 11/15/2019 44.1  37.5 - 51.0 % Final  . MCV 11/15/2019 90  79 - 97 fL Final  . MCH 11/15/2019 30.0  26.6 - 33.0 pg Final  . MCHC 11/15/2019 33.6  31 - 35 g/dL Final  . RDW 11/15/2019 12.2  11.6 - 15.4 % Final  . Platelets 11/15/2019 209  150 - 450 x10E3/uL Final  . Glucose 11/15/2019 96  65 - 99 mg/dL Final  . BUN 11/15/2019 20  8 - 27 mg/dL Final  . Creatinine, Ser 11/15/2019 1.19  0.76 - 1.27 mg/dL Final  . GFR calc non Af Amer 11/15/2019 58* >59 mL/min/1.73 Final  . GFR calc Af Amer 11/15/2019 67  >59 mL/min/1.73 Final   Comment: **[facility] currently reports eGFR in compliance with the current**   recommendations of the [employer]. [facility] will   update reporting as new guidelines are published from the NKF-ASN   Task  force.   . BUN/Creatinine Ratio 11/15/2019 17  10 - 24 Final  . Sodium 11/15/2019 139  134 - 144 mmol/L Final  . Potassium 11/15/2019 5.1  3.5 - 5.2 mmol/L Final  . Chloride 11/15/2019 104  96 - 106 mmol/L Final  . CO2 11/15/2019 24  20 - 29 mmol/L Final  . Calcium 11/15/2019 9.5  8.6 - 10.2 mg/dL Final  . Total Protein 11/15/2019 6.2  6.0 - 8.5 g/dL Final  . Albumin 11/15/2019 4.5  3.7 - 4.7 g/dL Final  . Globulin, Total 11/15/2019 1.7  1.5 - 4.5 g/dL Final  . Albumin/Globulin Ratio 11/15/2019 2.6* 1.2 - 2.2 Final  . Bilirubin Total 11/15/2019 0.5  0.0 - 1.2 mg/dL Final  . Alkaline Phosphatase 11/15/2019 32* 48 - 121 IU/L Final  . AST 11/15/2019 27  0 - 40 IU/L Final  . ALT 11/15/2019 29  0 - 44 IU/L Final  . Cholesterol, Total 11/15/2019 138  100 - 199 mg/dL Final  . Triglycerides 11/15/2019 61  0 - 149 mg/dL Final  . HDL 11/15/2019 63  >39 mg/dL Final  . VLDL Cholesterol Cal 11/15/2019 13  5 - 40 mg/dL Final  . LDL Chol Calc (NIH) 11/15/2019 62  0 - 99 mg/dL Final  . Chol/HDL Ratio 11/15/2019 2.2  0.0 - 5.0 ratio Final   Comment:                                   T. Chol/HDL Ratio                                             Men  Women                               1/2 Avg.Risk  3.4    3.3                                   Avg.Risk  5.0    4.4                                2X Avg.Risk  9.6    7.1  3X Avg.Risk 23.4   11.0   Appointment on 11/08/2019  Component Date Value Ref Range Status  . Area-P 1/2 11/08/2019 2.54  cm2 Final  . S' Lateral 11/08/2019 3.80  cm Final        Assessment & Plan:  Hyperlipidemia, unspecified hyperlipidemia type  Hypertension, essential  Systolic congestive heart failure, unspecified HF chronicity ([place]) - Plan: BASIC METABOLIC PANEL WITH GFR, EKG 12-Lead  Coronary artery disease involving native coronary artery of native heart without angina pectoris  Benign essential HTN  Hx-TIA (transient ischemic  attack)  Atrial flutter, unspecified type ([place])  I am extremely happy with the patient's lab work.  I will repeat a BMP today to monitor his potassium since starting Entresto and switching off lisinopril.  Patient's cholesterol is excellent and his LDL cholesterol is now below 70 which is his goal.  PSA is only marginally increased.  Immunizations are up-to-date.  I recommended getting his booster on his Covid shot before getting his flu shot.  Blood pressure today is well controlled.  However the patient's QRS interval is greater than 120 ms and his ejection fraction is less than 35%.  Therefore I would like to schedule the patient to meet with Dr. Rayann Heman to determine if Dr. Rayann Heman feels that a biventricular pacer/ICD is warranted.  This is far beyond my experience and I would appreciate Dr. Jackalyn Lombard expert opinion on this.  Patient is doing extremely well with no symptoms and therefore, this may not be necessary.

## 2019-11-25 LAB — BASIC METABOLIC PANEL WITH GFR
BUN/Creatinine Ratio: 22 (calc) (ref 6–22)
BUN: 26 mg/dL — ABNORMAL HIGH (ref 7–25)
CO2: 25 mmol/L (ref 20–32)
Calcium: 9.1 mg/dL (ref 8.6–10.3)
Chloride: 105 mmol/L (ref 98–110)
Creat: 1.2 mg/dL — ABNORMAL HIGH (ref 0.70–1.18)
GFR, Est African American: 66 mL/min/{1.73_m2} (ref >=60)
GFR, Est Non African American: 57 mL/min/{1.73_m2} — ABNORMAL LOW (ref >=60)
Glucose, Bld: 99 mg/dL (ref 65–99)
Potassium: 4.6 mmol/L (ref 3.5–5.3)
Sodium: 138 mmol/L (ref 135–146)

## 2019-11-30 NOTE — Progress Notes (Signed)
Patient ID: WARWICK NICK                 DOB: [birth year]                      MRN: [id number]     HPI: [name] is a 79 y.o. male referred by Dr. Rayann Heman to pharmacy clinic for HF medication management. PMH is significant for A flutter, CAD, CKD, CHF, TIA and HTN. Most recent LVEF 30% on 11/08/19. EF was 35% 05/21/2018.  Patient was last seen at pharmacy clinic on 11/15/2019. At that time patient was switched from lisinopril to Entresto 24/26 mg BID. Labs drawn on 11/24/2019 were stable. Patient returns today for follow-up visit. He is tolerating Entresto well. Denies dizziness, lightheadedness, fatigue, SOB, swelling, or weight gain. Patient reports SOB/fatigue one day (11/24/19) while he was playing golf, but this has not occurred again since then.  Home systolic blood pressures have been in 630Z-601U and diastolic pressures in 93A-35T. He has had 2 lower blood pressures (90 systolic) but was asymptomatic. Office BP today was 134/78; was 124/78 on repeat after resting a little longer. Patient took his medications this morning.  Current CHF meds: Entresto 24/26 mg twice daily, metoprolol succinate 12.5 mg daily Previously tried: Lisinopril 10 mg daily BP goal: <130/80  Family History:       Family History  Problem Relation Age of Onset  . Breast cancer Mother   . Ulcers Father        stomach  . COPD Father   . Tuberculosis Father   . Colon polyps Brother   . Tuberculosis Brother   . Brain cancer Brother   . Colon cancer Neg Hx   . Stomach cancer Neg Hx   . Rectal cancer Neg Hx   . Esophageal cancer Neg Hx   . Liver cancer Neg Hx    Social History: Reports quitting smoking about 49 years ago. His smoking use included cigarettes. He has a 20.00 pack-year smoking history. He has never used smokeless tobacco. He reports that he does not drink alcohol and does not use drugs.  Diet: Wife doesn't cook with much salt, doesn't put much salt on things. Tries to drink more water  than before.  Exercise: Light yard work, golf  Home BP readings: 110/71, 114/66, 119/68, 100/61, 103/52, 120/72, 115/66, 129/70, 101/57, 100/52, 110/65, 107/64, 128/77, 130/76; one reading was 90/51 on 11/19/19 but patient felt fine  HR in 50s-60s, sometimes in low 70s   Wt Readings from Last 3 Encounters:  11/24/19 171 lb (77.6 kg)  10/19/19 170 lb (77.1 kg)  05/04/19 165 lb (74.8 kg)   BP Readings from Last 3 Encounters:  11/24/19 118/80  11/15/19 140/80  10/19/19 118/63   Pulse Readings from Last 3 Encounters:  11/24/19 72  11/15/19 (!) 58  10/19/19 63    Renal function: Estimated Creatinine Clearance: 51.5 mL/min (A) (by C-G formula based on SCr of 1.2 mg/dL (H)).  Past Medical History:  Diagnosis Date  . Atrial flutter ([place])    a. s/p CTI ablation x2 by Dr Rayann Heman 10/12, 9/13  . Basal cell carcinoma of cheek   . BPH (benign prostatic hyperplasia)   . CAD (coronary artery disease)    nonobstructive on cath 08/2018  . CHF (congestive heart failure) ([place])    ef 35% on Echo 2020  . CKD (chronic kidney disease) stage 3, GFR 30-59 ml/min   . Colon polyps   .  Diverticulosis   . Ectopic atrial tachycardia ([place])    a. Dx 07/2015: rate controlled   . Hemorrhoids   . Hyperlipidemia   . Hypertension   . Hypertrophic cardiomyopathy ([place])   . TIA (transient ischemic attack)     Current Outpatient Medications on File Prior to Visit  Medication Sig Dispense Refill  . atorvastatin (LIPITOR) 40 MG tablet TAKE 1 TABLET BY MOUTH EVERY DAY 90 tablet 1  . clindamycin (CLEOCIN) 300 MG capsule Take 2 capsules 1hour prior to dental procedure. 6 capsule 0  . Coenzyme Q10 (COQ10) 200 MG CAPS Take 200 mg by mouth every evening.    Marland Kitchen ELIQUIS 5 MG TABS tablet TAKE 1 TABLET BY MOUTH TWICE A DAY 180 tablet 1  . ezetimibe (ZETIA) 10 MG tablet Take 1 tablet (10 mg total) by mouth daily. 90 tablet 3  . Melatonin 5 MG TABS Take 5 mg by mouth at bedtime.    . metoprolol succinate (TOPROL-XL) 25  MG 24 hr tablet TAKE 1 TABLET BY MOUTH EVERY DAY (Patient taking differently: 12.5 mg. ) 90 tablet 3  . sacubitril-valsartan (ENTRESTO) 24-26 MG Take 1 tablet by mouth 2 (two) times daily. 60 tablet 11   Current Facility-Administered Medications on File Prior to Visit  Medication Dose Route Frequency Provider Last Rate Last Admin  . 0.9 %  sodium chloride infusion  500 mL Intravenous Continuous Pyrtle, Lajuan Lines, MD        Allergies  Allergen Reactions  . Nsaids Other (See Comments)    GI  Upset   . Crestor [Rosuvastatin Calcium] Other (See Comments)    Joint pain  . Penicillins Other (See Comments)    Unknown reaction Did it involve swelling of the face/tongue/throat, SOB, or low BP? Unknown Did it involve sudden or severe rash/hives, skin peeling, or any reaction on the inside of your mouth or nose? Unknown Did you need to seek medical attention at a hospital or doctor's office? Unknown When did it last happen? More than 50 years ago If all above answers are "NO", may proceed with cephalosporin use.      Assessment/Plan:  1. CHF - Patient's BP is within goal of <130/80 but patient is not yet on optimized GDMT for heart failure. Patient's systolic blood pressures are generally in 110s-120s, but sometimes drops to low 100s and had one reading in 90s early in the month. We discussed with patient the option of increasing Entresto to 49/51 dose to try to optimize Entresto dose vs adding spironolactone.  Patient was hesitant and wanted to try to increase Entresto dose by 50% rather than doubling it. We will increase Entresto 24/26 to 1 tablet in the morning and 2 tablets in the evening to see how he tolerates this. Counseled patient to monitor for low blood pressure readings, as well as dizziness, lightheadedness, or weakness with dose increase. No change to metoprolol dose due to HR already in the 60's and high 50's in clinic.  Patient will see Dr. Rayann Heman in 1 week. We will draw labs then,  then follow up with patient back in clinic in 2 weeks.   Esmeralda Links (PharmD Candidate 2022)  Ramond Dial, Pharm.D, BCPS, CPP [place]  [address], [place], [place] [address]  Phone: [phone]; Fax: [phone]

## 2019-12-01 ENCOUNTER — Ambulatory Visit (INDEPENDENT_AMBULATORY_CARE_PROVIDER_SITE_OTHER): Payer: PPO | Admitting: Pharmacist

## 2019-12-01 ENCOUNTER — Other Ambulatory Visit: Payer: Self-pay

## 2019-12-01 VITALS — BP 124/78 | HR 55

## 2019-12-01 DIAGNOSIS — I5022 Chronic systolic (congestive) heart failure: Secondary | ICD-10-CM

## 2019-12-01 NOTE — Patient Instructions (Signed)
Great to see you today!  We will INCREASE your Entresto 24/26 mg to 1 tablet in the morning and 2 tablets in the evening. Please let us know if you feel any dizziness, lightheadedness, or weakness. Continue to monitor your blood pressure at home. We will check your labs when you see Dr. Rayann Heman next week.  CONTINUE Toprol XL 12.5 mg daily.  We will see you back in clinic in 2 weeks.   Please call us if you have any questions or concerns: [phone]

## 2019-12-07 ENCOUNTER — Other Ambulatory Visit: Payer: Self-pay

## 2019-12-07 ENCOUNTER — Telehealth (HOSPITAL_COMMUNITY): Payer: Self-pay | Admitting: *Deleted

## 2019-12-07 ENCOUNTER — Other Ambulatory Visit: Payer: PPO

## 2019-12-07 ENCOUNTER — Encounter: Payer: Self-pay | Admitting: Internal Medicine

## 2019-12-07 ENCOUNTER — Ambulatory Visit: Payer: PPO | Admitting: Internal Medicine

## 2019-12-07 VITALS — BP 124/68 | HR 96 | Ht 70.0 in | Wt 172.8 lb

## 2019-12-07 DIAGNOSIS — I5022 Chronic systolic (congestive) heart failure: Secondary | ICD-10-CM

## 2019-12-07 DIAGNOSIS — I428 Other cardiomyopathies: Secondary | ICD-10-CM | POA: Diagnosis not present

## 2019-12-07 DIAGNOSIS — I4510 Unspecified right bundle-branch block: Secondary | ICD-10-CM

## 2019-12-07 LAB — BASIC METABOLIC PANEL
BUN/Creatinine Ratio: 14 (ref 10–24)
BUN: 17 mg/dL (ref 8–27)
CO2: 23 mmol/L (ref 20–29)
Calcium: 9.4 mg/dL (ref 8.6–10.2)
Chloride: 104 mmol/L (ref 96–106)
Creatinine, Ser: 1.18 mg/dL (ref 0.76–1.27)
GFR calc Af Amer: 67 mL/min/{1.73_m2} (ref >59)
GFR calc non Af Amer: 58 mL/min/{1.73_m2} — ABNORMAL LOW (ref >59)
Glucose: 112 mg/dL — ABNORMAL HIGH (ref 65–99)
Potassium: 4.2 mmol/L (ref 3.5–5.2)
Sodium: 141 mmol/L (ref 134–144)

## 2019-12-07 NOTE — Patient Instructions (Addendum)
Medication Instructions:  Your physician recommends that you continue on your current medications as directed. Please refer to the Current Medication list given to you today.  *If you need a refill on your cardiac medications before your next appointment, please call your pharmacy*  Lab Work: Myeloma screen   If you have labs (blood work) drawn today and your tests are completely normal, you will receive your results only by: Marland Kitchen MyChart Message (if you have MyChart) OR . A paper copy in the mail If you have any lab test that is abnormal or we need to change your treatment, we will call you to review the results.  Testing/Procedures: PYP scan Please schedule   Follow-Up: At [facility], you and your health needs are our priority.  As part of our continuing mission to provide you with exceptional heart care, we have created designated Provider Care Teams.  These Care Teams include your primary Cardiologist (physician) and Advanced Practice Providers (APPs -  Physician Assistants and Nurse Practitioners) who all work together to provide you with the care you need, when you need it.  We recommend signing up for the patient portal called "MyChart".  Sign up information is provided on this After Visit Summary.  MyChart is used to connect with patients for Virtual Visits (Telemedicine).  Patients are able to view lab/test results, encounter notes, upcoming appointments, etc.  Non-urgent messages can be sent to your provider as well.   To learn more about what you can do with MyChart, go to [email or web address].    Your next appointment:   Your physician wants you to follow-up in: 02/06/20 at 11am with Dr. Rayann Heman.

## 2019-12-07 NOTE — Telephone Encounter (Signed)
Patient given detailed instructions per Amyloid Study Information for the test on 12/14/2019 at 1100. Patient notified to arrive 15 minutes early and that it is imperative to arrive on time for appointment to keep from having the test rescheduled.  If you need to cancel or reschedule your appointment, please call the office within 24 hours of your appointment. . Patient verbalized understanding.[name], [name]

## 2019-12-07 NOTE — Progress Notes (Signed)
PCP: Susy Frizzle, MD  Primary EP: Dr Kinnie Scales is a 79 y.o. male who presents today for routine electrophysiology followup.  Since last being seen in our clinic, the patient reports doing very well.  He has had several episodes on the golf coarse where he has been tired/ fatigue.   He denies presyncope/ syncope.  He has started entresto and is doing well with this.  Today, he denies symptoms of palpitations, chest pain, shortness of breath,  lower extremity edema, dizziness, presyncope, or syncope.  The patient is otherwise without complaint today.   Past Medical History:  Diagnosis Date  . Atrial flutter ([place])    a. s/p CTI ablation x2 by Dr Rayann Heman 10/12, 9/13  . Basal cell carcinoma of cheek   . BPH (benign prostatic hyperplasia)   . CAD (coronary artery disease)    nonobstructive on cath 08/2018  . CHF (congestive heart failure) ([place])    ef 35% on Echo 2020  . CKD (chronic kidney disease) stage 3, GFR 30-59 ml/min   . Colon polyps   . Diverticulosis   . Ectopic atrial tachycardia ([place])    a. Dx 07/2015: rate controlled   . Hemorrhoids   . Hyperlipidemia   . Hypertension   . Hypertrophic cardiomyopathy ([place])   . TIA (transient ischemic attack)    Past Surgical History:  Procedure Laterality Date  . ATRIAL ABLATION SURGERY  10/12   CTI ablation for atrial flutter by Dr Rayann Heman  . ATRIAL FLUTTER ABLATION N/A 12/11/2011   CTI ablation for atrial flutter by Dr Rayann Heman  . BASAL CELL CARCINOMA EXCISION  ~ 1973   left cheek  . CARDIAC CATHETERIZATION  1960's  . HERNIA REPAIR  ~ 2979   umbilical  . JOINT REPLACEMENT    . RIGHT/LEFT HEART CATH AND CORONARY ANGIOGRAPHY N/A 08/26/2018   Procedure: RIGHT/LEFT HEART CATH AND CORONARY ANGIOGRAPHY;  Surgeon: Sherren Mocha, MD;  Location: [facility];  Service: Cardiovascular;  Laterality: N/A;  . TONSILLECTOMY AND ADENOIDECTOMY  1947  . TOTAL HIP ARTHROPLASTY     left    ROS- all systems are reviewed and  negatives except as per HPI above  Current Outpatient Medications  Medication Sig Dispense Refill  . atorvastatin (LIPITOR) 40 MG tablet TAKE 1 TABLET BY MOUTH EVERY DAY 90 tablet 1  . clindamycin (CLEOCIN) 300 MG capsule Take 2 capsules 1hour prior to dental procedure. 6 capsule 0  . Coenzyme Q10 (COQ10) 200 MG CAPS Take 200 mg by mouth every evening.    Marland Kitchen ELIQUIS 5 MG TABS tablet TAKE 1 TABLET BY MOUTH TWICE A DAY 180 tablet 1  . ezetimibe (ZETIA) 10 MG tablet Take 1 tablet (10 mg total) by mouth daily. 90 tablet 3  . Melatonin 5 MG TABS Take 5 mg by mouth at bedtime.    . metoprolol succinate (TOPROL-XL) 25 MG 24 hr tablet TAKE 1 TABLET BY MOUTH EVERY DAY 90 tablet 3  . sacubitril-valsartan (ENTRESTO) 24-26 MG Take one tablet by mouth in the morning and take two tablets by mouth in the evening. 60 tablet 11   Current Facility-Administered Medications  Medication Dose Route Frequency Provider Last Rate Last Admin  . 0.9 %  sodium chloride infusion  500 mL Intravenous Continuous Pyrtle, Lajuan Lines, MD        Physical Exam: Vitals:   12/07/19 0925  BP: 124/68  Pulse: 96  SpO2: 96%  Weight: 172 lb 12.8 oz (78.4  kg)  Height: 5\' 10"  (1.778 m)    GEN- The patient is well appearing, alert and oriented x 3 today.   Head- normocephalic, atraumatic Eyes-  Sclera clear, conjunctiva pink Ears- hearing intact Oropharynx- clear Lungs-  normal work of breathing Heart- Regular rate and rhythm  GI- soft  Extremities- no clubbing, cyanosis, or edema  Wt Readings from Last 3 Encounters:  12/07/19 172 lb 12.8 oz (78.4 kg)  11/24/19 171 lb (77.6 kg)  10/19/19 170 lb (77.1 kg)    EKG tracing ordered today is personally reviewed and shows sinus rhythm with first degree AV block/ RBBB/ LAD  Assessment and Plan:  1. Nonischemic CM EF 30% by echo 11/08/19 He has been started on entresto.  We will continue to titrate this. He remains NYHA Class II currently PYP scan is ordered today as well as  myeloma panel  I will refer to Dr Gasper Sells for further cardiology evaluation of nonischemic CM and medicine optimization. Ultimately, CRT would be a good option for him.  We discussed this at length today.  Given advanced age, he and I would both lean towards CRT-P rather than CRT-D.  2. First degree AV block/ RBBB Chronic conduction system disease is noted I would not advise that we NOT increase his beta blocker We discussed pacing at length today.  Risks, benefits, alternatives to bivententricular pacemaker implantation were discussed in detail with the patient and his wife today. The patient understands that the risks include but are not limited to bleeding, infection, pneumothorax, perforation, tamponade, vascular damage, renal failure, MI, stroke, death,  and lead dislodgement and wishes to think about this further.  He will contact my office if he decides to proceed.  He states that he may eventually consider CRT depending on response to medical therapy (entresto recently started)  3. HTN Stable No change required today  4. Severe LA enlargement/ prior atrial flutter Continue long term anticoagulation  Risks, benefits and potential toxicities for medications prescribed and/or refilled reviewed with patient today.   Refer to Dr Gasper Sells for general cardiology consultation Return to see me in 2 months to further consider pacing.  Thompson Grayer MD, [facility] 12/07/2019 9:49 AM

## 2019-12-08 ENCOUNTER — Telehealth: Payer: Self-pay

## 2019-12-08 DIAGNOSIS — L814 Other melanin hyperpigmentation: Secondary | ICD-10-CM | POA: Diagnosis not present

## 2019-12-08 DIAGNOSIS — L905 Scar conditions and fibrosis of skin: Secondary | ICD-10-CM | POA: Diagnosis not present

## 2019-12-08 DIAGNOSIS — D229 Melanocytic nevi, unspecified: Secondary | ICD-10-CM | POA: Diagnosis not present

## 2019-12-08 DIAGNOSIS — Z85820 Personal history of malignant melanoma of skin: Secondary | ICD-10-CM | POA: Diagnosis not present

## 2019-12-08 DIAGNOSIS — L820 Inflamed seborrheic keratosis: Secondary | ICD-10-CM | POA: Diagnosis not present

## 2019-12-08 DIAGNOSIS — L819 Disorder of pigmentation, unspecified: Secondary | ICD-10-CM | POA: Diagnosis not present

## 2019-12-08 DIAGNOSIS — D485 Neoplasm of uncertain behavior of skin: Secondary | ICD-10-CM | POA: Diagnosis not present

## 2019-12-08 DIAGNOSIS — L821 Other seborrheic keratosis: Secondary | ICD-10-CM | POA: Diagnosis not present

## 2019-12-08 DIAGNOSIS — L570 Actinic keratosis: Secondary | ICD-10-CM | POA: Diagnosis not present

## 2019-12-08 MED ORDER — ENTRESTO 49-51 MG PO TABS: 1.0000 | ORAL_TABLET | Freq: Two times a day (BID) | ORAL | 11 refills | Status: DC

## 2019-12-08 NOTE — Telephone Encounter (Signed)
Informed patient that BMP drawn yesterday came back normal. Patient reports he has been feeling fine after increasing Entresto dose to 24/26 mg (1 tab) in AM and 49/51 mg (2 tabs) in PM. He has not noticed any negative reaction. Therefore, we will increase Entresto further to 49/51 mg BID. Patient verbalized understanding of plan and will begin new dose starting tomorrow. Instructed patient to continue monitoring BP at home and to call us if he experiences any dizziness or other side effects.  Patient was originally scheduled for follow-up on 9/30. However, patient requested to come in on 9/29 instead since he also has an imaging appointment that day. We have rescheduled patient for 9/29 at 9:30 AM and will follow-up then on how he is doing on new dose.    Esmeralda Links (PharmD Candidate 2022)

## 2019-12-10 LAB — MULTIPLE MYELOMA PANEL, SERUM
Albumin SerPl Elph-Mcnc: 3.8 g/dL (ref 2.9–4.4)
Albumin/Glob SerPl: 1.6 (ref 0.7–1.7)
Alpha 1: 0.3 g/dL (ref 0.0–0.4)
Alpha2 Glob SerPl Elph-Mcnc: 0.7 g/dL (ref 0.4–1.0)
B-Globulin SerPl Elph-Mcnc: 0.8 g/dL (ref 0.7–1.3)
Gamma Glob SerPl Elph-Mcnc: 0.7 g/dL (ref 0.4–1.8)
Globulin, Total: 2.5 g/dL (ref 2.2–3.9)
IgA/Immunoglobulin A, Serum: 187 mg/dL (ref 61–437)
IgG (Immunoglobin G), Serum: 745 mg/dL (ref 603–1613)
IgM (Immunoglobulin M), Srm: 28 mg/dL (ref 15–143)
Total Protein: 6.3 g/dL (ref 6.0–8.5)

## 2019-12-13 NOTE — Progress Notes (Signed)
Patient ID: [name]                 DOB: 04-06-1940                      MRN: [id number]     HPI: [name] is a 79 y.o. male referred by Dr. Rayann Heman to pharmacy clinic for HF medication management. PMH is significant for A flutter, CAD, CKD, CHF, TIA and HTN. Most recent LVEF 30% on 11/08/19. EF was 35% 05/21/2018.  Patient was last seen at pharmacy clinic on 12/01/2019. At that time, patient was on Entresto 24/26 mg BID and we increased to 1 tablet in morning and 2 tablets in evening. Labs drawn on 9/22 were stable and patient agreed to increase to Entresto to 49/51 mg BID.  Patient returns to clinic today for follow-up visit. Patient reports feeling mostly fine on new Entresto dose. However, on 9/23 patient did have one episode of extreme lightheadedness and very low blood pressure readings when patient was working on his pool (was constantly bending down). BP at that time went as low as 67/34. Patient had to step inside and rest, and his BP slowly recovered to 98/67 after 1 hour, then 115/69 after 3 hours. Patient reports having no other episode like this before. He reports this day was not particularly hot and he believes he was not dehydrated either. He also doubts that increase in Entresto dose is what caused this event, since he has otherwise felt no difference on any of the doses of Entresto he has been on. All home BP readings on days before and after this episode are otherwise stable in his typical range of 100s-120s/60s-70s. Of note, the episode occurred on 9/23, and patient did not increase his dose to 49/51 BID until morning of 9/24.  Patient denies SOB, swelling, weight gain, chest pain, and palpitations. Patient reports he has not played golf since starting care for HF, but remains physically active by doing plenty of yard work. Patient has been discussing with Dr. Rayann Heman to see if he is good candidate for defibrillator or pacemaker. At this time, they are waiting to see how  optimizing Delene Loll will affect his heart function before proceeding with decision.  Office BP today 122/82 and HR 47. Patient took his medications this morning.  Current CHF meds: Entresto 49/51 mg twice daily, metoprolol succinate 12.5 mg daily Previously tried: Lisinopril 10 mg daily BP goal: <130/80  Family History:       Family History  Problem Relation Age of Onset  . Breast cancer Mother   . Ulcers Father        stomach  . COPD Father   . Tuberculosis Father   . Colon polyps Brother   . Tuberculosis Brother   . Brain cancer Brother   . Colon cancer Neg Hx   . Stomach cancer Neg Hx   . Rectal cancer Neg Hx   . Esophageal cancer Neg Hx   . Liver cancer Neg Hx    Social History: Reports quitting smoking about 49 years ago. His smoking use included cigarettes. He has a 20.00 pack-year smoking history. He has never used smokeless tobacco. He reports that he does not drink alcohol and does not use drugs.  Diet: Wife doesn't cook with much salt, doesn't put much salt on things. Tries to drink more water than before.  Exercise: Light yard work, golf  Home BP readings: 104/70, 115/67, 117/70, 114/65, 139/77,  129/76, 103/59, 114/62, 111/73, 119/69, 117/75, 125/78, 118/66  Hypotensive episode (12/08/19): 75/51, 67/34, 93/59, 92/71, 88/68, 98/67  HR generally in 70s, sometimes 60s, sometimes 80s   Wt Readings from Last 3 Encounters:  12/14/19 172 lb (78 kg)  12/07/19 172 lb 12.8 oz (78.4 kg)  11/24/19 171 lb (77.6 kg)   BP Readings from Last 3 Encounters:  12/14/19 122/82  12/07/19 124/68  12/01/19 124/78   Pulse Readings from Last 3 Encounters:  12/14/19 (!) 47  12/07/19 96  12/01/19 (!) 55    Renal function: Estimated Creatinine Clearance: 52.4 mL/min (by C-G formula based on SCr of 1.18 mg/dL).  Past Medical History:  Diagnosis Date  . Atrial flutter ([place])    a. s/p CTI ablation x2 by Dr Rayann Heman 10/12, 9/13  . Basal cell carcinoma of cheek   .  BPH (benign prostatic hyperplasia)   . CAD (coronary artery disease)    nonobstructive on cath 08/2018  . CHF (congestive heart failure) ([place])    ef 35% on Echo 2020  . CKD (chronic kidney disease) stage 3, GFR 30-59 ml/min   . Colon polyps   . Diverticulosis   . Ectopic atrial tachycardia ([place])    a. Dx 07/2015: rate controlled   . Hemorrhoids   . Hyperlipidemia   . Hypertension   . Hypertrophic cardiomyopathy ([place])   . TIA (transient ischemic attack)     Current Outpatient Medications on File Prior to Visit  Medication Sig Dispense Refill  . atorvastatin (LIPITOR) 40 MG tablet TAKE 1 TABLET BY MOUTH EVERY DAY 90 tablet 1  . clindamycin (CLEOCIN) 300 MG capsule Take 2 capsules 1hour prior to dental procedure. 6 capsule 0  . Coenzyme Q10 (COQ10) 200 MG CAPS Take 200 mg by mouth every evening.    Marland Kitchen ELIQUIS 5 MG TABS tablet TAKE 1 TABLET BY MOUTH TWICE A DAY 180 tablet 1  . ezetimibe (ZETIA) 10 MG tablet Take 1 tablet (10 mg total) by mouth daily. 90 tablet 3  . Melatonin 5 MG TABS Take 5 mg by mouth at bedtime.    . metoprolol succinate (TOPROL-XL) 25 MG 24 hr tablet TAKE 1 TABLET BY MOUTH EVERY DAY 90 tablet 3   Current Facility-Administered Medications on File Prior to Visit  Medication Dose Route Frequency Provider Last Rate Last Admin  . 0.9 %  sodium chloride infusion  500 mL Intravenous Continuous Pyrtle, Lajuan Lines, MD        Allergies  Allergen Reactions  . Nsaids Other (See Comments)    GI  Upset   . Crestor [Rosuvastatin Calcium] Other (See Comments)    Joint pain  . Penicillins Other (See Comments)    Unknown reaction Did it involve swelling of the face/tongue/throat, SOB, or low BP? Unknown Did it involve sudden or severe rash/hives, skin peeling, or any reaction on the inside of your mouth or nose? Unknown Did you need to seek medical attention at a hospital or doctor's office? Unknown When did it last happen? More than 50 years ago If all above answers are "NO",  may proceed with cephalosporin use.      Assessment/Plan:  1. CHF - Patient's office & home BP is within goal of <130/80. Increasing Entresto to 49/51 mg BID has not seemed to lower his BP too drastically. However, given patient's recent episode of hypotension and lightheadedness, we will be conservative and make no medication changes today. Plan to continue Entresto 49/51 mg BID and metoprolol succinate 12.5 mg  daily, and will continue to monitor BP/HR on these doses for next few weeks. If patient remains stable without any other hypotensive episodes, can consider starting spironolactone 12.5 mg next, or initiating an SGLT2 inhibitor. Due to patient's first-degree AV block and right bundle branch block, we will not increase metoprolol dose at this time. Patient has appointment with Dr. Owens Shark on Oct 7. Will get a BMP at that visit. Plan to follow up with patient in clinic in 3 weeks.    Esmeralda Links (PharmD Candidate 2022)  Ramond Dial, Pharm.D, BCPS, CPP [place]  [address] [place], [place] [address]  Phone: [phone]; Fax: [phone]

## 2019-12-14 ENCOUNTER — Telehealth: Payer: Self-pay | Admitting: Pharmacist

## 2019-12-14 ENCOUNTER — Ambulatory Visit (INDEPENDENT_AMBULATORY_CARE_PROVIDER_SITE_OTHER): Payer: PPO | Admitting: Pharmacist

## 2019-12-14 ENCOUNTER — Other Ambulatory Visit: Payer: Self-pay

## 2019-12-14 ENCOUNTER — Ambulatory Visit (HOSPITAL_COMMUNITY): Payer: PPO

## 2019-12-14 ENCOUNTER — Ambulatory Visit (HOSPITAL_COMMUNITY)
Admission: RE | Admit: 2019-12-14 | Discharge: 2019-12-14 | Disposition: A | Discharge status: Home / Self Care | Payer: PPO | Source: Ambulatory Visit | Attending: Cardiovascular Disease | Admitting: Cardiovascular Disease

## 2019-12-14 VITALS — BP 122/82 | HR 47

## 2019-12-14 DIAGNOSIS — I428 Other cardiomyopathies: Secondary | ICD-10-CM | POA: Diagnosis not present

## 2019-12-14 DIAGNOSIS — I5022 Chronic systolic (congestive) heart failure: Secondary | ICD-10-CM

## 2019-12-14 MED ORDER — ENTRESTO 49-51 MG PO TABS: 1.0000 | ORAL_TABLET | Freq: Two times a day (BID) | ORAL | 3 refills | Status: DC

## 2019-12-14 MED ORDER — TECHNETIUM TC 99M TETROFOSMIN IV KIT
20.7000 | PACK | Freq: Once | INTRAVENOUS | Status: AC | PRN
  Administered 2019-12-14: 20.7 via INTRAVENOUS
  Filled 2019-12-14: qty 21

## 2019-12-14 NOTE — Telephone Encounter (Signed)
After pt left from visit today I realized that his medlist had metoprolol 25mg  on it (the way his PCP wrote the rx) but from our note pt was taking 12.5mg . I called pt to confirm that he is taking 1/2 tab 12.5mg  daily. Pt confirmed taking 12.5mg  daily. I have updated medlist. Pt also made aware to go to lab next week when he sees Dr. Gasper Sells for a BMP.

## 2019-12-14 NOTE — Patient Instructions (Addendum)
Great to see you today!  Today, we will make NO medication changes. Please continue taking Entresto 49/51 mg twice daily and metoprolol succinate 12.5 mg daily. Continue to monitor home blood pressure and heart rate readings, and call us if you have any other episodes of dizziness.  We have sent in a 90-day supply of Entresto 49/51 mg tablets to your pharmacy.  We will see you back in clinic in 3 weeks (01/05/2020).   Please call us if you have any questions or concerns: [phone]

## 2019-12-15 ENCOUNTER — Ambulatory Visit: Payer: PPO

## 2019-12-22 ENCOUNTER — Ambulatory Visit: Payer: PPO | Admitting: Internal Medicine

## 2019-12-22 ENCOUNTER — Encounter: Payer: Self-pay | Admitting: Internal Medicine

## 2019-12-22 ENCOUNTER — Telehealth: Payer: Self-pay | Admitting: Internal Medicine

## 2019-12-22 ENCOUNTER — Other Ambulatory Visit: Payer: PPO

## 2019-12-22 ENCOUNTER — Other Ambulatory Visit: Payer: Self-pay

## 2019-12-22 VITALS — BP 124/60 | HR 59 | Ht 70.0 in | Wt 175.0 lb

## 2019-12-22 DIAGNOSIS — I422 Other hypertrophic cardiomyopathy: Secondary | ICD-10-CM | POA: Diagnosis not present

## 2019-12-22 DIAGNOSIS — I5022 Chronic systolic (congestive) heart failure: Secondary | ICD-10-CM | POA: Diagnosis not present

## 2019-12-22 NOTE — Patient Instructions (Addendum)
Medication Instructions:   Your physician recommends that you continue on your current medications as directed. Please refer to the Current Medication list given to you today.   *If you need a refill on your cardiac medications before your next appointment, please call your pharmacy*   Lab Work:  BNP  TODAY AND  BMET DONE (12-07-19 LESS THAN 30 DAYS )  If you have labs (blood work) drawn today and your tests are completely normal, you will receive your results only by: Marland Kitchen MyChart Message (if you have MyChart) OR . A paper copy in the mail If you have any lab test that is abnormal or we need to change your treatment, we will call you to review the results.   Testing/Procedures: Your physician has requested that you have a cardiac MRI. Cardiac MRI uses a computer to create images of your heart as its beating, producing both still and moving pictures of your heart and major blood vessels. For further information please visit [email or web address] Please follow the instruction sheet given to you today for more information.    Follow-Up: At [facility], you and your health needs are our priority.  As part of our continuing mission to provide you with exceptional heart care, we have created designated Provider Care Teams.  These Care Teams include your primary Cardiologist (physician) and Advanced Practice Providers (APPs -  Physician Assistants and Nurse Practitioners) who all work together to provide you with the care you need, when you need it.  We recommend signing up for the patient portal called "MyChart".  Sign up information is provided on this After Visit Summary.  MyChart is used to connect with patients for Virtual Visits (Telemedicine).  Patients are able to view lab/test results, encounter notes, upcoming appointments, etc.  Non-urgent messages can be sent to your provider as well.   To learn more about what you can do with MyChart, go to [email or web address].    Your next  appointment:   6-8  week(s)  The format for your next appointment:   In Person  Provider:   Rudean Haskell, MD   Other Instructions

## 2019-12-22 NOTE — Progress Notes (Signed)
Cardiology Office Note:    Date:  12/22/2019   ID:  [name], DOB 09-16-1940, MRN [id number]  PCP:  Susy Frizzle, MD  [facility] Cardiologist:  Thompson Grayer, MD -> [facility] Electrophysiologist:  Thompson Grayer, MD   Referring MD: Susy Frizzle, MD   CC:  New Heart Failure  History of Present Illness:    [name] is a 79 y.o. male with a hx of Atrial flutter s/p multiple CT ablations last in 2013 (6+), AT, non-obstructive CAD with LCP in 08/2018, Hx of HTN with query of hypertrophic cardiomyopathy (septal to base ratio 1.5), HLD on atorvastatin, prior TIA. CKD stage III 58 Who presents to establish general care.  Patient notes that when he was  In college, he failed a civil service exam and had a LHC.  At that time was diagnosed with Subaortic membranes.  Patient diagnosed for a long time with this disease.    Patient feels well, able to play golf a couple times a week.  Able to farm and mow.  Notes that three weeks ago and to stop golf with shortness of breath.  Notes one episodes of light headedness and weakness after working on his pool.  Generalized weakness.  No syncope.  Blood pressure at that time was 66/34.   Had memory issues. None of these symptoms after meals, after hot days.     No prior hx of SCD: one brother left, (one died of brain cancer).  No drownings no car accidents.  No history of amyloidosis, or a carpal tunnel surgery.    Past Medical History:  Diagnosis Date   Atrial flutter ([place])    a. s/p CTI ablation x2 by Dr Rayann Heman 10/12, 9/13   Basal cell carcinoma of cheek    BPH (benign prostatic hyperplasia)    CAD (coronary artery disease)    nonobstructive on cath 08/2018   CHF (congestive heart failure) ([place])    ef 35% on Echo 2020   CKD (chronic kidney disease) stage 3, GFR 30-59 ml/min (HCC)    Colon polyps    Diverticulosis    Ectopic atrial tachycardia ([place])    a. Dx 07/2015: rate controlled    Hemorrhoids     Hyperlipidemia    Hypertension    Hypertrophic cardiomyopathy (HCC)    TIA (transient ischemic attack)     Past Surgical History:  Procedure Laterality Date   ATRIAL ABLATION SURGERY  10/12   CTI ablation for atrial flutter by Dr Rayann Heman   ATRIAL FLUTTER ABLATION N/A 12/11/2011   CTI ablation for atrial flutter by Dr Rayann Heman   BASAL CELL CARCINOMA EXCISION  ~ 1973   left cheek   CARDIAC CATHETERIZATION  1960's   HERNIA REPAIR  ~ 8416   umbilical   JOINT REPLACEMENT     RIGHT/LEFT HEART CATH AND CORONARY ANGIOGRAPHY N/A 08/26/2018   Procedure: RIGHT/LEFT HEART CATH AND CORONARY ANGIOGRAPHY;  Surgeon: Sherren Mocha, MD;  Location: [place] [facility];  Service: Cardiovascular;  Laterality: N/A;   TONSILLECTOMY AND ADENOIDECTOMY  1947   TOTAL HIP ARTHROPLASTY     left    Current Medications: Current Meds  Medication Sig   atorvastatin (LIPITOR) 40 MG tablet TAKE 1 TABLET BY MOUTH EVERY DAY   clindamycin (CLEOCIN) 300 MG capsule Take 2 capsules 1hour prior to dental procedure.   Coenzyme Q10 (COQ10) 200 MG CAPS Take 200 mg by mouth every evening.   ELIQUIS 5 MG TABS tablet TAKE  1 TABLET BY MOUTH TWICE A DAY   ezetimibe (ZETIA) 10 MG tablet Take 1 tablet (10 mg total) by mouth daily.   Melatonin 5 MG TABS Take 5 mg by mouth at bedtime.   metoprolol succinate (TOPROL-XL) 25 MG 24 hr tablet Take 0.5 tablets (12.5 mg total) by mouth daily.   sacubitril-valsartan (ENTRESTO) 49-51 MG Take 1 tablet by mouth 2 (two) times daily.   Current Facility-Administered Medications for the 12/22/19 encounter (Office Visit) with Werner Lean, MD  Medication   0.9 %  sodium chloride infusion     Allergies:   Nsaids, Crestor [rosuvastatin calcium], and Penicillins   Social History   Socioeconomic History   Marital status: Married    Spouse name: Not on file   Number of children: 2   Years of education: Not on file   Highest education level: Not on file    Occupational History   Occupation: retired  Tobacco Use   Smoking status: Former Smoker    Packs/day: 2.00    Years: 10.00    Pack years: 20.00    Types: Cigarettes    Quit date: 03/17/1970    Years since quitting: 49.8   Smokeless tobacco: Never Used  Vaping Use   Vaping Use: Never used  Substance and Sexual Activity   Alcohol use: No    Comment: quit 1995   Drug use: No   Sexual activity: Yes  Other Topics Concern   Not on file  Social History Narrative   Lives in [place] with spouse.  2 Grown children.  Retired from Data processing manager   Social Determinants of Radio broadcast assistant Strain: Low Risk    Difficulty of Paying Living Expenses: Not hard at all  Food Insecurity:    Worried About Charity fundraiser in the Last Year: Not on file   [employer] of Food in the Last Year: Not on file  Transportation Needs:    Film/video editor (Medical): Not on file   Lack of Transportation (Non-Medical): Not on file  Physical Activity:    Days of Exercise per Week: Not on file   Minutes of Exercise per Session: Not on file  Stress:    Feeling of Stress : Not on file  Social Connections:    Frequency of Communication with Friends and Family: Not on file   Frequency of Social Gatherings with Friends and Family: Not on file   Attends Religious Services: Not on file   Active Member of Clubs or Organizations: Not on file   Attends Archivist Meetings: Not on file   Marital Status: Not on file     Family History: The patient's family history includes Brain cancer in his brother; Breast cancer in his mother; COPD in his father; Colon polyps in his brother; Tuberculosis in his brother and father; Ulcers in his father. There is no history of Colon cancer, Stomach cancer, Rectal cancer, Esophageal cancer, or Liver cancer.  ROS:   Please see the history of present illness.    All other systems reviewed and are  negative.  EKGs/Labs/Other Studies Reviewed:    The following studies were reviewed today:  Recent Labs: 11/15/2019: ALT 29; Hemoglobin 14.8; Platelets 209 12/07/2019: BUN 17; Creatinine, Ser 1.18; Potassium 4.2; Sodium 141  Recent Lipid Panel    Component Value Date/Time   CHOL 138 11/15/2019 0922   TRIG 61 11/15/2019 0922   HDL 63 11/15/2019 0922   CHOLHDL 2.2 11/15/2019  0922   CHOLHDL 2.8 01/18/2019 0920   VLDL 19 11/03/2016 0841   LDLCALC 62 11/15/2019 0922   LDLCALC 101 (H) 01/18/2019 0920   Exercise stress test 05/21/18:  No exercise associated VT.  Large anterior perfusion defect.  Normal exercise response  11/08/19 Echo Reduced EF with asymmetric thickening and without SAM. 1. Left ventricular ejection fraction, by estimation, is 30%. The left  ventricle has severely decreased function. The left ventricle demonstrates  regional wall motion abnormalities with hypokinesis of the inferior wall,  the inferoseptal wall, the anteroseptal wall, and the apex. There is mild left ventricular hypertrophy. Left ventricular diastolic parameters are consistent with Grade I diastolic dysfunction (impaired relaxation).  2. Right ventricular systolic function is normal. The right ventricular  size is normal. Tricuspid regurgitation signal is inadequate for assessing PA pressure.  3. Left atrial size was mildly dilated.  4. Right atrial size was mildly dilated.  5. The mitral valve is normal in structure. Trivial mitral valve  regurgitation. No evidence of mitral stenosis.  6. The aortic valve is tricuspid. Aortic valve regurgitation is not  visualized. No aortic stenosis is present.  7. Aortic dilatation noted. There is mild dilatation of the ascending  aorta measuring 37 mm.  8. The inferior vena cava is normal in size with greater than 50%  respiratory variability, suggesting right atrial pressure of 3 mmHg.   08/26/18 LCP  1.  Nonobstructive coronary artery disease with patency  of the left main stem, patency of the LAD with minor nonobstructive disease, patency of the circumflex/intermediate, severe stenosis at the ostium of the small distal diagonal branch, and mild nonobstructive disease of a large, dominant RCA 2.  Normal intracardiac hemodynamics in both the right and left heart with normal/low intracardiac filling pressures suggestive of well compensated chronic systolic heart failure  0/08/67 Conclusion Visual quantitative assessment is Grade 2 (suggestive of amyloid), however -- H/CL ratio is 1.3 3) Equivocal for TTR amyloidosis (Equivocal: A semi-quantitative visual score of 1 or H/CL ratio 1-1.5)** Consider cMRI to further evaluate for TTR amyloid    Physical Exam:    VS:  BP 124/60    Pulse (!) 59    Ht 5\' 10"  (1.778 m)    Wt 175 lb (79.4 kg)    SpO2 98%    BMI 25.11 kg/m     Wt Readings from Last 3 Encounters:  12/22/19 175 lb (79.4 kg)  12/14/19 172 lb (78 kg)  12/07/19 172 lb 12.8 oz (78.4 kg)    GEN: Well nourished, well developed in no acute distress HEENT: Normal NECK: No JVD; No carotid bruits LYMPHATICS: No lymphadenopathy CARDIAC: RRR, no murmurs, rubs, gallops no change with handgrip RESPIRATORY:  Clear to auscultation without rales, wheezing or rhonchi  ABDOMEN: Soft, non-tender, non-distended MUSCULOSKELETAL:  No edema; No deformity  SKIN: Warm and dry NEUROLOGIC:  Alert and oriented x 3 PSYCHIATRIC:  Normal affect   ASSESSMENT:    1. Chronic systolic CHF (congestive heart failure) ([place])   2. Hypertrophic nonobstructive cardiomyopathy (HCC)    PLAN:    In order of problems listed above:  1. .Systolic Decompensated Heart Failure with  HTN, Query of HCM, CKD - NYHA class II, Stage B, hypervolemic, etiology from HCM - If patient truly has HCM etiology, with EF < 50% would meet indication for primary prevention ICD; given his 1st Hb would likely have a reasonable pacing burden; patient presently has a septal thickness of 14.5  mm. - will offer Cardiac  MRI; for HCM and rule out amyloidosis, BNP - Euvolemic- will not add lasix at this time - Metoprolol succinate 12.5 mg daily with heart rates in 40s to 50s.  - ARNI at 49-51; will continue this dose.  This may be his maximally tolerated dose.  Will discuss slow increase in medication  - with GFR and CKD III, will attempt ARNI  - Will defer outpatient SGLT2i at this time  Atrial Flutter and AT - CHADSVASC 6 on DOAC; with possible HCM - will defer to EP at this time presently sinus  6- 8  weeks follow up for uptitration of medications unless new symptoms or abnormal test results warranting change in plan  Medication Adjustments/Labs and Tests Ordered: Current medicines are reviewed at length with the patient today.  Concerns regarding medicines are outlined above.  No orders of the defined types were placed in this encounter.  No orders of the defined types were placed in this encounter.   There are no Patient Instructions on file for this visit.   Signed, Werner Lean, MD  12/22/2019 9:21 AM    [place]

## 2019-12-22 NOTE — Telephone Encounter (Signed)
Left message for patient to call and discuss scheduling the Cardiac MRI that has been ordered by [facility]

## 2019-12-23 LAB — BASIC METABOLIC PANEL
BUN/Creatinine Ratio: 19 (ref 10–24)
BUN: 24 mg/dL (ref 8–27)
CO2: 21 mmol/L (ref 20–29)
Calcium: 9.1 mg/dL (ref 8.6–10.2)
Chloride: 106 mmol/L (ref 96–106)
Creatinine, Ser: 1.24 mg/dL (ref 0.76–1.27)
GFR calc Af Amer: 64 mL/min/{1.73_m2} (ref >59)
GFR calc non Af Amer: 55 mL/min/{1.73_m2} — ABNORMAL LOW (ref >59)
Glucose: 94 mg/dL (ref 65–99)
Potassium: 4.8 mmol/L (ref 3.5–5.2)
Sodium: 140 mmol/L (ref 134–144)

## 2019-12-23 LAB — PRO B NATRIURETIC PEPTIDE: NT-Pro BNP: 1101 pg/mL — ABNORMAL HIGH (ref 0–486)

## 2019-12-26 ENCOUNTER — Encounter: Payer: Self-pay | Admitting: Internal Medicine

## 2019-12-26 NOTE — Telephone Encounter (Signed)
Spoke with patient's wife concerning appointment for Cardiac MRI scheduled Wednesday 01/18/20 at 8:00 am at [facility]----arrival time is 7:30 am 1st floor admissions office for check in.  Will mail information to patient and wife voiced her understanding.

## 2020-01-01 ENCOUNTER — Other Ambulatory Visit: Payer: Self-pay | Admitting: Family Medicine

## 2020-01-03 ENCOUNTER — Telehealth: Payer: Self-pay | Admitting: Internal Medicine

## 2020-01-03 NOTE — Telephone Encounter (Signed)
New message:     Manuela Schwartz from Radiology at [facility] calling stating that she made mistake and schedule the patient at the wrong location and need to be schedule at [facility]. The patient need to be called to call Imaging a make a apt. Phone number at Radiology is [phone]

## 2020-01-03 NOTE — Telephone Encounter (Signed)
Returned call to [name] from scheduling to inquire about her cancellation of the procedure at [facility]. [name] apologized for the error and states she is reinstating the appointment - she did not intend to cancel this appointment, only the one for [facility]. She has not contacted the patient. I advised that patient was last advised to present to [facility] at 7:30 am on 11/3 for his MRI so I do not see a need to contact him since he has not been given any different instructions. [name] thanked me for my help.

## 2020-01-04 NOTE — Progress Notes (Signed)
Patient ID: [name]                 DOB: 10-25-1940                      MRN: [id number]     HPI: [name] is a 79 y.o. male referred by Dr. Rayann [name] to pharmacy clinic for HF medication management. PMH is significant for Afib on anticoag, CAD, CKD III, CHF, TIA and HTN. Most recent LVEF30%on 11/08/19.EF was 35% 05/21/2018.  The patient was last seen in clinic 12/14/19 as a follow-up after increasing his Entresto. No medication changes were made at this time. At this time, the patient reported tolerating the dose increase with reported home BP readings 100-120s/60s-70s & HR 40-50. The patient did endorse 1 x episode of dizziness at this time with a documented BP 67/34. This was prior to the [facility] dose increase. Per our last conversation with the patient, the patient was discussing with Dr. Rayann [name] to see if he is good candidate for defibrillator or pacemaker. They had decided to wait to see how optimizing Delene Loll will affect his heart function before proceeding with decision. Visit with Dr. Gasper [name] 12/22/19 indicated concern for possible amyloidosis. Plan to do cardiac MRI 01/18/20 to further evaluation.  The patient is doing well today. He denies anymore dizzy episodes, orthostasis, orthopnea, or SOB with ADLS. He has been weighing himself everyday, and has gained 2 lb over a few months that he attributes to decreased exercise. Denies LEE or any signs of edema. The patient does endorse one episode of extreme fatigue that occurred when he was doing yard work outside (recently been raking, mulching, planting, preparing to lay grass seed). Of note, he attributes this to dehydration. The patient confirms that he is taking Entresto 49/41mg  BID (taking 2x tablets of lower strength).   The patient expresses nervousness about going out and doing things such as playing golf. He is especially worried about it being too strenuous. The patient has not had any severe episodes of fatigue since his  initial presentation, but is worried that if he goes back to his previous hobbies, these could be harmful to him. Requested that we confirm with his cardiologist that he would be cleared to do activities such as golf.  Current CHF meds:Entresto 49/51 mg twice daily, metoprolol succinate12.5 mg daily Previously tried: Lisinopril 10 mg daily BP goal:<130/80  Family History:      Family History  Problem Relation Age of Onset  . Breast cancer Mother   . Ulcers Father    stomach  . COPD Father   . Tuberculosis Father   . Colon polyps Brother   . Tuberculosis Brother   . Brain cancer Brother      Social History: Reports quitting smoking about 49 years ago. His smoking use included cigarettes. He has a 20.00 pack-year smoking history. He has never used smokeless tobacco. He reports that he does not drink alcohol and does not use drugs.  Diet:Wife doesn't cook with much salt, doesn't put much salt on things. Tries to drink more water than before.  - the patient has been making really significant efforts to stay hydrated  Exercise:Light yard work, golf  Home BP readings: 10/16 - 10/21 SBP/DBP/HR 105/63/61 96/59/56 94/50/81 76/46/76 114/56/70 108/54/58 120/65/62 122/68/62 123/67/71 87/47/64 131/77/66 122/76/60  Wt Readings from Last 3 Encounters:  12/22/19 175 lb (79.4 kg)  12/14/19 172 lb (78 kg)  12/07/19 172 lb 12.8 oz (78.4  kg)   BP Readings from Last 3 Encounters:  12/22/19 124/60  12/14/19 122/82  12/07/19 124/68   Pulse Readings from Last 3 Encounters:  12/22/19 (!) 59  12/14/19 (!) 47  12/07/19 96    Renal function: Estimated Creatinine Clearance: 49.9 mL/min (by C-G formula based on SCr of 1.24 mg/dL).   11/15/2019: ALT 29; Hemoglobin 14.8; Platelets 209 12/07/2019: BUN 17; Creatinine, Ser 1.18; Potassium 4.2; Sodium 141 12/22/2019: K 4.8, scr 1.24  Past Medical History:  Diagnosis Date  . Atrial flutter ([place])    a. s/p CTI  ablation x2 by Dr [name] 10/12, 9/13  . Basal cell carcinoma of cheek   . BPH (benign prostatic hyperplasia)   . CAD (coronary artery disease)    nonobstructive on cath 08/2018  . CHF (congestive heart failure) ([place])    ef 35% on Echo 2020  . CKD (chronic kidney disease) stage 3, GFR 30-59 ml/min (HCC)   . Colon polyps   . Diverticulosis   . Ectopic atrial tachycardia ([place])    a. Dx 07/2015: rate controlled   . Hemorrhoids   . Hyperlipidemia   . Hypertension   . Hypertrophic cardiomyopathy ([place])   . TIA (transient ischemic attack)     Current Outpatient Medications on File Prior to Visit  Medication Sig Dispense Refill  . atorvastatin (LIPITOR) 40 MG tablet TAKE 1 TABLET BY MOUTH EVERY DAY 90 tablet 1  . clindamycin (CLEOCIN) 300 MG capsule Take 2 capsules 1hour prior to dental procedure. 6 capsule 0  . Coenzyme Q10 (COQ10) 200 MG CAPS Take 200 mg by mouth every evening.    Marland Kitchen ELIQUIS 5 MG TABS tablet TAKE 1 TABLET BY MOUTH TWICE A DAY 180 tablet 1  . ezetimibe (ZETIA) 10 MG tablet Take 1 tablet (10 mg total) by mouth daily. 90 tablet 3  . lisinopril (ZESTRIL) 10 MG tablet TAKE 1 TABLET BY MOUTH EVERY DAY IN THE EVENING 90 tablet 1  . Melatonin 5 MG TABS Take 5 mg by mouth at bedtime.    . metoprolol succinate (TOPROL-XL) 25 MG 24 hr tablet Take 0.5 tablets (12.5 mg total) by mouth daily. 90 tablet 3  . sacubitril-valsartan (ENTRESTO) 49-51 MG Take 1 tablet by mouth 2 (two) times daily. 180 tablet 3   Current Facility-Administered Medications on File Prior to Visit  Medication Dose Route Frequency Provider Last Rate Last Admin  . 0.9 %  sodium chloride infusion  500 mL Intravenous Continuous [name], Anthony Lines, MD        Allergies  Allergen Reactions  . Nsaids Other (See Comments)    GI  Upset   . Crestor [Rosuvastatin Calcium] Other (See Comments)    Joint pain  . Penicillins Other (See Comments)    Unknown reaction Did it involve swelling of the face/tongue/throat, SOB, or low  BP? Unknown Did it involve sudden or severe rash/hives, skin peeling, or any reaction on the inside of your mouth or nose? Unknown Did you need to seek medical attention at a hospital or doctor's office? Unknown When did it last happen? More than 50 years ago If all above answers are "NO", may proceed with cephalosporin use.     There were no vitals taken for this visit.   Assessment/Plan:  1. Hypertension -  The patient's BP is consistently to goal (<130/80) with a few readings that are concerning for hypotension (<90 SBP), though largely not associated with symptoms. The patient reports that he has not had any more  dizziness episodes since his last visit and appears to be tolerating his current dose of Entresto and metoprolol succinate. The patient is pending a cardiac MRI to confirm whether he has amyloidosis. As standard GDMT heart failure medications do not have data to support significant proven benefit in HF secondary to amyloidosis, future medication choices will be directed by this diagnosis. Amyloidosis was briefly discussed with patient as well as the associated treatment differences between CHF and CHF secondary to amyloidosis.  Given the patient's significant interest in further optimization of HF medications, but frequent episodes of asymptomatic hypotension, discussed the initiation of spironolactone at 12.5mg  every night with the patient. The patient's low blood pressure readings w/ AV block make titration of metoprolol succinate and Entresto unproferred at this time. As spironolactone at doses of 12.5mg  daily have little associated hypotensive effects will consider initiation after BMP. Will obtain repeat BMP today given patient's previous high-normal potassium a few weeks prior, and call patient further instructions once results have been obtained.  Will contact Dr. Gasper [name] regarding patient's question as to whether or not it will be safe for him to play golf. Will  scheduled next visit with patient in 2-4 weeks depending on if spironolactone is initiated.   Thank you,   Clinton Quant, Pharmacy Student Class of [address] [place], [place].D, BCPS, CPP [place]  [address] [place], [place] [address]  Phone: [phone]; Fax: [phone]    HTN HFrEF and Hypertrophic substrate - CMR pending - agree with aldactone start if BMP OK. - OK to golf if asymptomatic; should stop if these sx if symptoms with golf; would advise against golfing in extreme heat.  Rest as above. Werner Lean, MD

## 2020-01-05 ENCOUNTER — Ambulatory Visit (INDEPENDENT_AMBULATORY_CARE_PROVIDER_SITE_OTHER): Payer: PPO | Admitting: Pharmacist

## 2020-01-05 ENCOUNTER — Other Ambulatory Visit: Payer: Self-pay

## 2020-01-05 VITALS — BP 126/80 | HR 63

## 2020-01-05 DIAGNOSIS — I422 Other hypertrophic cardiomyopathy: Secondary | ICD-10-CM | POA: Diagnosis not present

## 2020-01-05 LAB — BASIC METABOLIC PANEL
BUN/Creatinine Ratio: 20 (ref 10–24)
BUN: 23 mg/dL (ref 8–27)
CO2: 23 mmol/L (ref 20–29)
Calcium: 9.1 mg/dL (ref 8.6–10.2)
Chloride: 104 mmol/L (ref 96–106)
Creatinine, Ser: 1.14 mg/dL (ref 0.76–1.27)
GFR calc Af Amer: 70 mL/min/{1.73_m2} (ref >59)
GFR calc non Af Amer: 61 mL/min/{1.73_m2} (ref >59)
Glucose: 98 mg/dL (ref 65–99)
Potassium: 4.8 mmol/L (ref 3.5–5.2)
Sodium: 139 mmol/L (ref 134–144)

## 2020-01-05 NOTE — Patient Instructions (Addendum)
Thank you for seeing [name] today!  Your blood pressure was great in clinic today, but you had a few BP readings that were a little lower than we would like. We would like to start you on a low dose of spironolactone at night that has shown to have significant mortality benefit in those with heart failure. Before we do this, we need to check your potassium and renal function. This medication is not likely to decrease your blood pressure more, but if you notice you are getting dizzy or seeing lower readings, please let [name] know.   After we get lab results, we will call you to discuss the initiation of the following: - spironolactone 12.5mg  taken every night   We will schedule a follow-up visit with you once we obtain your lab results. Keep taking your blood pressure as you have been and documenting when you are getting especially fatigued.  If you need [name] for any reason, please call [name] at [phone].  Thank you! [name]

## 2020-01-06 ENCOUNTER — Telehealth: Payer: Self-pay

## 2020-01-06 DIAGNOSIS — I10 Essential (primary) hypertension: Secondary | ICD-10-CM

## 2020-01-06 DIAGNOSIS — I5022 Chronic systolic (congestive) heart failure: Secondary | ICD-10-CM

## 2020-01-06 MED ORDER — SPIRONOLACTONE 25 MG PO TABS: 12.5000 mg | ORAL_TABLET | Freq: Every day | ORAL | 3 refills | Status: DC

## 2020-01-06 NOTE — Telephone Encounter (Signed)
Called and spoke with pt wife (per DPR)  Advised that we will start spironolactone 12.5mg  (1/2 tab) at night. He will come get repeat lab on thurs 10/28. Also advised that per Dr. Gasper Sells ok to play golf. Stop if symptoms occur and avoid if extreme heat. Wife will pass message along.

## 2020-01-06 NOTE — Telephone Encounter (Signed)
Left voicemail on patient's cell phone  & requested a call back to discuss his most recent lab results and the initiation of spironolactone 12.5mg  daily.

## 2020-01-11 ENCOUNTER — Other Ambulatory Visit (HOSPITAL_COMMUNITY): Payer: PPO

## 2020-01-12 ENCOUNTER — Other Ambulatory Visit: Payer: Self-pay

## 2020-01-12 ENCOUNTER — Other Ambulatory Visit: Payer: PPO | Admitting: *Deleted

## 2020-01-12 DIAGNOSIS — I5022 Chronic systolic (congestive) heart failure: Secondary | ICD-10-CM

## 2020-01-12 LAB — BASIC METABOLIC PANEL
BUN/Creatinine Ratio: 20 (ref 10–24)
BUN: 26 mg/dL (ref 8–27)
CO2: 18 mmol/L — ABNORMAL LOW (ref 20–29)
Calcium: 9.3 mg/dL (ref 8.6–10.2)
Chloride: 106 mmol/L (ref 96–106)
Creatinine, Ser: 1.29 mg/dL — ABNORMAL HIGH (ref 0.76–1.27)
GFR calc Af Amer: 61 mL/min/{1.73_m2} (ref >59)
GFR calc non Af Amer: 52 mL/min/{1.73_m2} — ABNORMAL LOW (ref >59)
Glucose: 91 mg/dL (ref 65–99)
Potassium: 4.9 mmol/L (ref 3.5–5.2)
Sodium: 138 mmol/L (ref 134–144)

## 2020-01-13 NOTE — Addendum Note (Signed)
Addended by: Nazim Kadlec E on: 01/13/2020 01:22 PM   Modules accepted: Orders

## 2020-01-17 ENCOUNTER — Telehealth (HOSPITAL_COMMUNITY): Payer: Self-pay | Admitting: Emergency Medicine

## 2020-01-17 NOTE — Telephone Encounter (Signed)
Reaching out to patient to offer assistance regarding upcoming cardiac imaging study; pt verbalizes understanding of appt date/time, parking situation and where to check in, pre-test NPO status and medications ordered, and verified current allergies; name and call back number provided for further questions should they arise Anthony Bond RN Navigator Cardiac Imaging [facility] [phone] office [phone] cell  Pt denies claustro, denies implants

## 2020-01-18 ENCOUNTER — Ambulatory Visit (HOSPITAL_COMMUNITY)
Admission: RE | Admit: 2020-01-18 | Discharge: 2020-01-18 | Disposition: A | Discharge status: Home / Self Care | Payer: PPO | Source: Ambulatory Visit | Attending: Internal Medicine | Admitting: Internal Medicine

## 2020-01-18 ENCOUNTER — Other Ambulatory Visit (HOSPITAL_COMMUNITY): Payer: PPO

## 2020-01-18 ENCOUNTER — Other Ambulatory Visit: Payer: Self-pay

## 2020-01-18 DIAGNOSIS — I422 Other hypertrophic cardiomyopathy: Secondary | ICD-10-CM

## 2020-01-18 MED ORDER — GADOBUTROL 1 MMOL/ML IV SOLN
8.0000 mL | Freq: Once | INTRAVENOUS | Status: AC | PRN
  Administered 2020-01-18: 8 mL via INTRAVENOUS

## 2020-01-20 ENCOUNTER — Other Ambulatory Visit: Payer: Self-pay | Admitting: Internal Medicine

## 2020-01-20 NOTE — Telephone Encounter (Signed)
Prescription refill request for Eliquis received. Indication:afib Last office visit:12/22/19 Scr: 1.29 on 01/12/20 Age: 79 Weight: 79.4kg

## 2020-01-24 ENCOUNTER — Other Ambulatory Visit: Payer: Self-pay

## 2020-01-24 ENCOUNTER — Ambulatory Visit: Payer: PPO | Admitting: Internal Medicine

## 2020-01-24 ENCOUNTER — Encounter: Payer: Self-pay | Admitting: Internal Medicine

## 2020-01-24 VITALS — BP 116/60 | HR 61 | Ht 70.0 in | Wt 175.0 lb

## 2020-01-24 DIAGNOSIS — E782 Mixed hyperlipidemia: Secondary | ICD-10-CM

## 2020-01-24 DIAGNOSIS — I2510 Atherosclerotic heart disease of native coronary artery without angina pectoris: Secondary | ICD-10-CM | POA: Diagnosis not present

## 2020-01-24 DIAGNOSIS — I5020 Unspecified systolic (congestive) heart failure: Secondary | ICD-10-CM | POA: Insufficient documentation

## 2020-01-24 DIAGNOSIS — I10 Essential (primary) hypertension: Secondary | ICD-10-CM

## 2020-01-24 MED ORDER — SPIRONOLACTONE 25 MG PO TABS
25.0000 mg | ORAL_TABLET | Freq: Every day | ORAL | 3 refills | Status: DC
Start: 2020-01-24 — End: 2020-02-14

## 2020-01-24 NOTE — Patient Instructions (Signed)
Medication Instructions:  Your physician has recommended you make the following change in your medication:   INCREASE: spironolactone (aldactone) to 25 mg once a day  *If you need a refill on your cardiac medications before your next appointment, please call your pharmacy*   Lab Work: Your physician recommends that you return for lab work (BMET) on 02/06/20  If you have labs (blood work) drawn today and your tests are completely normal, you will receive your results only by: Marland Kitchen MyChart Message (if you have MyChart) OR . A paper copy in the mail If you have any lab test that is abnormal or we need to change your treatment, we will call you to review the results.   Testing/Procedures: None   Follow-Up: Follow up with Dr. Gasper Sells on 04/26/2020 at 8:00 AM   Other Instructions None

## 2020-01-24 NOTE — Progress Notes (Signed)
Cardiology Office Note:    Date:  01/24/2020   ID:  TALAN GILDNER, DOB 28-Nov-1940, MRN [id number]  PCP:  Susy Frizzle, MD  [facility] Cardiologist:  Thompson Grayer, MD -> [facility] Electrophysiologist:  Thompson Grayer, MD   CC:  Heart Failure Follow Up  History of Present Illness:    [name] is a 79 y.o. male with a hx of Atrial flutter s/p multiple CT ablations last in 2013 (6+), AT, Likely Obstructive CAD with LCP in 08/2018, Hx of HTN with  HLD on atorvastatin, prior TIA. CKD stage III.  Had Cardiac MRI for evaluation of etiology of HF:  Found to have disease consistent with prior LAD infarct.  Patient notes that he is feeling great.  Feels completely normal.  No chest pain, no SOB, no DOE.  No chest pressure.  Never had angina in 2020.  No Palpitations, no syncope or near syncope.   Is able to do most of his prior activities and has started golfing.    Past Medical History:  Diagnosis Date  . Atrial flutter ([place])    a. s/p CTI ablation x2 by Dr Rayann Heman 10/12, 9/13  . Basal cell carcinoma of cheek   . BPH (benign prostatic hyperplasia)   . CAD (coronary artery disease)    nonobstructive on cath 08/2018  . CHF (congestive heart failure) ([place])    ef 35% on Echo 2020  . CKD (chronic kidney disease) stage 3, GFR 30-59 ml/min (HCC)   . Colon polyps   . Diverticulosis   . Ectopic atrial tachycardia ([place])    a. Dx 07/2015: rate controlled   . Hemorrhoids   . Hyperlipidemia   . Hypertension   . Hypertrophic cardiomyopathy ([place])   . TIA (transient ischemic attack)     Past Surgical History:  Procedure Laterality Date  . ATRIAL ABLATION SURGERY  10/12   CTI ablation for atrial flutter by Dr Rayann Heman  . ATRIAL FLUTTER ABLATION N/A 12/11/2011   CTI ablation for atrial flutter by Dr Rayann Heman  . BASAL CELL CARCINOMA EXCISION  ~ 1973   left cheek  . CARDIAC CATHETERIZATION  1960's  . HERNIA REPAIR  ~ 1740   umbilical  . JOINT REPLACEMENT    .  RIGHT/LEFT HEART CATH AND CORONARY ANGIOGRAPHY N/A 08/26/2018   Procedure: RIGHT/LEFT HEART CATH AND CORONARY ANGIOGRAPHY;  Surgeon: Sherren Mocha, MD;  Location: [facility];  Service: Cardiovascular;  Laterality: N/A;  . TONSILLECTOMY AND ADENOIDECTOMY  1947  . TOTAL HIP ARTHROPLASTY     left   Current Medications: Current Meds  Medication Sig  . atorvastatin (LIPITOR) 40 MG tablet TAKE 1 TABLET BY MOUTH EVERY DAY  . clindamycin (CLEOCIN) 300 MG capsule Take 2 capsules 1hour prior to dental procedure.  . Coenzyme Q10 (COQ10) 200 MG CAPS Take 200 mg by mouth every evening.  Marland Kitchen ELIQUIS 5 MG TABS tablet TAKE 1 TABLET BY MOUTH TWICE A DAY  . ezetimibe (ZETIA) 10 MG tablet Take 1 tablet (10 mg total) by mouth daily.  . Melatonin 5 MG TABS Take 5 mg by mouth at bedtime.  . metoprolol succinate (TOPROL-XL) 25 MG 24 hr tablet Take 0.5 tablets (12.5 mg total) by mouth daily.  . sacubitril-valsartan (ENTRESTO) 49-51 MG Take 1 tablet by mouth 2 (two) times daily.  Marland Kitchen spironolactone (ALDACTONE) 25 MG tablet Take 0.5 tablets (12.5 mg total) by mouth daily. Take in the evening.   Current Facility-Administered Medications for the 01/24/20  encounter (Office Visit) with Werner Lean, MD  Medication  . 0.9 %  sodium chloride infusion    Allergies:   Nsaids, Crestor [rosuvastatin calcium], and Penicillins   Social History   Socioeconomic History  . Marital status: Married    Spouse name: Not on file  . Number of children: 2  . Years of education: Not on file  . Highest education level: Not on file  Occupational History  . Occupation: retired  Tobacco Use  . Smoking status: Former Smoker    Packs/day: 2.00    Years: 10.00    Pack years: 20.00    Types: Cigarettes    Quit date: 03/17/1970    Years since quitting: 49.8  . Smokeless tobacco: Never Used  Vaping Use  . Vaping Use: Never used  Substance and Sexual Activity  . Alcohol use: No    Comment: quit 1995  . Drug use:  No  . Sexual activity: Yes  Other Topics Concern  . Not on file  Social History Narrative   Lives in [place] with spouse.  2 Grown children.  Retired from Data processing manager   Social Determinants of Radio broadcast assistant Strain: [place]   . Difficulty of Paying Living Expenses: Not hard at all  Food Insecurity:   . Worried About Charity fundraiser in the Last Year: Not on file  . Ran Out of Food in the Last Year: Not on file  Transportation Needs:   . Lack of Transportation (Medical): Not on file  . Lack of Transportation (Non-Medical): Not on file  Physical Activity:   . Days of Exercise per Week: Not on file  . Minutes of Exercise per Session: Not on file  Stress:   . Feeling of Stress : Not on file  Social Connections:   . Frequency of Communication with Friends and Family: Not on file  . Frequency of Social Gatherings with Friends and Family: Not on file  . Attends Religious Services: Not on file  . Active Member of Clubs or Organizations: Not on file  . Attends Archivist Meetings: Not on file  . Marital Status: Not on file    Family History: The patient's family history includes Brain cancer in his brother; Breast cancer in his mother; COPD in his father; Colon polyps in his brother; Tuberculosis in his brother and father; Ulcers in his father. There is no history of Colon cancer, Stomach cancer, Rectal cancer, Esophageal cancer, or Liver cancer.  ROS:   Please see the history of present illness.    All other systems reviewed and are negative.  EKGs/Labs/Other Studies Reviewed:    The following studies were reviewed today:  Recent Labs: 11/15/2019: ALT 29; Hemoglobin 14.8; Platelets 209 12/22/2019: NT-Pro BNP 1,101 01/12/2020: BUN 26; Creatinine, Ser 1.29; Potassium 4.9; Sodium 138  Recent Lipid Panel    Component Value Date/Time   CHOL 138 11/15/2019 0922   TRIG 61 11/15/2019 0922   HDL 63 11/15/2019 0922   CHOLHDL 2.2 11/15/2019  0922   CHOLHDL 2.8 01/18/2019 0920   VLDL 19 11/03/2016 0841   LDLCALC 62 11/15/2019 0922   LDLCALC 101 (H) 01/18/2019 0920   Exercise stress test 05/21/18:  No exercise associated VT.  Large anterior perfusion defect.  Normal exercise response  08/26/18 LCP  1.  Nonobstructive coronary artery disease with patency of the left main stem, patency of the LAD with minor nonobstructive disease, patency of the circumflex/intermediate, severe stenosis  at the ostium of the small distal diagonal branch, and mild nonobstructive disease of a large, dominant RCA 2.  Normal intracardiac hemodynamics in both the right and left heart with normal/low intracardiac filling pressures suggestive of well compensated chronic systolic heart failure  6/54/65 Conclusion Visual quantitative assessment is Grade 2 (suggestive of amyloid), however -- H/CL ratio is 1.3 3) Equivocal for TTR amyloidosis (Equivocal: A semi-quantitative visual score of 1 or H/CL ratio 1-1.5)** Consider cMRI to further evaluate for TTR amyloid 01/18/20  11/08/19 Echo Reduced EF with asymmetric thickening and without SAM. 1. Left ventricular ejection fraction, by estimation, is 30%. The left  ventricle has severely decreased function. The left ventricle demonstrates  regional wall motion abnormalities with hypokinesis of the inferior wall,  the inferoseptal wall, the anteroseptal wall, and the apex. There is mild left ventricular hypertrophy. Left ventricular diastolic parameters are consistent with Grade I diastolic dysfunction (impaired relaxation).  2. Right ventricular systolic function is normal. The right ventricular  size is normal. Tricuspid regurgitation signal is inadequate for assessing PA pressure.  3. Left atrial size was mildly dilated.  4. Right atrial size was mildly dilated.  5. The mitral valve is normal in structure. Trivial mitral valve  regurgitation. No evidence of mitral stenosis.  6. The aortic valve is  tricuspid. Aortic valve regurgitation is not  visualized. No aortic stenosis is present.  7. Aortic dilatation noted. There is mild dilatation of the ascending  aorta measuring 37 mm.  8. The inferior vena cava is normal in size with greater than 50%  respiratory variability, suggesting right atrial pressure of 3 mmHg.   Cardiac MRI Normal left ventricular size, thickness and systolic function is (LVEF =43%). There is anteroseptal and inferoseptal (base) hypokinesis with apical hypokinesis. There is no evidence of LVOT obstruction; supine study. There is late gadolinium enhancement in the left ventricular myocardium in the anteroseptal base, and in the anterior, anteroseptal, inferoseptal, and inferior mid segments. There is apical, transmural late gadolinium enhancement.  Physical Exam:    VS:  BP 116/60   Pulse 61   Ht 5\' 10"  (0.354 m)   Wt 175 lb (79.4 kg)   SpO2 98%   BMI 25.11 kg/m     Wt Readings from Last 3 Encounters:  01/24/20 175 lb (79.4 kg)  12/22/19 175 lb (79.4 kg)  12/14/19 172 lb (78 kg)    GEN: Well nourished, well developed in no acute distress HEENT: Normal NECK: No JVD; No carotid bruits LYMPHATICS: No lymphadenopathy CARDIAC: RRR, no murmurs, rubs, gallops no change with handgrip RESPIRATORY:  Clear to auscultation without rales, wheezing or rhonchi  ABDOMEN: Soft, non-tender, non-distended MUSCULOSKELETAL:  No edema; No deformity  SKIN: Warm and dry NEUROLOGIC:  Alert and oriented x 3 PSYCHIATRIC:  Normal affect   ASSESSMENT:    1. HFrEF (heart failure with reduced ejection fraction) ([place])   2. Coronary artery disease involving native coronary artery of native heart without angina pectoris   3. Essential hypertension   4. Mixed hyperlipidemia    PLAN:    In order of problems listed above:  Systolic  Heart Failure with Reduced Ejection Fraction Coronary Artery Disease HTN CKD - NYHA class II, Stage B, euvolemic, etiology from septal  MI - presently, no ICD or CRT indication - Euvolemic- will not add lasix at this time - Metoprolol succinate 12.5 mg daily with heart rates in 40s to 50s.  - ARNI will continue present dose - MRA will increase 25 mg;  BMP in 7-10  - Will defer outpatient SGLT2i at this time; will consider in 2020 of ir patient assistance is available - appreciate assistance with up-titration of GDMT whit pharmacy - no AC on ASA - LDL < 70 on present therapy  Prior diagnosis of borderline TAA without rupture MRI- AA 33 mm.  Will monitor in the setting of cardiac imaging and monitoring.  Atrial Flutter and AT - CHADSVASC 6 on DOAC - will defer to EP at this time presently sinus  3 month follow up for uptitration of medications unless new symptoms or abnormal test results warranting change in plan  Medication Adjustments/Labs and Tests Ordered: Current medicines are reviewed at length with the patient today.  Concerns regarding medicines are outlined above.  No orders of the defined types were placed in this encounter.  No orders of the defined types were placed in this encounter.   There are no Patient Instructions on file for this visit.   Signed, Werner Lean, MD  01/24/2020 8:32 AM    [facility]

## 2020-01-27 ENCOUNTER — Ambulatory Visit: Payer: PPO | Admitting: Internal Medicine

## 2020-02-06 ENCOUNTER — Telehealth: Payer: Self-pay

## 2020-02-06 ENCOUNTER — Ambulatory Visit: Payer: PPO | Admitting: Internal Medicine

## 2020-02-06 ENCOUNTER — Encounter: Payer: Self-pay | Admitting: Internal Medicine

## 2020-02-06 ENCOUNTER — Other Ambulatory Visit: Payer: PPO | Admitting: *Deleted

## 2020-02-06 ENCOUNTER — Other Ambulatory Visit: Payer: Self-pay

## 2020-02-06 VITALS — BP 100/70 | HR 68 | Ht 70.0 in | Wt 176.8 lb

## 2020-02-06 DIAGNOSIS — I4510 Unspecified right bundle-branch block: Secondary | ICD-10-CM | POA: Diagnosis not present

## 2020-02-06 DIAGNOSIS — I10 Essential (primary) hypertension: Secondary | ICD-10-CM | POA: Diagnosis not present

## 2020-02-06 DIAGNOSIS — I5020 Unspecified systolic (congestive) heart failure: Secondary | ICD-10-CM

## 2020-02-06 DIAGNOSIS — Z79899 Other long term (current) drug therapy: Secondary | ICD-10-CM

## 2020-02-06 DIAGNOSIS — E782 Mixed hyperlipidemia: Secondary | ICD-10-CM | POA: Diagnosis not present

## 2020-02-06 DIAGNOSIS — I2510 Atherosclerotic heart disease of native coronary artery without angina pectoris: Secondary | ICD-10-CM | POA: Diagnosis not present

## 2020-02-06 DIAGNOSIS — I428 Other cardiomyopathies: Secondary | ICD-10-CM

## 2020-02-06 LAB — BASIC METABOLIC PANEL
BUN/Creatinine Ratio: 25 — ABNORMAL HIGH (ref 10–24)
BUN: 30 mg/dL — ABNORMAL HIGH (ref 8–27)
CO2: 22 mmol/L (ref 20–29)
Calcium: 9.6 mg/dL (ref 8.6–10.2)
Chloride: 107 mmol/L — ABNORMAL HIGH (ref 96–106)
Creatinine, Ser: 1.22 mg/dL (ref 0.76–1.27)
GFR calc Af Amer: 65 mL/min/{1.73_m2} (ref >59)
GFR calc non Af Amer: 56 mL/min/{1.73_m2} — ABNORMAL LOW (ref >59)
Glucose: 89 mg/dL (ref 65–99)
Potassium: 6.1 mmol/L (ref 3.5–5.2)
Sodium: 140 mmol/L (ref 134–144)

## 2020-02-06 NOTE — Telephone Encounter (Signed)
-----   Message from Werner Lean, MD sent at 02/06/2020  4:42 PM EST ----- Results: K 6.1 without evidence that this is hemolyzed.   Plan: Needs repeat; stop spironlactone and hold Entresto until this is resolved.  Werner Lean, MD

## 2020-02-06 NOTE — Telephone Encounter (Signed)
Patient aware of his elevated Potassium and plans to come in tomorrow morning for repeat BMET. Patient aware to hold Spironolactone and Entresto for the time being.

## 2020-02-06 NOTE — Progress Notes (Signed)
PCP: Susy Frizzle, MD Primary Cardiologist: Dr Gasper Sells  Primary EP: Dr Kinnie Scales is a 79 y.o. male who presents today for routine electrophysiology followup.  Since last being seen in our clinic, the patient reports doing very well.  Today, he denies symptoms of palpitations, chest pain, shortness of breath,  lower extremity edema, dizziness, presyncope, or syncope.  The patient is otherwise without complaint today.   Past Medical History:  Diagnosis Date  . Atrial flutter ([place])    a. s/p CTI ablation x2 by Dr Rayann Heman 10/12, 9/13  . Basal cell carcinoma of cheek   . BPH (benign prostatic hyperplasia)   . CAD (coronary artery disease)    nonobstructive on cath 08/2018  . CHF (congestive heart failure) ([place])    ef 35% on Echo 2020  . CKD (chronic kidney disease) stage 3, GFR 30-59 ml/min (HCC)   . Colon polyps   . Diverticulosis   . Ectopic atrial tachycardia ([place])    a. Dx 07/2015: rate controlled   . Hemorrhoids   . Hyperlipidemia   . Hypertension   . Hypertrophic cardiomyopathy ([place])   . TIA (transient ischemic attack)    Past Surgical History:  Procedure Laterality Date  . ATRIAL ABLATION SURGERY  10/12   CTI ablation for atrial flutter by Dr Rayann Heman  . ATRIAL FLUTTER ABLATION N/A 12/11/2011   CTI ablation for atrial flutter by Dr Rayann Heman  . BASAL CELL CARCINOMA EXCISION  ~ 1973   left cheek  . CARDIAC CATHETERIZATION  1960's  . HERNIA REPAIR  ~ 3664   umbilical  . JOINT REPLACEMENT    . RIGHT/LEFT HEART CATH AND CORONARY ANGIOGRAPHY N/A 08/26/2018   Procedure: RIGHT/LEFT HEART CATH AND CORONARY ANGIOGRAPHY;  Surgeon: Sherren Mocha, MD;  Location: [facility];  Service: Cardiovascular;  Laterality: N/A;  . TONSILLECTOMY AND ADENOIDECTOMY  1947  . TOTAL HIP ARTHROPLASTY     left    ROS- all systems are reviewed and negatives except as per HPI above  Current Outpatient Medications  Medication Sig Dispense Refill  . atorvastatin  (LIPITOR) 40 MG tablet TAKE 1 TABLET BY MOUTH EVERY DAY 90 tablet 1  . clindamycin (CLEOCIN) 300 MG capsule Take 2 capsules 1hour prior to dental procedure. 6 capsule 0  . Coenzyme Q10 (COQ10) 200 MG CAPS Take 200 mg by mouth every evening.    Marland Kitchen ELIQUIS 5 MG TABS tablet TAKE 1 TABLET BY MOUTH TWICE A DAY 180 tablet 1  . ezetimibe (ZETIA) 10 MG tablet Take 1 tablet (10 mg total) by mouth daily. 90 tablet 3  . Melatonin 5 MG TABS Take 5 mg by mouth at bedtime.    . metoprolol succinate (TOPROL-XL) 25 MG 24 hr tablet Take 0.5 tablets (12.5 mg total) by mouth daily. 90 tablet 3  . sacubitril-valsartan (ENTRESTO) 49-51 MG Take 1 tablet by mouth 2 (two) times daily. 180 tablet 3  . spironolactone (ALDACTONE) 25 MG tablet Take 1 tablet (25 mg total) by mouth daily. Take in the evening. 90 tablet 3   Current Facility-Administered Medications  Medication Dose Route Frequency Provider Last Rate Last Admin  . 0.9 %  sodium chloride infusion  500 mL Intravenous Continuous Pyrtle, Lajuan Lines, MD        Physical Exam: Vitals:   02/06/20 1105  BP: 100/70  Pulse: 68  SpO2: 97%  Weight: 176 lb 12.8 oz (80.2 kg)  Height: 5\' 10"  (1.778 m)    GEN-  The patient is well appearing, alert and oriented x 3 today.   Head- normocephalic, atraumatic Eyes-  Sclera clear, conjunctiva pink Ears- hearing intact Oropharynx- clear Lungs- Clear to ausculation bilaterally, normal work of breathing Heart- Regular rate and rhythm, no murmurs, rubs or gallops, PMI not laterally displaced GI- soft, NT, ND, + BS Extremities- no clubbing, cyanosis, or edema  Wt Readings from Last 3 Encounters:  02/06/20 176 lb 12.8 oz (80.2 kg)  01/24/20 175 lb (79.4 kg)  12/22/19 175 lb (79.4 kg)   MRI 01/18/20- EF 43%  EKG tracing ordered today is personally reviewed and shows sinus with first degree AV block, RBBB, left axis deviation  Assessment and Plan:  1. Advanced conduction system disease Stable Asymptomatic Caution with  beta blockers May eventually  Require pacing.  No acute indication.  We discussed at length today.  2. Nonischemic CM EF 43% Follows with Dr Gasper Sells for medicine optimization  3. HTN Stable No change required today  4. Atrial flutter/ atach/ left atrial enlargement Remains in sinus On eliquis for chads2vasc score of 6   Risks, benefits and potential toxicities for medications prescribed and/or refilled reviewed with patient today.   Return to see me in a year  Thompson Grayer MD, [facility] 02/06/2020 11:11 AM

## 2020-02-06 NOTE — Patient Instructions (Signed)
Medication Instructions:  Your physician recommends that you continue on your current medications as directed. Please refer to the Current Medication list given to you today.  *If you need a refill on your cardiac medications before your next appointment, please call your pharmacy*  Lab Work: None ordered.  If you have labs (blood work) drawn today and your tests are completely normal, you will receive your results only by: Marland Kitchen MyChart Message (if you have MyChart) OR . A paper copy in the mail If you have any lab test that is abnormal or we need to change your treatment, we will call you to review the results.  Testing/Procedures: None ordered.  Follow-Up: At [facility], you and your health needs are our priority.  As part of our continuing mission to provide you with exceptional heart care, we have created designated Provider Care Teams.  These Care Teams include your primary Cardiologist (physician) and Advanced Practice Providers (APPs -  Physician Assistants and Nurse Practitioners) who all work together to provide you with the care you need, when you need it.  We recommend signing up for the patient portal called "MyChart".  Sign up information is provided on this After Visit Summary.  MyChart is used to connect with patients for Virtual Visits (Telemedicine).  Patients are able to view lab/test results, encounter notes, upcoming appointments, etc.  Non-urgent messages can be sent to your provider as well.   To learn more about what you can do with MyChart, go to [email or web address].    Your next appointment:   Your physician wants you to follow-up in: 1 year with Dr. Rayann Heman. You will receive a reminder letter in the mail two months in advance. If you don't receive a letter, please call our office to schedule the follow-up appointment.    Other Instructions:

## 2020-02-07 ENCOUNTER — Other Ambulatory Visit: Payer: PPO | Admitting: *Deleted

## 2020-02-07 ENCOUNTER — Encounter: Payer: Self-pay | Admitting: Internal Medicine

## 2020-02-07 ENCOUNTER — Telehealth: Payer: Self-pay | Admitting: Internal Medicine

## 2020-02-07 DIAGNOSIS — Z79899 Other long term (current) drug therapy: Secondary | ICD-10-CM | POA: Diagnosis not present

## 2020-02-07 LAB — BASIC METABOLIC PANEL
BUN/Creatinine Ratio: 21 (ref 10–24)
BUN: 29 mg/dL — ABNORMAL HIGH (ref 8–27)
CO2: 21 mmol/L (ref 20–29)
Calcium: 9 mg/dL (ref 8.6–10.2)
Chloride: 105 mmol/L (ref 96–106)
Creatinine, Ser: 1.36 mg/dL — ABNORMAL HIGH (ref 0.76–1.27)
GFR calc Af Amer: 57 mL/min/{1.73_m2} — ABNORMAL LOW (ref >59)
GFR calc non Af Amer: 49 mL/min/{1.73_m2} — ABNORMAL LOW (ref >59)
Glucose: 98 mg/dL (ref 65–99)
Potassium: 4.6 mmol/L (ref 3.5–5.2)
Sodium: 138 mmol/L (ref 134–144)

## 2020-02-07 NOTE — Telephone Encounter (Signed)
Left message to call office

## 2020-02-07 NOTE — Telephone Encounter (Addendum)
Patient's wife calling for lab results. 

## 2020-02-08 NOTE — Telephone Encounter (Signed)
Left message to call office.  Also left message on home number to call office

## 2020-02-13 ENCOUNTER — Ambulatory Visit: Payer: PPO | Admitting: Internal Medicine

## 2020-02-13 NOTE — Telephone Encounter (Signed)
Called again and left message for patient to call the office

## 2020-02-14 ENCOUNTER — Other Ambulatory Visit: Payer: PPO | Admitting: *Deleted

## 2020-02-14 ENCOUNTER — Other Ambulatory Visit: Payer: Self-pay | Admitting: Internal Medicine

## 2020-02-14 ENCOUNTER — Telehealth: Payer: Self-pay | Admitting: *Deleted

## 2020-02-14 ENCOUNTER — Other Ambulatory Visit: Payer: Self-pay

## 2020-02-14 DIAGNOSIS — I5022 Chronic systolic (congestive) heart failure: Secondary | ICD-10-CM | POA: Diagnosis not present

## 2020-02-14 LAB — BASIC METABOLIC PANEL
BUN/Creatinine Ratio: 22 (ref 10–24)
BUN: 26 mg/dL (ref 8–27)
CO2: 27 mmol/L (ref 20–29)
Calcium: 9.2 mg/dL (ref 8.6–10.2)
Chloride: 106 mmol/L (ref 96–106)
Creatinine, Ser: 1.2 mg/dL (ref 0.76–1.27)
GFR calc Af Amer: 66 mL/min/{1.73_m2} (ref >59)
GFR calc non Af Amer: 57 mL/min/{1.73_m2} — ABNORMAL LOW (ref >59)
Glucose: 67 mg/dL (ref 65–99)
Potassium: 4.4 mmol/L (ref 3.5–5.2)
Sodium: 142 mmol/L (ref 134–144)

## 2020-02-14 NOTE — Telephone Encounter (Signed)
-----   Message from Werner Lean, MD sent at 02/14/2020  1:25 PM EST ----- Results: Improvement in kidney function and potassium Plan: Continue current medications  Werner Lean, MD

## 2020-02-14 NOTE — Telephone Encounter (Signed)
See additional phone encounter dated today.

## 2020-02-14 NOTE — Telephone Encounter (Signed)
Lab results/review by Dr. Gasper Sells were sent to MyChart and have been viewed.

## 2020-02-14 NOTE — Telephone Encounter (Signed)
Left message for patient's wife (DPR) to call back. ?

## 2020-02-14 NOTE — Telephone Encounter (Signed)
Patient aware to continue Entresto.  He has remained on 49-51 mg, not low dose as it looks like we did not speak with him to inform him to decrease.  Will coontinue 49-51 mg BID. No aldactone.  He and his wife verbalize understanding.

## 2020-02-14 NOTE — Telephone Encounter (Signed)
Patient's wife is returning call to review lab results.

## 2020-02-21 ENCOUNTER — Telehealth: Payer: Self-pay | Admitting: Pharmacist

## 2020-02-21 NOTE — Telephone Encounter (Signed)
Patient called stating he is filling out his [employer] (entresto) pt assistance paperwork. I advised he fill out his part and then drop off at the office. We will fill out MD part and fax.

## 2020-02-22 NOTE — Telephone Encounter (Signed)
Patient dropped off patient assistance paperwork and documents. Will have Dr. Rayann Heman sign on Friday when he is here and then fax over to [employer]

## 2020-03-21 ENCOUNTER — Other Ambulatory Visit: Payer: Self-pay | Admitting: Internal Medicine

## 2020-03-21 NOTE — Telephone Encounter (Signed)
Eliquis 5mg  refill request received. Patient is 81 years old, weight-80.2kg, Crea-1.20 on 02/14/2020, Diagnosis-Aflutter, and last seen by Dr. 02/16/2020 on 02/06/2020. Dose is appropriate based on dosing criteria. Will send in refill to requested pharmacy.

## 2020-04-02 ENCOUNTER — Telehealth: Payer: Self-pay | Admitting: Pharmacist

## 2020-04-02 NOTE — Progress Notes (Addendum)
Chronic Care Management Pharmacy Assistant   Name: [name]  MRN: [id number] DOB: 12/14/1940  Reason for Encounter: Disease State for CHL.  Patient Questions:  1.  Have you seen any other providers since your last visit? Yes   2.  Any changes in your medicines or health? Yes.   PCP : Susy Frizzle, MD   Their chronic conditions include: hypertension, CHF, CAD, hyperlipidemia, history of TIA.  Office Visits: 11/24/19 Dr. Dennard Schaumann Lab work review. No medication changes.  Consults: 02/14/20 Cardio (Telephone) Azucena Fallen STOP Spironolactone 25 mg. 02/06/20 Cardio (Telephone) Hold Spironolactone and Bess Kinds. 02/06/20 Cardio Dr. Rayann Heman No mediation changes.  01/24/20 Cardio Dr. Pollie Friar Spironolactone 25 mg daily. 01/05/20 Cardio Maccia, Melissa D, RPH-CPP STARTED Spironolactone 12.5 mg(1/2 tab)  at night. 12/22/19 Cardio Dr. Rachell Cipro.  No medication changes.  12/14/19 Cardio Maccia, Melissa D, RPH-CPP STARTED Metoprolol Succinate 12.5 mg daily. 12/08/19 INCREASE Entresto to 49/51 BID. 12/08/19 Dr. Pearline Cables for Scar conditions. No information given.  12/08/19 Dermatopathology Dr. Prince Rome No information given.  12/07/19 Cardio Thompson Grayer, MD No medication changes.  12/01/19 Cardio Maccia, Melissa D, RPH-CPP CHANGED Entresto dose 1 tablet by mouth in the morning and take two tablets by mouth in the evening.  11/15/19 Cardio Maccia, Melissa D, RPH-CPP STOPPED Lisinopril 10 mg . STARTED Entresto 24-26 mg 1 tablet 2 times daily. COMPLETED Methylprednisolone 4 mg.  Allergies:   Allergies  Allergen Reactions   Aldactone [Spironolactone]     Hyperkalemia   Nsaids Other (See Comments)    GI  Upset    Crestor [Rosuvastatin Calcium] Other (See Comments)    Joint pain   Penicillins Other (See Comments)    Unknown reaction Did it involve swelling of the face/tongue/throat, SOB, or low BP? Unknown Did it involve sudden or severe rash/hives, skin  peeling, or any reaction on the inside of your mouth or nose? Unknown Did you need to seek medical attention at a hospital or doctor's office? Unknown When did it last happen? More than 50 years ago   If all above answers are "NO", may proceed with cephalosporin use.     Medications: Outpatient Encounter Medications as of 04/02/2020  Medication Sig   atorvastatin (LIPITOR) 40 MG tablet TAKE 1 TABLET BY MOUTH EVERY DAY   clindamycin (CLEOCIN) 300 MG capsule Take 2 capsules 1hour prior to dental procedure.   Coenzyme Q10 (COQ10) 200 MG CAPS Take 200 mg by mouth every evening.   ELIQUIS 5 MG TABS tablet TAKE 1 TABLET BY MOUTH TWICE A DAY   ezetimibe (ZETIA) 10 MG tablet Take 1 tablet (10 mg total) by mouth daily.   Melatonin 5 MG TABS Take 5 mg by mouth at bedtime.   metoprolol succinate (TOPROL-XL) 25 MG 24 hr tablet Take 0.5 tablets (12.5 mg total) by mouth daily.   sacubitril-valsartan (ENTRESTO) 49-51 MG Take 1 tablet by mouth 2 (two) times daily.   Facility-Administered Encounter Medications as of 04/02/2020  Medication   0.9 %  sodium chloride infusion    Current Diagnosis: Patient Active Problem List   Diagnosis Date Noted   HFrEF (heart failure with reduced ejection fraction) ([place]) 01/24/2020   Pain in left hip 05/04/2019   Coronary artery disease involving native coronary artery of native heart without angina pectoris    CHF (congestive heart failure) ([place])    Chronic systolic CHF (congestive heart failure) ([place]) 10/05/2018   Abnormal nuclear cardiac imaging test 08/26/2018   Dehydration 07/21/2015  Ectopic atrial tachycardia (HCC)    Hypotension 07/20/2015   Fatigue 07/20/2015   Atrial flutter ([place]) 11/20/2010   Personal history of colonic polyps 10/28/2010   Diverticulitis    Hemorrhoids, internal    BPH (benign prostatic hypertrophy)    Hx-TIA (transient ischemic attack)    Hypertrophic nonobstructive cardiomyopathy ([place]) 08/15/2010   Essential hypertension  03/05/2009   Mixed hyperlipidemia 03/01/2009    Goals Addressed   None    04/02/2020 Name: [name] MRN: [id number] DOB: 1940/11/25 [name] is a 80 y.o. year old male who is a primary care patient of Susy Frizzle, MD.  Comprehensive medication review performed; Spoke to patient regarding cholesterol  Lipid Panel    Component Value Date/Time   CHOL 138 11/15/2019 0922   TRIG 61 11/15/2019 0922   HDL 63 11/15/2019 0922   LDLCALC 62 11/15/2019 0922   LDLCALC 101 (H) 01/18/2019 0920    10-year ASCVD risk score: The 10-year ASCVD risk score Mikey Bussing DC Brooke Bonito., et al., 2013) is: 21.6%   Values used to calculate the score:     Age: 81 years     Sex: Male     Is Non-Hispanic African American: No     Diabetic: No     Tobacco smoker: No     Systolic Blood Pressure: 937 mmHg     Is BP treated: Yes     HDL Cholesterol: 63 mg/dL     Total Cholesterol: 138 mg/dL  Current antihyperlipidemic regimen:  Atorvastatin 40 mg daily Zetia 10 mg daily  Previous antihyperlipidemic medications tried: Repatha ([place])  ASCVD risk enhancing conditions: age >73, HTN and CHF   What recent interventions/DTPs have been made by any provider to improve Cholesterol control since last CPP Visit: None.  Any recent hospitalizations or ED visits since last visit with CPP? No.   What diet changes have been made to improve Cholesterol?  Patient wife stated he eats a lot of chicken, fish, vegetables and some beef. She stated he stays away from bread and dessert.  What exercise is being done to improve Cholesterol? Patient wife stated he has a member of the [facility]. Patient stated he does yard work and sometimes he is outside for 4-5 hours at a time.   Adherence Review: Does the patient have >5 day gap between last estimated fill dates? No, CPP Please Check.  Patient wife stated he uses [facility] for almost all of this medications and is aware that they are able to call me if they have any concerns about  their medication.   Follow-Up:  Pharmacist Review   Charlann Lange, RMA Clinical Pharmacist Assistant [phone]  5 minutes spent in review, coordination, and documentation.  Reviewed by: Beverly Milch, PharmD Clinical Pharmacist [facility] [phone]

## 2020-04-06 ENCOUNTER — Telehealth: Payer: Self-pay | Admitting: Pharmacist

## 2020-04-06 NOTE — Progress Notes (Signed)
    Chronic Care Management Pharmacy Assistant   Name: ANITA MCADORY  MRN: [id number] DOB: [birth year]  Reason for Encounter: Adherence Review  Patient Questions:  1.  Have you seen any other providers since your last visit? No.   2.  Any changes in your medicines or health? No.    PCP : Susy Frizzle, MD  Verified Adherence Gap Information. Per insurance data, the patient is 100% compliant with the following medication: Atorvastatin 40 mg. The patient is 90-99% compliant with the following medication: Lisinopril 10 mg. The patient has not met their annual wellness or their wellness bundle screening. Their total gap measures is equal to 2. Their most recent blood pressure was 126/80 on 01/05/20, they met their goal with keeping their blood pressure less than 140/90.   Follow-Up:  Pharmacist Review

## 2020-04-19 DIAGNOSIS — H40023 Open angle with borderline findings, high risk, bilateral: Secondary | ICD-10-CM | POA: Diagnosis not present

## 2020-04-19 DIAGNOSIS — H5203 Hypermetropia, bilateral: Secondary | ICD-10-CM | POA: Diagnosis not present

## 2020-04-26 ENCOUNTER — Ambulatory Visit: Payer: PPO | Admitting: Internal Medicine

## 2020-04-26 ENCOUNTER — Encounter: Payer: Self-pay | Admitting: Internal Medicine

## 2020-04-26 ENCOUNTER — Telehealth: Payer: Self-pay

## 2020-04-26 ENCOUNTER — Other Ambulatory Visit: Payer: Self-pay

## 2020-04-26 VITALS — BP 128/80 | HR 62 | Ht 70.0 in | Wt 177.6 lb

## 2020-04-26 DIAGNOSIS — E782 Mixed hyperlipidemia: Secondary | ICD-10-CM

## 2020-04-26 DIAGNOSIS — I2510 Atherosclerotic heart disease of native coronary artery without angina pectoris: Secondary | ICD-10-CM | POA: Diagnosis not present

## 2020-04-26 DIAGNOSIS — I10 Essential (primary) hypertension: Secondary | ICD-10-CM | POA: Diagnosis not present

## 2020-04-26 DIAGNOSIS — I5022 Chronic systolic (congestive) heart failure: Secondary | ICD-10-CM

## 2020-04-26 DIAGNOSIS — I4892 Unspecified atrial flutter: Secondary | ICD-10-CM

## 2020-04-26 MED ORDER — METOPROLOL SUCCINATE ER 25 MG PO TB24: 12.5000 mg | ORAL_TABLET | Freq: Every day | ORAL | 1 refills | Status: DC

## 2020-04-26 NOTE — Patient Instructions (Signed)
Medication Instructions:   RESTART:  Metoprolol succinate (Toprol XL) 12.5mg  daily  *If you need a refill on your cardiac medications before your next appointment, please call your pharmacy*   Lab Work: NONE If you have labs (blood work) drawn today and your tests are completely normal, you will receive your results only by: Marland Kitchen MyChart Message (if you have MyChart) OR . A paper copy in the mail If you have any lab test that is abnormal or we need to change your treatment, we will call you to review the results.   Testing/Procedures: NONE   Follow-Up: At [facility], you and your health needs are our priority.  As part of our continuing mission to provide you with exceptional heart care, we have created designated Provider Care Teams.  These Care Teams include your primary Cardiologist (physician) and Advanced Practice Providers (APPs -  Physician Assistants and Nurse Practitioners) who all work together to provide you with the care you need, when you need it.  We recommend signing up for the patient portal called "MyChart".  Sign up information is provided on this After Visit Summary.  MyChart is used to connect with patients for Virtual Visits (Telemedicine).  Patients are able to view lab/test results, encounter notes, upcoming appointments, etc.  Non-urgent messages can be sent to your provider as well.   To learn more about what you can do with MyChart, go to [email or web address].    Your next appointment:   6 month(s)  The format for your next appointment:   In Person  Provider:   You may see Gasper Sells, MD or one of the following Advanced Practice Providers on your designated Care Team:    Melina Copa, PA-C  Ermalinda Barrios, PA-C

## 2020-04-26 NOTE — Progress Notes (Signed)
Cardiology Office Note:    Date:  04/26/2020   ID:  [name], DOB 1940-09-27, MRN [id number]  PCP:  Susy Frizzle, MD  [facility] Cardiologist: Rudean Haskell MD [place] Electrophysiologist:  Thompson Grayer, MD   CC:  Follow up HFrEF  History of Present Illness:    [name] is a 80 y.o. male with a hx of Atrial flutter s/p multiple CT ablations last in 2013 (6+), AT, Suspicion for Obstructive CAD with LCP in 08/2018 and scar on MRI, Hx of HTN with  HLD , prior TIA. CKD stage III who presented for evaluation for HCM eval 12/22/19.  In interim from this visit had Cardiac MRI for evaluation of etiology of HF:  Found to have disease consistent with prior LAD infarct and improvement of LVEF.  In interim from 11/9 saw Dr. Rayann Heman with no changes need and get paperwork done for [facility].  Patient notes that he is doing well.  Since ast visit notes  changes.  Relevant interval testing or therapy include .  There are no interval hospital/ED visit.    No chest pain or pressure .  No SOB/DOE and no PND/Orthopnea.  No weight gain or leg swelling.  No palpitations or syncope.  Ambulatory blood pressure 112/60.  Logs reviewed:  Likely stopped metoprolol around 12/22 after reviewing his logs.   Past Medical History:  Diagnosis Date  . Atrial flutter ([place])    a. s/p CTI ablation x2 by Dr Rayann Heman 10/12, 9/13  . Basal cell carcinoma of cheek   . BPH (benign prostatic hyperplasia)   . CAD (coronary artery disease)    nonobstructive on cath 08/2018  . CHF (congestive heart failure) ([place])    ef 35% on Echo 2020  . CKD (chronic kidney disease) stage 3, GFR 30-59 ml/min (HCC)   . Colon polyps   . Diverticulosis   . Ectopic atrial tachycardia ([place])    a. Dx 07/2015: rate controlled   . Hemorrhoids   . Hyperlipidemia   . Hypertension   . Hypertrophic cardiomyopathy ([place])   . TIA (transient ischemic attack)     Past Surgical History:  Procedure Laterality Date  . ATRIAL  ABLATION SURGERY  10/12   CTI ablation for atrial flutter by Dr Rayann Heman  . ATRIAL FLUTTER ABLATION N/A 12/11/2011   CTI ablation for atrial flutter by Dr Rayann Heman  . BASAL CELL CARCINOMA EXCISION  ~ 1973   left cheek  . CARDIAC CATHETERIZATION  1960's  . HERNIA REPAIR  ~ 5009   umbilical  . JOINT REPLACEMENT    . RIGHT/LEFT HEART CATH AND CORONARY ANGIOGRAPHY N/A 08/26/2018   Procedure: RIGHT/LEFT HEART CATH AND CORONARY ANGIOGRAPHY;  Surgeon: Sherren Mocha, MD;  Location: [facility];  Service: Cardiovascular;  Laterality: N/A;  . TONSILLECTOMY AND ADENOIDECTOMY  1947  . TOTAL HIP ARTHROPLASTY     left   Current Medications: Current Meds  Medication Sig  . atorvastatin (LIPITOR) 40 MG tablet TAKE 1 TABLET BY MOUTH EVERY DAY  . clindamycin (CLEOCIN) 300 MG capsule Take 2 capsules 1hour prior to dental procedure.  . Coenzyme Q10 (COQ10) 200 MG CAPS Take 200 mg by mouth every evening.  Marland Kitchen ELIQUIS 5 MG TABS tablet TAKE 1 TABLET BY MOUTH TWICE A DAY  . ezetimibe (ZETIA) 10 MG tablet Take 1 tablet (10 mg total) by mouth daily.  . Melatonin 5 MG TABS Take 5 mg by mouth at bedtime.  . metoprolol succinate (TOPROL XL)  25 MG 24 hr tablet Take 0.5 tablets (12.5 mg total) by mouth daily.  . sacubitril-valsartan (ENTRESTO) 49-51 MG Take 1 tablet by mouth 2 (two) times daily.   Current Facility-Administered Medications for the 04/26/20 encounter (Office Visit) with Werner Lean, MD  Medication  . 0.9 %  sodium chloride infusion    Allergies:   Aldactone [spironolactone], Nsaids, Crestor [rosuvastatin calcium], and Penicillins   Social History   Socioeconomic History  . Marital status: Married    Spouse name: Not on file  . Number of children: 2  . Years of education: Not on file  . Highest education level: Not on file  Occupational History  . Occupation: retired  Tobacco Use  . Smoking status: Former Smoker    Packs/day: 2.00    Years: 10.00    Pack years: 20.00     Types: Cigarettes    Quit date: 03/17/1970    Years since quitting: 50.1  . Smokeless tobacco: Never Used  Vaping Use  . Vaping Use: Never used  Substance and Sexual Activity  . Alcohol use: No    Comment: quit 1995  . Drug use: No  . Sexual activity: Yes  Other Topics Concern  . Not on file  Social History Narrative   Lives in [place] with spouse.  2 Grown children.  Retired from Data processing manager   Social Determinants of Radio broadcast assistant Strain: [place]   . Difficulty of Paying Living Expenses: Not hard at all  Food Insecurity: Not on file  Transportation Needs: Not on file  Physical Activity: Not on file  Stress: Not on file  Social Connections: Not on file    SOCIAL:  Going back to the [facility] and doing treadmill and light weights, Married  Family History: The patient's family history includes Brain cancer in his brother; Breast cancer in his mother; COPD in his father; Colon polyps in his brother; Tuberculosis in his brother and father; Ulcers in his father. There is no history of Colon cancer, Stomach cancer, Rectal cancer, Esophageal cancer, or Liver cancer.  ROS:   Please see the history of present illness.    All other systems reviewed and are negative.  EKGs/Labs/Other Studies Reviewed:    The following studies were reviewed today:  Cardiac MRI: Date: 01/18/20 Results: Normal left ventricular size, thickness and systolic function is (LVEF =43%). There is anteroseptal and inferoseptal (base) hypokinesis with apical hypokinesis. There is no evidence of LVOT obstruction; supine study. There is late gadolinium enhancement in the left ventricular myocardium in the anteroseptal base, and in the anterior, anteroseptal, inferoseptal, and inferior mid segments. There is apical, transmural late gadolinium enhancement.  Transthoracic Echocardiogram: Date: 11/08/19 Results: Echo Reduced EF with asymmetric thickening and without SAM. 1. Left  ventricular ejection fraction, by estimation, is 30%. The left  ventricle has severely decreased function. The left ventricle demonstrates  regional wall motion abnormalities with hypokinesis of the inferior wall,  the inferoseptal wall, the anteroseptal wall, and the apex. There is mild left ventricular hypertrophy. Left ventricular diastolic parameters are consistent with Grade I diastolic dysfunction (impaired relaxation).  2. Right ventricular systolic function is normal. The right ventricular  size is normal. Tricuspid regurgitation signal is inadequate for assessing PA pressure.  3. Left atrial size was mildly dilated.  4. Right atrial size was mildly dilated.  5. The mitral valve is normal in structure. Trivial mitral valve  regurgitation. No evidence of mitral stenosis.  6. The aortic  valve is tricuspid. Aortic valve regurgitation is not  visualized. No aortic stenosis is present.  7. Aortic dilatation noted. There is mild dilatation of the ascending  aorta measuring 37 mm.  8. The inferior vena cava is normal in size with greater than 50%  respiratory variability, suggesting right atrial pressure of 3 mmHg.   Nuclear Medicine Stress Test:  05/21/18:   No exercise associated VT.  Large anterior perfusion defect.  Normal exercise response  NM Stress Testing : Date: 11/24/19 Results: 12/14/19 Conclusion Visual quantitative assessment is Grade 2 (suggestive of amyloid), however -- H/CL ratio is 1.3 3) Equivocal for TTR amyloidosis (Equivocal: A semi-quantitative visual score of 1 or H/CL ratio 1-1.5)** Consider cMRI to further evaluate for TTR amyloid  Left/Right Heart Catheterizations: Date: 08/26/2018 Results: 1.  Nonobstructive coronary artery disease with patency of the left main stem, patency of the LAD with minor nonobstructive disease, patency of the circumflex/intermediate, severe stenosis at the ostium of the small distal diagonal branch, and mild nonobstructive  disease of a large, dominant RCA 2.  Normal intracardiac hemodynamics in both the right and left heart with normal/low intracardiac filling pressures suggestive of well compensated chronic systolic heart failure  Recent Labs: 11/15/2019: ALT 29; Hemoglobin 14.8; Platelets 209 12/22/2019: NT-Pro BNP 1,101 02/14/2020: BUN 26; Creatinine, Ser 1.20; Potassium 4.4; Sodium 142  Recent Lipid Panel    Component Value Date/Time   CHOL 138 11/15/2019 0922   TRIG 61 11/15/2019 0922   HDL 63 11/15/2019 0922   CHOLHDL 2.2 11/15/2019 0922   CHOLHDL 2.8 01/18/2019 0920   VLDL 19 11/03/2016 0841   LDLCALC 62 11/15/2019 0922   LDLCALC 101 (H) 01/18/2019 0920    Physical Exam:    VS:  BP 128/80   Pulse 62   Ht 5\' 10"  (1.778 m)   Wt 177 lb 9.6 oz (80.6 kg)   SpO2 96%   BMI 25.48 kg/m     Wt Readings from Last 3 Encounters:  04/26/20 177 lb 9.6 oz (80.6 kg)  02/06/20 176 lb 12.8 oz (80.2 kg)  01/24/20 175 lb (79.4 kg)   GEN: Well nourished, well developed in no acute distress HEENT: Normal NECK: No JVD; No carotid bruits LYMPHATICS: No lymphadenopathy CARDIAC: RRR, no murmurs, rubs, gallops no change with handgrip RESPIRATORY:  Clear to auscultation without rales, wheezing or rhonchi  ABDOMEN: Soft, non-tender, non-distended MUSCULOSKELETAL:  No edema; No deformity  SKIN: Warm and dry NEUROLOGIC:  Alert and oriented x 3 PSYCHIATRIC:  Normal affect   ASSESSMENT:    1. Chronic systolic CHF (congestive heart failure) ([place])   2. Essential hypertension   3. Atrial flutter, unspecified type ([place])   4. Coronary artery disease involving native coronary artery of native heart without angina pectoris   5. Mixed hyperlipidemia    PLAN:    In order of problems listed above:  Systolic  Heart Failure with Reduced Ejection Fraction HTN CKD Stage III - NYHA class II, Stage B, euvolemic, etiology from septal MI - presently, no ICD or CRT indication - Euvolemic- will not add lasix at this  time - Will return Metoprolol succinate 12.5 mg daily with heart rates in 50s on this dose - ARNI will continue present dose; discussed with patient that if  His BP is persistently above 120/80, we can attempt the higher dose; would like to return his BB first given marginal pressure room - Hyperkalemia with aldactone - Will defer outpatient SGLT2i at this time; will consider in 2020  of ir patient assistance is available (Patient already having financial issues with entresto) - appreciate assistance with up-titration of GDMT whit pharmacy - on [facility] no ASA  Coronary Artery Disease; Obstructive septal perforator - asymptomatic - continue DOAC - continue statin and zetia, goal LDL < 70 - continue BB  Prior diagnosis of borderline TAA without rupture MRI- AA 33 mm.  Will monitor in the setting of cardiac imaging and monitoring.  Atrial Flutter and AT - CHADSVASC 6 on DOAC - will defer to EP at this time presently sinus  6 month follow up unless new symptoms or abnormal test results warranting change in plan  Would be reasonable for  APP Follow up  Medication Adjustments/Labs and Tests Ordered: Current medicines are reviewed at length with the patient today.  Concerns regarding medicines are outlined above.  No orders of the defined types were placed in this encounter.  Meds ordered this encounter  Medications  . metoprolol succinate (TOPROL XL) 25 MG 24 hr tablet    Sig: Take 0.5 tablets (12.5 mg total) by mouth daily.    Dispense:  90 tablet    Refill:  1    Patient Instructions  Medication Instructions:   RESTART:  Metoprolol succinate (Toprol XL) 12.5mg  daily  *If you need a refill on your cardiac medications before your next appointment, please call your pharmacy*   Lab Work: NONE If you have labs (blood work) drawn today and your tests are completely normal, you will receive your results only by: Marland Kitchen MyChart Message (if you have MyChart) OR . A paper copy in the mail If  you have any lab test that is abnormal or we need to change your treatment, we will call you to review the results.   Testing/Procedures: NONE   Follow-Up: At [facility], you and your health needs are our priority.  As part of our continuing mission to provide you with exceptional heart care, we have created designated Provider Care Teams.  These Care Teams include your primary Cardiologist (physician) and Advanced Practice Providers (APPs -  Physician Assistants and Nurse Practitioners) who all work together to provide you with the care you need, when you need it.  We recommend signing up for the patient portal called "MyChart".  Sign up information is provided on this After Visit Summary.  MyChart is used to connect with patients for Virtual Visits (Telemedicine).  Patients are able to view lab/test results, encounter notes, upcoming appointments, etc.  Non-urgent messages can be sent to your provider as well.   To learn more about what you can do with MyChart, go to [email or web address].    Your next appointment:   6 month(s)  The format for your next appointment:   In Person  Provider:   You may see Gasper Sells, MD or one of the following Advanced Practice Providers on your designated Care Team:    Melina Copa, PA-C  Ermalinda Barrios, PA-C         Signed, Werner Lean, MD  04/26/2020 9:21 AM    [place]

## 2020-05-28 NOTE — Telephone Encounter (Signed)
Pt approved for 2022 patient assistance through [employer] for [place]. Called pt and LVM for him to return call.

## 2020-06-21 ENCOUNTER — Other Ambulatory Visit: Payer: Self-pay | Admitting: *Deleted

## 2020-06-21 MED ORDER — ATORVASTATIN CALCIUM 40 MG PO TABS
1.0000 | ORAL_TABLET | Freq: Every day | ORAL | 0 refills | Status: DC
Start: 2020-06-21 — End: 2020-08-23

## 2020-07-03 ENCOUNTER — Other Ambulatory Visit: Payer: PPO

## 2020-07-03 ENCOUNTER — Other Ambulatory Visit: Payer: Self-pay

## 2020-07-03 DIAGNOSIS — E785 Hyperlipidemia, unspecified: Secondary | ICD-10-CM | POA: Diagnosis not present

## 2020-07-03 DIAGNOSIS — I10 Essential (primary) hypertension: Secondary | ICD-10-CM

## 2020-07-03 DIAGNOSIS — Z125 Encounter for screening for malignant neoplasm of prostate: Secondary | ICD-10-CM | POA: Diagnosis not present

## 2020-07-04 LAB — COMPLETE METABOLIC PANEL WITH GFR
AG Ratio: 2.1 (calc) (ref 1.0–2.5)
ALT: 19 U/L (ref 9–46)
AST: 20 U/L (ref 10–35)
Albumin: 3.9 g/dL (ref 3.6–5.1)
Alkaline phosphatase (APISO): 25 U/L — ABNORMAL LOW (ref 35–144)
BUN/Creatinine Ratio: 23 (calc) — ABNORMAL HIGH (ref 6–22)
BUN: 27 mg/dL — ABNORMAL HIGH (ref 7–25)
CO2: 27 mmol/L (ref 20–32)
Calcium: 9.1 mg/dL (ref 8.6–10.3)
Chloride: 107 mmol/L (ref 98–110)
Creat: 1.19 mg/dL — ABNORMAL HIGH (ref 0.70–1.11)
GFR, Est African American: 66 mL/min/{1.73_m2} (ref >=60)
GFR, Est Non African American: 57 mL/min/{1.73_m2} — ABNORMAL LOW (ref >=60)
Globulin: 1.9 g/dL (calc) (ref 1.9–3.7)
Glucose, Bld: 95 mg/dL (ref 65–99)
Potassium: 4.7 mmol/L (ref 3.5–5.3)
Sodium: 141 mmol/L (ref 135–146)
Total Bilirubin: 0.6 mg/dL (ref 0.2–1.2)
Total Protein: 5.8 g/dL — ABNORMAL LOW (ref 6.1–8.1)

## 2020-07-04 LAB — CBC WITH DIFFERENTIAL/PLATELET
Absolute Monocytes: 767 cells/uL (ref 200–950)
Basophils Absolute: 28 cells/uL (ref 0–200)
Basophils Relative: 0.4 %
Eosinophils Absolute: 227 cells/uL (ref 15–500)
Eosinophils Relative: 3.2 %
HCT: 43.3 % (ref 38.5–50.0)
Hemoglobin: 14.1 g/dL (ref 13.2–17.1)
Lymphs Abs: 2748 cells/uL (ref 850–3900)
MCH: 30.7 pg (ref 27.0–33.0)
MCHC: 32.6 g/dL (ref 32.0–36.0)
MCV: 94.3 fL (ref 80.0–100.0)
MPV: 10.7 fL (ref 7.5–12.5)
Monocytes Relative: 10.8 %
Neutro Abs: 3330 cells/uL (ref 1500–7800)
Neutrophils Relative %: 46.9 %
Platelets: 153 10*3/uL (ref 140–400)
RBC: 4.59 10*6/uL (ref 4.20–5.80)
RDW: 12.2 % (ref 11.0–15.0)
Total Lymphocyte: 38.7 %
WBC: 7.1 10*3/uL (ref 3.8–10.8)

## 2020-07-04 LAB — PSA: PSA: 2.52 ng/mL (ref <=4.0)

## 2020-07-04 LAB — LIPID PANEL
Cholesterol: 131 mg/dL (ref <200)
HDL: 57 mg/dL (ref >=40)
LDL Cholesterol (Calc): 59 mg/dL (calc)
Non-HDL Cholesterol (Calc): 74 mg/dL (calc) (ref <130)
Total CHOL/HDL Ratio: 2.3 (calc) (ref <5.0)
Triglycerides: 66 mg/dL (ref <150)

## 2020-07-04 LAB — TSH: TSH: 3.26 mIU/L (ref 0.40–4.50)

## 2020-07-10 ENCOUNTER — Encounter: Payer: Self-pay | Admitting: Family Medicine

## 2020-07-10 ENCOUNTER — Ambulatory Visit (INDEPENDENT_AMBULATORY_CARE_PROVIDER_SITE_OTHER): Payer: PPO | Admitting: Family Medicine

## 2020-07-10 ENCOUNTER — Other Ambulatory Visit: Payer: Self-pay

## 2020-07-10 VITALS — BP 128/74 | HR 62 | Temp 98.1°F | Resp 14 | Ht 70.0 in | Wt 177.0 lb

## 2020-07-10 DIAGNOSIS — E7800 Pure hypercholesterolemia, unspecified: Secondary | ICD-10-CM

## 2020-07-10 DIAGNOSIS — Z8673 Personal history of transient ischemic attack (TIA), and cerebral infarction without residual deficits: Secondary | ICD-10-CM

## 2020-07-10 DIAGNOSIS — I4892 Unspecified atrial flutter: Secondary | ICD-10-CM

## 2020-07-10 DIAGNOSIS — I10 Essential (primary) hypertension: Secondary | ICD-10-CM | POA: Diagnosis not present

## 2020-07-10 DIAGNOSIS — I5020 Unspecified systolic (congestive) heart failure: Secondary | ICD-10-CM | POA: Diagnosis not present

## 2020-07-10 DIAGNOSIS — I2510 Atherosclerotic heart disease of native coronary artery without angina pectoris: Secondary | ICD-10-CM

## 2020-07-10 NOTE — Progress Notes (Signed)
Subjective:    Patient ID: [name], male    DOB: 1941/01/28, 80 y.o.   MRN: [id number]   Patient is a very pleasant 80 year old Caucasian male with a history of atrial flutter status post ablation x2, nonobstructive coronary artery disease, and congestive heart failure with an ejection fraction of 35% on echocardiogram in 2020.  Repeat echo 8/21 showed EF of 30%.  Patient had a cardiac MRI that showed an ejection fraction of 43% recently.  He states that he feels fine.  He is about to go play golf today.  He denies any chest pain shortness of breath or dyspnea on exertion.  In September, his cardiologist tried him on spironolactone and he had a significantly elevated potassium level greater than 6.  Therefore I am hesitant to reintroduce Aldactone at this time.  He is not on Jardiance.  We had a long discussion about the reduced morbidity and mortality associated with heart disease on this medication and he would like to consider it.  Otherwise he is doing well.  His most recent lab work is listed below.   Lab on 07/03/2020  Component Date Value Ref Range Status  . Cholesterol 07/03/2020 131  <200 mg/dL Final  . HDL 07/03/2020 57  > OR = 40 mg/dL Final  . Triglycerides 07/03/2020 66  <150 mg/dL Final  . LDL Cholesterol (Calc) 07/03/2020 59  mg/dL (calc) Final   Comment: Reference range: <100 . Desirable range <100 mg/dL for primary prevention;   <70 mg/dL for patients with CHD or diabetic patients  with > or = 2 CHD risk factors. Marland Kitchen LDL-C is now calculated using the Martin-Hopkins  calculation, which is a validated novel method providing  better accuracy than the Friedewald equation in the  estimation of LDL-C.  [name] et al. Annamaria Helling. [id number]): 2061-2068  (http://education.[facility].com/faq/FAQ164)   . Total CHOL/HDL Ratio 07/03/2020 2.3  <5.0 (calc) Final  . Non-HDL Cholesterol (Calc) 07/03/2020 74  <130 mg/dL (calc) Final   Comment: For patients with diabetes plus 1  major ASCVD risk  factor, treating to a non-HDL-C goal of <100 mg/dL  (LDL-C of <70 mg/dL) is considered a therapeutic  option.   . Glucose, Bld 07/03/2020 95  65 - 99 mg/dL Final   Comment: .            Fasting reference interval .   . BUN 07/03/2020 27* 7 - 25 mg/dL Final  . Creat 07/03/2020 1.19* 0.70 - 1.11 mg/dL Final   Comment: For patients >34 years of age, the reference limit for Creatinine is approximately 13% higher for people identified as African-American. .   . GFR, Est Non African American 07/03/2020 57* > OR = 60 mL/min/1.23m2 Final  . GFR, Est African American 07/03/2020 66  > OR = 60 mL/min/1.11m2 Final  . BUN/Creatinine Ratio 07/03/2020 23* 6 - 22 (calc) Final  . Sodium 07/03/2020 141  135 - 146 mmol/L Final  . Potassium 07/03/2020 4.7  3.5 - 5.3 mmol/L Final  . Chloride 07/03/2020 107  98 - 110 mmol/L Final  . CO2 07/03/2020 27  20 - 32 mmol/L Final  . Calcium 07/03/2020 9.1  8.6 - 10.3 mg/dL Final  . Total Protein 07/03/2020 5.8* 6.1 - 8.1 g/dL Final  . Albumin 07/03/2020 3.9  3.6 - 5.1 g/dL Final  . Globulin 07/03/2020 1.9  1.9 - 3.7 g/dL (calc) Final  . AG Ratio 07/03/2020 2.1  1.0 - 2.5 (calc) Final  . Total Bilirubin 07/03/2020 0.6  0.2 - 1.2 mg/dL Final  . Alkaline phosphatase (APISO) 07/03/2020 25* 35 - 144 U/L Final  . AST 07/03/2020 20  10 - 35 U/L Final  . ALT 07/03/2020 19  9 - 46 U/L Final  . PSA 07/03/2020 2.52  < OR = 4.0 ng/mL Final   Comment: The total PSA value from this assay system is  standardized against the WHO standard. The test  result will be approximately 20% lower when compared  to the equimolar-standardized total PSA (Beckman  Coulter). Comparison of serial PSA results should be  interpreted with this fact in mind. . This test was performed using the Siemens  chemiluminescent method. Values obtained from  different assay methods cannot be used interchangeably. PSA levels, regardless of value, should not be interpreted as  absolute evidence of the presence or absence of disease.   Marland Kitchen TSH 07/03/2020 3.26  0.40 - 4.50 mIU/L Final  . WBC 07/03/2020 7.1  3.8 - 10.8 Thousand/uL Final  . RBC 07/03/2020 4.59  4.20 - 5.80 Million/uL Final  . Hemoglobin 07/03/2020 14.1  13.2 - 17.1 g/dL Final  . HCT 07/03/2020 43.3  38.5 - 50.0 % Final  . MCV 07/03/2020 94.3  80.0 - 100.0 fL Final  . MCH 07/03/2020 30.7  27.0 - 33.0 pg Final  . MCHC 07/03/2020 32.6  32.0 - 36.0 g/dL Final  . RDW 07/03/2020 12.2  11.0 - 15.0 % Final  . Platelets 07/03/2020 153  140 - 400 Thousand/uL Final  . MPV 07/03/2020 10.7  7.5 - 12.5 fL Final  . Neutro Abs 07/03/2020 3,330  1,500 - 7,800 cells/uL Final  . Lymphs Abs 07/03/2020 2,748  850 - 3,900 cells/uL Final  . Absolute Monocytes 07/03/2020 767  200 - 950 cells/uL Final  . Eosinophils Absolute 07/03/2020 227  15 - 500 cells/uL Final  . Basophils Absolute 07/03/2020 28  0 - 200 cells/uL Final  . Neutrophils Relative % 07/03/2020 46.9  % Final  . Total Lymphocyte 07/03/2020 38.7  % Final  . Monocytes Relative 07/03/2020 10.8  % Final  . Eosinophils Relative 07/03/2020 3.2  % Final  . Basophils Relative 07/03/2020 0.4  % Final    Past Medical History:  Diagnosis Date  . Atrial flutter ([place])    a. s/p CTI ablation x2 by Dr Rayann Heman 10/12, 9/13  . Basal cell carcinoma of cheek   . BPH (benign prostatic hyperplasia)   . CAD (coronary artery disease)    nonobstructive on cath 08/2018  . CHF (congestive heart failure) ([place])    ef 35% on Echo 2020  . CKD (chronic kidney disease) stage 3, GFR 30-59 ml/min (HCC)   . Colon polyps   . Diverticulosis   . Ectopic atrial tachycardia ([place])    a. Dx 07/2015: rate controlled   . Hemorrhoids   . Hyperlipidemia   . Hypertension   . Hypertrophic cardiomyopathy ([place])   . TIA (transient ischemic attack)    Past Surgical History:  Procedure Laterality Date  . ATRIAL ABLATION SURGERY  10/12   CTI ablation for atrial flutter by Dr Rayann Heman  . ATRIAL  FLUTTER ABLATION N/A 12/11/2011   CTI ablation for atrial flutter by Dr Rayann Heman  . BASAL CELL CARCINOMA EXCISION  ~ 1973   left cheek  . CARDIAC CATHETERIZATION  1960's  . HERNIA REPAIR  ~ [id number]   umbilical  . JOINT REPLACEMENT    . RIGHT/LEFT HEART CATH AND CORONARY ANGIOGRAPHY N/A 08/26/2018   Procedure: RIGHT/LEFT HEART CATH  AND CORONARY ANGIOGRAPHY;  Surgeon: Sherren Mocha, MD;  Location: [facility];  Service: Cardiovascular;  Laterality: N/A;  . TONSILLECTOMY AND ADENOIDECTOMY  1947  . TOTAL HIP ARTHROPLASTY     left   Current Outpatient Medications on File Prior to Visit  Medication Sig Dispense Refill  . atorvastatin (LIPITOR) 40 MG tablet Take 1 tablet (40 mg total) by mouth daily. 90 tablet 0  . clindamycin (CLEOCIN) 300 MG capsule Take 2 capsules 1hour prior to dental procedure. 6 capsule 0  . Coenzyme Q10 (COQ10) 200 MG CAPS Take 200 mg by mouth every evening.    Marland Kitchen ELIQUIS 5 MG TABS tablet TAKE 1 TABLET BY MOUTH TWICE A DAY 180 tablet 1  . ezetimibe (ZETIA) 10 MG tablet Take 1 tablet (10 mg total) by mouth daily. 90 tablet 3  . Melatonin 5 MG TABS Take 5 mg by mouth at bedtime.    . metoprolol succinate (TOPROL XL) 25 MG 24 hr tablet Take 0.5 tablets (12.5 mg total) by mouth daily. 90 tablet 1  . sacubitril-valsartan (ENTRESTO) 49-51 MG Take 1 tablet by mouth 2 (two) times daily. 180 tablet 3   Current Facility-Administered Medications on File Prior to Visit  Medication Dose Route Frequency Provider Last Rate Last Admin  . 0.9 %  sodium chloride infusion  500 mL Intravenous Continuous Pyrtle, Lajuan Lines, MD       Allergies  Allergen Reactions  . Aldactone [Spironolactone]     Hyperkalemia  . Nsaids Other (See Comments)    GI  Upset   . Crestor [Rosuvastatin Calcium] Other (See Comments)    Joint pain  . Penicillins Other (See Comments)    Unknown reaction Did it involve swelling of the face/tongue/throat, SOB, or low BP? Unknown Did it involve sudden or severe  rash/hives, skin peeling, or any reaction on the inside of your mouth or nose? Unknown Did you need to seek medical attention at a hospital or doctor's office? Unknown When did it last happen? More than 50 years ago If all above answers are "NO", may proceed with cephalosporin use.    Social History   Socioeconomic History  . Marital status: Married    Spouse name: Not on file  . Number of children: 2  . Years of education: Not on file  . Highest education level: Not on file  Occupational History  . Occupation: retired  Tobacco Use  . Smoking status: Former Smoker    Packs/day: 2.00    Years: 10.00    Pack years: 20.00    Types: Cigarettes    Quit date: 03/17/1970    Years since quitting: 50.3  . Smokeless tobacco: Never Used  Vaping Use  . Vaping Use: Never used  Substance and Sexual Activity  . Alcohol use: No    Comment: quit 1995  . Drug use: No  . Sexual activity: Yes  Other Topics Concern  . Not on file  Social History Narrative   Lives in [place] with spouse.  2 Grown children.  Retired from Data processing manager   Social Determinants of Radio broadcast assistant Strain: [place]   . Difficulty of Paying Living Expenses: Not hard at all  Food Insecurity: Not on file  Transportation Needs: Not on file  Physical Activity: Not on file  Stress: Not on file  Social Connections: Not on file  Intimate Partner Violence: Not on file   Family History  Problem Relation Age of Onset  .  Breast cancer Mother   . Ulcers Father        stomach  . COPD Father   . Tuberculosis Father   . Colon polyps Brother   . Tuberculosis Brother   . Brain cancer Brother   . Colon cancer Neg Hx   . Stomach cancer Neg Hx   . Rectal cancer Neg Hx   . Esophageal cancer Neg Hx   . Liver cancer Neg Hx       Review of Systems  All other systems reviewed and are negative.      Objective:   Physical Exam Vitals reviewed.  Constitutional:      General: He is not in  acute distress.    Appearance: He is well-developed. He is not diaphoretic.  HENT:     Head: Normocephalic and atraumatic.     Right Ear: External ear normal.     Left Ear: External ear normal.     Nose: Nose normal.     Mouth/Throat:     Pharynx: No oropharyngeal exudate.  Eyes:     General: No scleral icterus.       Right eye: No discharge.        Left eye: No discharge.     Conjunctiva/sclera: Conjunctivae normal.     Pupils: Pupils are equal, round, and reactive to light.  Neck:     Thyroid: No thyromegaly.     Vascular: No JVD.     Trachea: No tracheal deviation.  Cardiovascular:     Rate and Rhythm: Normal rate and regular rhythm.     Heart sounds: Normal heart sounds. No murmur heard. No friction rub. No gallop.   Pulmonary:     Effort: Pulmonary effort is normal. No respiratory distress.     Breath sounds: Normal breath sounds. No stridor. No wheezing or rales.  Chest:     Chest wall: No tenderness.  Abdominal:     General: Bowel sounds are normal. There is no distension.     Palpations: Abdomen is soft. There is no mass.     Tenderness: There is no abdominal tenderness. There is no guarding or rebound.  Musculoskeletal:        General: No tenderness. Normal range of motion.     Cervical back: Normal range of motion and neck supple.  Lymphadenopathy:     Cervical: No cervical adenopathy.  Skin:    General: Skin is warm.     Coloration: Skin is not pale.     Findings: No erythema or rash.  Neurological:     Mental Status: He is alert and oriented to person, place, and time.     Cranial Nerves: No cranial nerve deficit.     Motor: No abnormal muscle tone.     Coordination: Coordination normal.     Deep Tendon Reflexes: Reflexes are normal and symmetric.  Psychiatric:        Behavior: Behavior normal.        Thought Content: Thought content normal.        Judgment: Judgment normal.      Lab on 07/03/2020  Component Date Value Ref Range Status  .  Cholesterol 07/03/2020 131  <200 mg/dL Final  . HDL 07/03/2020 57  > OR = 40 mg/dL Final  . Triglycerides 07/03/2020 66  <150 mg/dL Final  . LDL Cholesterol (Calc) 07/03/2020 59  mg/dL (calc) Final   Comment: Reference range: <100 . Desirable range <100 mg/dL for primary prevention;   <70 mg/dL  for patients with CHD or diabetic patients  with > or = 2 CHD risk factors. Marland Kitchen LDL-C is now calculated using the Martin-Hopkins  calculation, which is a validated novel method providing  better accuracy than the Friedewald equation in the  estimation of LDL-C.  [name] et al. Annamaria Helling. 2841;324(40): 2061-2068  (http://education.[facility].com/faq/FAQ164)   . Total CHOL/HDL Ratio 07/03/2020 2.3  <5.0 (calc) Final  . Non-HDL Cholesterol (Calc) 07/03/2020 74  <130 mg/dL (calc) Final   Comment: For patients with diabetes plus 1 major ASCVD risk  factor, treating to a non-HDL-C goal of <100 mg/dL  (LDL-C of <70 mg/dL) is considered a therapeutic  option.   . Glucose, Bld 07/03/2020 95  65 - 99 mg/dL Final   Comment: .            Fasting reference interval .   . BUN 07/03/2020 27* 7 - 25 mg/dL Final  . Creat 07/03/2020 1.19* 0.70 - 1.11 mg/dL Final   Comment: For patients >52 years of age, the reference limit for Creatinine is approximately 13% higher for people identified as African-American. .   . GFR, Est Non African American 07/03/2020 57* > OR = 60 mL/min/1.34m2 Final  . GFR, Est African American 07/03/2020 66  > OR = 60 mL/min/1.41m2 Final  . BUN/Creatinine Ratio 07/03/2020 23* 6 - 22 (calc) Final  . Sodium 07/03/2020 141  135 - 146 mmol/L Final  . Potassium 07/03/2020 4.7  3.5 - 5.3 mmol/L Final  . Chloride 07/03/2020 107  98 - 110 mmol/L Final  . CO2 07/03/2020 27  20 - 32 mmol/L Final  . Calcium 07/03/2020 9.1  8.6 - 10.3 mg/dL Final  . Total Protein 07/03/2020 5.8* 6.1 - 8.1 g/dL Final  . Albumin 07/03/2020 3.9  3.6 - 5.1 g/dL Final  . Globulin 07/03/2020 1.9  1.9 - 3.7  g/dL (calc) Final  . AG Ratio 07/03/2020 2.1  1.0 - 2.5 (calc) Final  . Total Bilirubin 07/03/2020 0.6  0.2 - 1.2 mg/dL Final  . Alkaline phosphatase (APISO) 07/03/2020 25* 35 - 144 U/L Final  . AST 07/03/2020 20  10 - 35 U/L Final  . ALT 07/03/2020 19  9 - 46 U/L Final  . PSA 07/03/2020 2.52  < OR = 4.0 ng/mL Final   Comment: The total PSA value from this assay system is  standardized against the WHO standard. The test  result will be approximately 20% lower when compared  to the equimolar-standardized total PSA (Beckman  Coulter). Comparison of serial PSA results should be  interpreted with this fact in mind. . This test was performed using the Siemens  chemiluminescent method. Values obtained from  different assay methods cannot be used interchangeably. PSA levels, regardless of value, should not be interpreted as absolute evidence of the presence or absence of disease.   Marland Kitchen TSH 07/03/2020 3.26  0.40 - 4.50 mIU/L Final  . WBC 07/03/2020 7.1  3.8 - 10.8 Thousand/uL Final  . RBC 07/03/2020 4.59  4.20 - 5.80 Million/uL Final  . Hemoglobin 07/03/2020 14.1  13.2 - 17.1 g/dL Final  . HCT 07/03/2020 43.3  38.5 - 50.0 % Final  . MCV 07/03/2020 94.3  80.0 - 100.0 fL Final  . MCH 07/03/2020 30.7  27.0 - 33.0 pg Final  . MCHC 07/03/2020 32.6  32.0 - 36.0 g/dL Final  . RDW 07/03/2020 12.2  11.0 - 15.0 % Final  . Platelets 07/03/2020 153  140 - 400 Thousand/uL Final  . MPV 07/03/2020  10.7  7.5 - 12.5 fL Final  . Neutro Abs 07/03/2020 3,330  1,500 - 7,800 cells/uL Final  . Lymphs Abs 07/03/2020 2,748  850 - 3,900 cells/uL Final  . Absolute Monocytes 07/03/2020 767  200 - 950 cells/uL Final  . Eosinophils Absolute 07/03/2020 227  15 - 500 cells/uL Final  . Basophils Absolute 07/03/2020 28  0 - 200 cells/uL Final  . Neutrophils Relative % 07/03/2020 46.9  % Final  . Total Lymphocyte 07/03/2020 38.7  % Final  . Monocytes Relative 07/03/2020 10.8  % Final  . Eosinophils Relative 07/03/2020  3.2  % Final  . Basophils Relative 07/03/2020 0.4  % Final        Assessment & Plan:  Coronary artery disease involving native coronary artery of native heart without angina pectoris  Hx-TIA (transient ischemic attack)  Benign essential HTN  Pure hypercholesterolemia  Atrial flutter, unspecified type (HCC)  HFrEF (heart failure with reduced ejection fraction) ([place])  I am very happy with the patient's lab work.  His cholesterol is outstanding and well below his goal of 70 given his history of coronary artery disease and TIA.  His blood pressure today is excellent at 128/74.  We discussed retrying spironolactone versus trying Jardiance.  The patient would like to consider the Jardiance and let me know his decision.  If he elects to do so, I would start him on 25 mg a day and then defer repeating an echocardiogram to his cardiologist at their follow-up in 1 year.  The remainder of his lab work is outstanding

## 2020-07-12 ENCOUNTER — Other Ambulatory Visit: Payer: Self-pay | Admitting: Family Medicine

## 2020-07-12 MED ORDER — EMPAGLIFLOZIN 25 MG PO TABS: 25.0000 mg | ORAL_TABLET | Freq: Every day | ORAL | 3 refills | Status: DC

## 2020-07-19 DIAGNOSIS — L814 Other melanin hyperpigmentation: Secondary | ICD-10-CM | POA: Diagnosis not present

## 2020-07-19 DIAGNOSIS — L570 Actinic keratosis: Secondary | ICD-10-CM | POA: Diagnosis not present

## 2020-07-19 DIAGNOSIS — L821 Other seborrheic keratosis: Secondary | ICD-10-CM | POA: Diagnosis not present

## 2020-07-19 DIAGNOSIS — Z85820 Personal history of malignant melanoma of skin: Secondary | ICD-10-CM | POA: Diagnosis not present

## 2020-07-19 DIAGNOSIS — L819 Disorder of pigmentation, unspecified: Secondary | ICD-10-CM | POA: Diagnosis not present

## 2020-07-19 DIAGNOSIS — D1801 Hemangioma of skin and subcutaneous tissue: Secondary | ICD-10-CM | POA: Diagnosis not present

## 2020-07-19 DIAGNOSIS — L905 Scar conditions and fibrosis of skin: Secondary | ICD-10-CM | POA: Diagnosis not present

## 2020-07-23 ENCOUNTER — Telehealth: Payer: Self-pay | Admitting: Pharmacist

## 2020-07-23 NOTE — Progress Notes (Addendum)
    Chronic Care Management Pharmacy Assistant   Name: [name]  MRN: [id number] DOB: May 30, 1940  Reason for Encounter: General Disease State Call   Conditions to be addressed/monitored: hypertension, CHF, CAD, hyperlipidemia, history of TIA.  Recent office visits:  07/10/20 Dr. Dennard Schaumann For Follow-Up. No medication changes.  Recent consult visits:  04/26/20 Werner Lean, MD. For Chronic Heart Failure. RESTART Metoprolol succinate (Toprol XL) 12.5mg  daily 04/19/20 Optometry [place], [place] [facility] visits:  None since 04/06/20  Medications: Outpatient Encounter Medications as of 07/23/2020  Medication Sig   atorvastatin (LIPITOR) 40 MG tablet Take 1 tablet (40 mg total) by mouth daily.   clindamycin (CLEOCIN) 300 MG capsule Take 2 capsules 1hour prior to dental procedure. (Patient not taking: Reported on 07/10/2020)   Coenzyme Q10 (COQ10) 200 MG CAPS Take 200 mg by mouth every evening.   ELIQUIS 5 MG TABS tablet TAKE 1 TABLET BY MOUTH TWICE A DAY   empagliflozin (JARDIANCE) 25 MG TABS tablet Take 1 tablet (25 mg total) by mouth daily before breakfast.   ezetimibe (ZETIA) 10 MG tablet Take 1 tablet (10 mg total) by mouth daily.   Melatonin 5 MG TABS Take 5 mg by mouth at bedtime.   metoprolol succinate (TOPROL XL) 25 MG 24 hr tablet Take 0.5 tablets (12.5 mg total) by mouth daily.   sacubitril-valsartan (ENTRESTO) 49-51 MG Take 1 tablet by mouth 2 (two) times daily.   No facility-administered encounter medications on file as of 07/23/2020.   GEN CALL:  Star Rating Drugs: Atorvastatin 40 mg 90 DS 06/28/20, Empalifozin 25 mg 90 DS 07/12/20  Third unsuccessful telephone outreach was attempted today. The patient was referred to the pharmacist for assistance with care management and care coordination.    Follow-Up:Pharmacist Review  Charlann Lange, RMA Clinical Pharmacist Assistant [phone]  5 minutes spent in review, coordination, and documentation.  Reviewed  by: Beverly Milch, PharmD Clinical Pharmacist [facility] [phone]

## 2020-08-11 ENCOUNTER — Encounter: Payer: Self-pay | Admitting: Family Medicine

## 2020-08-11 DIAGNOSIS — U071 COVID-19: Secondary | ICD-10-CM | POA: Diagnosis not present

## 2020-08-14 ENCOUNTER — Encounter: Payer: Self-pay | Admitting: Family Medicine

## 2020-08-14 MED ORDER — NIRMATRELVIR/RITONAVIR (PAXLOVID) TABLET (RENAL DOSING): 2.0000 | ORAL_TABLET | Freq: Two times a day (BID) | ORAL | 0 refills | Status: AC

## 2020-08-14 NOTE — Telephone Encounter (Signed)
Definitely 

## 2020-08-22 ENCOUNTER — Telehealth: Payer: Self-pay | Admitting: Family Medicine

## 2020-08-22 NOTE — Telephone Encounter (Signed)
Pt wife called in stating that pt needs a refill of  atorvastatin (LIPITOR) 40 MG tablet  Sent to pharmacy Cb#:[phone]

## 2020-08-23 ENCOUNTER — Other Ambulatory Visit: Payer: Self-pay | Admitting: *Deleted

## 2020-08-23 ENCOUNTER — Other Ambulatory Visit: Payer: Self-pay

## 2020-08-23 MED ORDER — ATORVASTATIN CALCIUM 40 MG PO TABS: 1.0000 | ORAL_TABLET | Freq: Every day | ORAL | 3 refills | Status: DC

## 2020-08-23 MED ORDER — EZETIMIBE 10 MG PO TABS: 10.0000 mg | ORAL_TABLET | Freq: Every day | ORAL | 3 refills | Status: DC

## 2020-08-28 ENCOUNTER — Telehealth: Payer: Self-pay | Admitting: Family Medicine

## 2020-08-28 NOTE — Telephone Encounter (Signed)
Left message for patient to call back and schedule Medicare Annual Wellness Visit (AWV) in office.   If not able to come in office, please offer to do virtually or by telephone.   Last AWV: 01/20/2019  Please schedule at anytime with [facility] Health Advisor.  If any questions, please contact me at [phone]

## 2020-08-29 ENCOUNTER — Other Ambulatory Visit: Payer: Self-pay | Admitting: *Deleted

## 2020-08-29 MED ORDER — ATORVASTATIN CALCIUM 40 MG PO TABS: 1.0000 | ORAL_TABLET | Freq: Every day | ORAL | 3 refills | Status: DC

## 2020-09-06 ENCOUNTER — Telehealth: Payer: Self-pay | Admitting: Family Medicine

## 2020-09-06 ENCOUNTER — Ambulatory Visit: Payer: PPO

## 2020-09-06 NOTE — Telephone Encounter (Signed)
Left message for patient to call back and schedule Medicare Annual Wellness Visit (AWV) in office.   If not able to come in office, please offer to do virtually or by telephone.   Last AWV:01/20/2019  Please schedule at anytime with [facility] Health Advisor.  If any questions, please contact me at [phone]

## 2020-09-24 ENCOUNTER — Other Ambulatory Visit: Payer: Self-pay | Admitting: Internal Medicine

## 2020-09-25 NOTE — Telephone Encounter (Signed)
Pt last saw Dr Gasper Sells 04/26/20, last labs 07/03/20 Creat 1.19, age 80, weight 80.3kg, based on specified criteria pt is on appropriate dosage of Eliquis 5mg  BID for aflutter.  Will refill rx.

## 2020-10-24 DIAGNOSIS — H401231 Low-tension glaucoma, bilateral, mild stage: Secondary | ICD-10-CM | POA: Diagnosis not present

## 2020-10-28 NOTE — Progress Notes (Signed)
Cardiology Office Note:    Date:  10/29/2020   ID:  [name], DOB [birth year], MRN [id number]  PCP:  Anthony Frizzle, MD  [facility] Cardiologist: Anthony Haskell MD [place] Electrophysiologist:  Anthony Grayer, MD   CC:  Follow up HFrEF  History of Present Illness:    [name] is a 80 y.o. male with a hx of Atrial flutter s/p multiple CT ablations last in 2013 (6+), AT, Suspicion for Obstructive CAD with LCP in 08/2018 and scar on MRI, Hx of HTN with  HLD , prior TIA. CKD stage III who presented for evaluation for HCM eval 12/22/19.  In interim from this visit had Cardiac MRI for evaluation of etiology of HF:  Found to have disease consistent with prior LAD infarct and improvement of LVEF.  In interim from 11/9 saw Dr. Rayann [name] with no changes need and got paperwork done for [facility].  Last seen 04/26/2020.  Patient notes that he is doing about the same.  Since last visit notes still working outdoors and playing golf. Works at his part time job without issues.  There are no interval hospital/ED visit.    No chest pain or pressure.  No SOB/DOE and no PND/Orthopnea.  No weight gain or leg swelling.  No palpitations or syncope.  Ambulatory blood pressure 100/60.   Past Medical History:  Diagnosis Date   Atrial flutter ([place])    a. s/p CTI ablation x2 by Dr [name] 10/12, 9/13   Basal cell carcinoma of cheek    BPH (benign prostatic hyperplasia)    CAD (coronary artery disease)    nonobstructive on cath 08/2018   CHF (congestive heart failure) ([place])    ef 35% on Echo 2020   CKD (chronic kidney disease) stage 3, GFR 30-59 ml/min (HCC)    Colon polyps    Diverticulosis    Ectopic atrial tachycardia ([place])    a. Dx 07/2015: rate controlled    Hemorrhoids    Hyperlipidemia    Hypertension    Hypertrophic cardiomyopathy (HCC)    TIA (transient ischemic attack)     Past Surgical History:  Procedure Laterality Date   ATRIAL ABLATION SURGERY  10/12   CTI  ablation for atrial flutter by Dr [name]   ATRIAL FLUTTER ABLATION N/A 12/11/2011   CTI ablation for atrial flutter by Dr [name]   BASAL CELL CARCINOMA EXCISION  ~ 1973   left cheek   CARDIAC CATHETERIZATION  1960's   HERNIA REPAIR  ~ [id number]   umbilical   JOINT REPLACEMENT     RIGHT/LEFT HEART CATH AND CORONARY ANGIOGRAPHY N/A 08/26/2018   Procedure: RIGHT/LEFT HEART CATH AND CORONARY ANGIOGRAPHY;  Surgeon: Anthony Mocha, MD;  Location: [facility];  Service: Cardiovascular;  Laterality: N/A;   TONSILLECTOMY AND ADENOIDECTOMY  1947   TOTAL HIP ARTHROPLASTY     left   Current Medications: Current Meds  Medication Sig   atorvastatin (LIPITOR) 40 MG tablet Take 1 tablet (40 mg total) by mouth daily.   benzonatate (TESSALON) 100 MG capsule Take 100 mg by mouth as needed.   Coenzyme Q10 (COQ10) 200 MG CAPS Take 200 mg by mouth every evening.   ELIQUIS 5 MG TABS tablet TAKE 1 TABLET BY MOUTH TWICE A DAY   empagliflozin (JARDIANCE) 25 MG TABS tablet Take 1 tablet (25 mg total) by mouth daily before breakfast.   ezetimibe (ZETIA) 10 MG tablet Take 1 tablet (10 mg total) by mouth daily.   Melatonin  5 MG TABS Take 5 mg by mouth at bedtime.   metoprolol succinate (TOPROL XL) 25 MG 24 hr tablet Take 0.5 tablets (12.5 mg total) by mouth daily.   sacubitril-valsartan (ENTRESTO) 49-51 MG Take 1 tablet by mouth 2 (two) times daily.    Allergies:   Aldactone [spironolactone], Nsaids, Crestor [rosuvastatin calcium], and Penicillins   Social History   Socioeconomic History   Marital status: Married    Spouse name: Not on file   Number of children: 2   Years of education: Not on file   Highest education level: Not on file  Occupational History   Occupation: retired  Tobacco Use   Smoking status: Former    Packs/day: 2.00    Years: 10.00    Pack years: 20.00    Types: Cigarettes    Quit date: 03/17/1970    Years since quitting: 50.6   Smokeless tobacco: Never  Vaping Use   Vaping Use:  Never used  Substance and Sexual Activity   Alcohol use: No    Comment: quit 1995   Drug use: No   Sexual activity: Yes  Other Topics Concern   Not on file  Social History Narrative   Lives in [place] with spouse.  2 Grown children.  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 There is no history of Colon cancer, Stomach cancer, Rectal cancer, Esophageal cancer, or Liver cancer.  ROS:   Please see the history of present illness.    All other systems reviewed and are negative.  EKGs/Labs/Other Studies Reviewed:    The following studies were reviewed today:  EKG: 10/29/20: Atrial Flutter 51 with slow ventricular response RBBB  Cardiac MRI: Date: 01/18/20 Results: Normal left ventricular size, thickness and systolic function is (LVEF =43%). There is anteroseptal and inferoseptal (base) hypokinesis with apical hypokinesis. There is no evidence of LVOT obstruction; supine study. There is late gadolinium enhancement in the left ventricular myocardium in the anteroseptal base, and in the anterior, anteroseptal, inferoseptal, and inferior mid segments. There is apical, transmural late gadolinium enhancement.  Transthoracic Echocardiogram: Date: 11/08/19 Results: Echo Reduced EF with asymmetric thickening and without SAM.  1. Left ventricular ejection fraction, by estimation, is 30%. The left  ventricle has  severely decreased function. The left ventricle demonstrates  regional wall motion abnormalities with hypokinesis of the inferior wall,  the inferoseptal wall, the anteroseptal wall, and the apex. There is mild left ventricular hypertrophy. Left ventricular diastolic parameters are consistent with Grade I diastolic dysfunction (impaired relaxation).   2. Right ventricular systolic function is normal. The right ventricular  size is normal. Tricuspid regurgitation signal is inadequate for assessing PA pressure.   3. Left atrial size was mildly dilated.   4. Right atrial size was mildly dilated.   5. The mitral valve is normal in structure. Trivial mitral valve  regurgitation. No evidence of mitral stenosis.  6. The aortic valve is tricuspid. Aortic valve regurgitation is not  visualized. No aortic stenosis is present.   7. Aortic dilatation noted. There is mild dilatation of the ascending  aorta measuring 37 mm.   8. The inferior vena cava is normal in size with greater than 50%  respiratory variability, suggesting right atrial pressure of 3 mmHg.   Nuclear Medicine Stress Test:  05/21/18:   No exercise associated VT.  Large anterior perfusion defect.  Normal exercise response  NM Stress Testing : Date: 11/24/19 Results: 12/14/19 Conclusion  Visual quantitative assessment is Grade 2 (suggestive of amyloid), however -- H/CL ratio is 1.3  3) Equivocal for TTR amyloidosis (Equivocal: A semi-quantitative visual score of 1 or H/CL ratio 1-1.5)**  Consider cMRI to further evaluate for TTR amyloid  Left/Right Heart Catheterizations: Date: 08/26/2018 Results: 1.  Nonobstructive coronary artery disease with patency of the left main stem, patency of the LAD with minor nonobstructive disease, patency of the circumflex/intermediate, severe stenosis at the ostium of the small distal diagonal branch, and mild nonobstructive disease of a large, dominant RCA 2.  Normal intracardiac hemodynamics in both the  right and left heart with normal/low intracardiac filling pressures suggestive of well compensated chronic systolic heart failure  Recent Labs: 12/22/2019: NT-Pro BNP 1,101 07/03/2020: ALT 19; BUN 27; Creat 1.19; Hemoglobin 14.1; Platelets 153; Potassium 4.7; Sodium 141; TSH 3.26  Recent Lipid Panel    Component Value Date/Time   CHOL 131 07/03/2020 0805   CHOL 138 11/15/2019 0922   TRIG 66 07/03/2020 0805   HDL 57 07/03/2020 0805   HDL 63 11/15/2019 0922   CHOLHDL 2.3 07/03/2020 0805   VLDL 19 11/03/2016 0841   LDLCALC 59 07/03/2020 0805    Physical Exam:    VS:  BP 102/68   Pulse (!) 51   Ht '5\' 10"'$  (1.778 m)   Wt 166 lb (75.3 kg)   SpO2 98%   BMI 23.82 kg/m     Wt Readings from Last 3 Encounters:  10/29/20 166 lb (75.3 kg)  07/10/20 177 lb (80.3 kg)  04/26/20 177 lb 9.6 oz (80.6 kg)   GEN: Well nourished, well developed in no acute distress HEENT:  NECK: No JVD LYMPHATICS: No lymphadenopathy CARDIAC: RRR, no murmurs, rubs, gallops no change with handgrip RESPIRATORY:  Clear to auscultation without rales, wheezing or rhonchi  ABDOMEN: Soft, non-tender, non-distended MUSCULOSKELETAL:  No edema; No deformity  SKIN: Warm and dry NEUROLOGIC:  Alert and oriented x 3 PSYCHIATRIC:  Normal affect   ASSESSMENT:    1. Atypical atrial flutter (HCC)   2. HFrEF (heart failure with reduced ejection fraction) ([place])   3. Coronary artery disease involving native coronary artery of native heart without angina pectoris     PLAN:    In order of problems listed above:  Atrial Flutter and AT - CHADSVASC 6 on DOAC - back in Flutter- will get echocardiogram  Systolic  Heart Failure with Reduced Ejection Fraction HTN CKD Stage III - NYHA class II, Stage B, euvolemic, etiology from septal MI - presently, no ICD or CRT indication - Euvolemic- will not add lasix at this time - On metoprolol succinate 12.5 mg PO Daily with bradycardia (well tolerated) - ARNI 49-51 max dose  tolerated bas on amb BP log - Hyperkalemia with aldactone - SGLT2i 25 mg PO Daily ([place]) - on AC no ASA - Last EF above 35%; echo as above  Coronary Artery Disease; Obstructive septal perforator - asymptomatic - continue  DOAC - continue statin and zetia, goal LDL < 70 (59 on 07/03/2020) - continue BB  Prior diagnosis of borderline TAA without rupture MRI- AA 33 mm.  Conservative monitoring at this time   5-6 month follow up unless new symptoms or abnormal test results warranting change in plan  Would be reasonable for  APP Follow up  Medication Adjustments/Labs and Tests Ordered: Current medicines are reviewed at length with the patient today.  Concerns regarding medicines are outlined above.  No orders of the defined types were placed in this encounter.  No orders of the defined types were placed in this encounter.   Patient Instructions  Medication Instructions:  Your physician recommends that you continue on your current medications as directed. Please refer to the Current Medication list given to you today.  *If you need a refill on your cardiac medications before your next appointment, please call your pharmacy*   Lab Work: NONE If you have labs (blood work) drawn today and your tests are completely normal, you will receive your results only by: [facility] (if you have MyChart) OR A paper copy in the mail If you have any lab test that is abnormal or we need to change your treatment, we will call you to review the results.   Testing/Procedures: Your physician has requested that you have an echocardiogram. Echocardiography is a painless test that uses sound waves to create images of your heart. It provides your doctor with information about the size and shape of your heart and how well your heart's chambers and valves are working. This procedure takes approximately one hour. There are no restrictions for this procedure.    Follow-Up: At [facility], you  and your health needs are our priority.  As part of our continuing mission to provide you with exceptional heart care, we have created designated Provider Care Teams.  These Care Teams include your primary Cardiologist (physician) and Advanced Practice Providers (APPs -  Physician Assistants and Nurse Practitioners) who all work together to provide you with the care you need, when you need it.  Your next appointment:   5-6 month(s)  The format for your next appointment:   In Person  Provider:   You may see Anthony Haskell, MD  or one of the following Advanced Practice Providers on your designated Care Team:   Melina Copa, PA-C Ermalinda Barrios, PA-C        Signed, Werner Lean, MD  10/29/2020 8:39 AM    [place]

## 2020-10-28 NOTE — H&P (View-Only) (Signed)
Cardiology Office Note:    Date:  10/29/2020   ID:  [name], DOB May 25, 1940, MRN [id number]  PCP:  Susy Frizzle, MD  [facility] Cardiologist: Rudean Haskell MD [place] Electrophysiologist:  Thompson Grayer, MD   CC:  Follow up HFrEF  History of Present Illness:    [name] is a 80 y.o. male with a hx of Atrial flutter s/p multiple CT ablations last in 2013 (6+), AT, Suspicion for Obstructive CAD with LCP in 08/2018 and scar on MRI, Hx of HTN with  HLD , prior TIA. CKD stage III who presented for evaluation for HCM eval 12/22/19.  In interim from this visit had Cardiac MRI for evaluation of etiology of HF:  Found to have disease consistent with prior LAD infarct and improvement of LVEF.  In interim from 11/9 saw Dr. Rayann Heman with no changes need and got paperwork done for [facility].  Last seen 04/26/2020.  Patient notes that he is doing about the same.  Since last visit notes still working outdoors and playing golf. Works at his part time job without issues.  There are no interval hospital/ED visit.    No chest pain or pressure.  No SOB/DOE and no PND/Orthopnea.  No weight gain or leg swelling.  No palpitations or syncope.  Ambulatory blood pressure 100/60.   Past Medical History:  Diagnosis Date   Atrial flutter ([place])    a. s/p CTI ablation x2 by Dr Rayann Heman 10/12, 9/13   Basal cell carcinoma of cheek    BPH (benign prostatic hyperplasia)    CAD (coronary artery disease)    nonobstructive on cath 08/2018   CHF (congestive heart failure) ([place])    ef 35% on Echo 2020   CKD (chronic kidney disease) stage 3, GFR 30-59 ml/min (HCC)    Colon polyps    Diverticulosis    Ectopic atrial tachycardia ([place])    a. Dx 07/2015: rate controlled    Hemorrhoids    Hyperlipidemia    Hypertension    Hypertrophic cardiomyopathy (HCC)    TIA (transient ischemic attack)     Past Surgical History:  Procedure Laterality Date   ATRIAL ABLATION SURGERY  10/12   CTI  ablation for atrial flutter by Dr Rayann Heman   ATRIAL FLUTTER ABLATION N/A 12/11/2011   CTI ablation for atrial flutter by Dr Rayann Heman   BASAL CELL CARCINOMA EXCISION  ~ 1973   left cheek   CARDIAC CATHETERIZATION  1960's   HERNIA REPAIR  ~ [id number]   umbilical   JOINT REPLACEMENT     RIGHT/LEFT HEART CATH AND CORONARY ANGIOGRAPHY N/A 08/26/2018   Procedure: RIGHT/LEFT HEART CATH AND CORONARY ANGIOGRAPHY;  Surgeon: Sherren Mocha, MD;  Location: [place] [facility];  Service: Cardiovascular;  Laterality: N/A;   TONSILLECTOMY AND ADENOIDECTOMY  1947   TOTAL HIP ARTHROPLASTY     left   Current Medications: Current Meds  Medication Sig   atorvastatin (LIPITOR) 40 MG tablet Take 1 tablet (40 mg total) by mouth daily.   benzonatate (TESSALON) 100 MG capsule Take 100 mg by mouth as needed.   Coenzyme Q10 (COQ10) 200 MG CAPS Take 200 mg by mouth every evening.   ELIQUIS 5 MG TABS tablet TAKE 1 TABLET BY MOUTH TWICE A DAY   empagliflozin (JARDIANCE) 25 MG TABS tablet Take 1 tablet (25 mg total) by mouth daily before breakfast.   ezetimibe (ZETIA) 10 MG tablet Take 1 tablet (10 mg total) by mouth daily.   Melatonin  5 MG TABS Take 5 mg by mouth at bedtime.   metoprolol succinate (TOPROL XL) 25 MG 24 hr tablet Take 0.5 tablets (12.5 mg total) by mouth daily.   sacubitril-valsartan (ENTRESTO) 49-51 MG Take 1 tablet by mouth 2 (two) times daily.    Allergies:   Aldactone [spironolactone], Nsaids, Crestor [rosuvastatin calcium], and Penicillins   Social History   Socioeconomic History   Marital status: Married    Spouse name: Not on file   Number of children: 2   Years of education: Not on file   Highest education level: Not on file  Occupational History   Occupation: retired  Tobacco Use   Smoking status: Former    Packs/day: 2.00    Years: 10.00    Pack years: 20.00    Types: Cigarettes    Quit date: 03/17/1970    Years since quitting: 50.6   Smokeless tobacco: Never  Vaping Use   Vaping Use:  Never used  Substance and Sexual Activity   Alcohol use: No    Comment: quit 1995   Drug use: No   Sexual activity: Yes  Other Topics Concern   Not on file  Social History Narrative   Lives in [place] with spouse.  2 Grown children.  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 There is no history of Colon cancer, Stomach cancer, Rectal cancer, Esophageal cancer, or Liver cancer.  ROS:   Please see the history of present illness.    All other systems reviewed and are negative.  EKGs/Labs/Other Studies Reviewed:    The following studies were reviewed today:  EKG: 10/29/20: Atrial Flutter 51 with slow ventricular response RBBB  Cardiac MRI: Date: 01/18/20 Results: Normal left ventricular size, thickness and systolic function is (LVEF =43%). There is anteroseptal and inferoseptal (base) hypokinesis with apical hypokinesis. There is no evidence of LVOT obstruction; supine study. There is late gadolinium enhancement in the left ventricular myocardium in the anteroseptal base, and in the anterior, anteroseptal, inferoseptal, and inferior mid segments. There is apical, transmural late gadolinium enhancement.  Transthoracic Echocardiogram: Date: 11/08/19 Results: Echo Reduced EF with asymmetric thickening and without SAM.  1. Left ventricular ejection fraction, by estimation, is 30%. The left  ventricle has  severely decreased function. The left ventricle demonstrates  regional wall motion abnormalities with hypokinesis of the inferior wall,  the inferoseptal wall, the anteroseptal wall, and the apex. There is mild left ventricular hypertrophy. Left ventricular diastolic parameters are consistent with Grade I diastolic dysfunction (impaired relaxation).   2. Right ventricular systolic function is normal. The right ventricular  size is normal. Tricuspid regurgitation signal is inadequate for assessing PA pressure.   3. Left atrial size was mildly dilated.   4. Right atrial size was mildly dilated.   5. The mitral valve is normal in structure. Trivial mitral valve  regurgitation. No evidence of mitral stenosis.  6. The aortic valve is tricuspid. Aortic valve regurgitation is not  visualized. No aortic stenosis is present.   7. Aortic dilatation noted. There is mild dilatation of the ascending  aorta measuring 37 mm.   8. The inferior vena cava is normal in size with greater than 50%  respiratory variability, suggesting right atrial pressure of 3 mmHg.   Nuclear Medicine Stress Test:  05/21/18:   No exercise associated VT.  Large anterior perfusion defect.  Normal exercise response  NM Stress Testing : Date: 11/24/19 Results: 12/14/19 Conclusion  Visual quantitative assessment is Grade 2 (suggestive of amyloid), however -- H/CL ratio is 1.3  3) Equivocal for TTR amyloidosis (Equivocal: A semi-quantitative visual score of 1 or H/CL ratio 1-1.5)**  Consider cMRI to further evaluate for TTR amyloid  Left/Right Heart Catheterizations: Date: 08/26/2018 Results: 1.  Nonobstructive coronary artery disease with patency of the left main stem, patency of the LAD with minor nonobstructive disease, patency of the circumflex/intermediate, severe stenosis at the ostium of the small distal diagonal branch, and mild nonobstructive disease of a large, dominant RCA 2.  Normal intracardiac hemodynamics in both the  right and left heart with normal/low intracardiac filling pressures suggestive of well compensated chronic systolic heart failure  Recent Labs: 12/22/2019: NT-Pro BNP 1,101 07/03/2020: ALT 19; BUN 27; Creat 1.19; Hemoglobin 14.1; Platelets 153; Potassium 4.7; Sodium 141; TSH 3.26  Recent Lipid Panel    Component Value Date/Time   CHOL 131 07/03/2020 0805   CHOL 138 11/15/2019 0922   TRIG 66 07/03/2020 0805   HDL 57 07/03/2020 0805   HDL 63 11/15/2019 0922   CHOLHDL 2.3 07/03/2020 0805   VLDL 19 11/03/2016 0841   LDLCALC 59 07/03/2020 0805    Physical Exam:    VS:  BP 102/68   Pulse (!) 51   Ht '5\' 10"'$  (1.778 m)   Wt 166 lb (75.3 kg)   SpO2 98%   BMI 23.82 kg/m     Wt Readings from Last 3 Encounters:  10/29/20 166 lb (75.3 kg)  07/10/20 177 lb (80.3 kg)  04/26/20 177 lb 9.6 oz (80.6 kg)   GEN: Well nourished, well developed in no acute distress HEENT:  NECK: No JVD LYMPHATICS: No lymphadenopathy CARDIAC: RRR, no murmurs, rubs, gallops no change with handgrip RESPIRATORY:  Clear to auscultation without rales, wheezing or rhonchi  ABDOMEN: Soft, non-tender, non-distended MUSCULOSKELETAL:  No edema; No deformity  SKIN: Warm and dry NEUROLOGIC:  Alert and oriented x 3 PSYCHIATRIC:  Normal affect   ASSESSMENT:    1. Atypical atrial flutter (HCC)   2. HFrEF (heart failure with reduced ejection fraction) ([place])   3. Coronary artery disease involving native coronary artery of native heart without angina pectoris     PLAN:    In order of problems listed above:  Atrial Flutter and AT - CHADSVASC 6 on DOAC - back in Flutter- will get echocardiogram  Systolic  Heart Failure with Reduced Ejection Fraction HTN CKD Stage III - NYHA class II, Stage B, euvolemic, etiology from septal MI - presently, no ICD or CRT indication - Euvolemic- will not add lasix at this time - On metoprolol succinate 12.5 mg PO Daily with bradycardia (well tolerated) - ARNI 49-51 max dose  tolerated bas on amb BP log - Hyperkalemia with aldactone - SGLT2i 25 mg PO Daily (Jardiance) - on AC no ASA - Last EF above 35%; echo as above  Coronary Artery Disease; Obstructive septal perforator - asymptomatic - continue  DOAC - continue statin and zetia, goal LDL < 70 (59 on 07/03/2020) - continue BB  Prior diagnosis of borderline TAA without rupture MRI- AA 33 mm.  Conservative monitoring at this time   5-6 month follow up unless new symptoms or abnormal test results warranting change in plan  Would be reasonable for  APP Follow up  Medication Adjustments/Labs and Tests Ordered: Current medicines are reviewed at length with the patient today.  Concerns regarding medicines are outlined above.  No orders of the defined types were placed in this encounter.  No orders of the defined types were placed in this encounter.   Patient Instructions  Medication Instructions:  Your physician recommends that you continue on your current medications as directed. Please refer to the Current Medication list given to you today.  *If you need a refill on your cardiac medications before your next appointment, please call your pharmacy*   Lab Work: NONE If you have labs (blood work) drawn today and your tests are completely normal, you will receive your results only by: [place] (if you have MyChart) OR A paper copy in the mail If you have any lab test that is abnormal or we need to change your treatment, we will call you to review the results.   Testing/Procedures: Your physician has requested that you have an echocardiogram. Echocardiography is a painless test that uses sound waves to create images of your heart. It provides your doctor with information about the size and shape of your heart and how well your heart's chambers and valves are working. This procedure takes approximately one hour. There are no restrictions for this procedure.    Follow-Up: At [facility], you  and your health needs are our priority.  As part of our continuing mission to provide you with exceptional heart care, we have created designated Provider Care Teams.  These Care Teams include your primary Cardiologist (physician) and Advanced Practice Providers (APPs -  Physician Assistants and Nurse Practitioners) who all work together to provide you with the care you need, when you need it.  Your next appointment:   5-6 month(s)  The format for your next appointment:   In Person  Provider:   You may see Rudean Haskell, MD  or one of the following Advanced Practice Providers on your designated Care Team:   Melina Copa, PA-C Ermalinda Barrios, PA-C        Signed, Werner Lean, MD  10/29/2020 8:39 AM    [place]

## 2020-10-29 ENCOUNTER — Encounter: Payer: Self-pay | Admitting: Internal Medicine

## 2020-10-29 ENCOUNTER — Other Ambulatory Visit: Payer: Self-pay

## 2020-10-29 ENCOUNTER — Ambulatory Visit: Payer: PPO | Admitting: Internal Medicine

## 2020-10-29 VITALS — BP 102/68 | HR 51 | Ht 70.0 in | Wt 166.0 lb

## 2020-10-29 DIAGNOSIS — I5020 Unspecified systolic (congestive) heart failure: Secondary | ICD-10-CM

## 2020-10-29 DIAGNOSIS — I2510 Atherosclerotic heart disease of native coronary artery without angina pectoris: Secondary | ICD-10-CM

## 2020-10-29 DIAGNOSIS — I484 Atypical atrial flutter: Secondary | ICD-10-CM | POA: Diagnosis not present

## 2020-10-29 NOTE — Patient Instructions (Addendum)
Medication Instructions:  Your physician recommends that you continue on your current medications as directed. Please refer to the Current Medication list given to you today.  *If you need a refill on your cardiac medications before your next appointment, please call your pharmacy*  We have provided you with 2 weeks of Entresto 49/51 samples per your request  Lab Work: NONE If you have labs (blood work) drawn today and your tests are completely normal, you will receive your results only by: [place] (if you have MyChart) OR A paper copy in the mail If you have any lab test that is abnormal or we need to change your treatment, we will call you to review the results.   Testing/Procedures: Your physician has requested that you have an echocardiogram. Echocardiography is a painless test that uses sound waves to create images of your heart. It provides your doctor with information about the size and shape of your heart and how well your heart's chambers and valves are working. This procedure takes approximately one hour. There are no restrictions for this procedure.    Follow-Up: At [facility], you and your health needs are our priority.  As part of our continuing mission to provide you with exceptional heart care, we have created designated Provider Care Teams.  These Care Teams include your primary Cardiologist (physician) and Advanced Practice Providers (APPs -  Physician Assistants and Nurse Practitioners) who all work together to provide you with the care you need, when you need it.  Your next appointment:   5-6 month(s)  The format for your next appointment:   In Person  Provider:   You may see Rudean Haskell, MD  or one of the following Advanced Practice Providers on your designated Care Team:   Melina Copa, PA-C Ermalinda Barrios, PA-C

## 2020-11-15 ENCOUNTER — Other Ambulatory Visit: Payer: Self-pay

## 2020-11-15 ENCOUNTER — Ambulatory Visit (HOSPITAL_COMMUNITY): Payer: PPO | Attending: Cardiology

## 2020-11-15 DIAGNOSIS — I5020 Unspecified systolic (congestive) heart failure: Secondary | ICD-10-CM | POA: Diagnosis not present

## 2020-11-15 DIAGNOSIS — I2510 Atherosclerotic heart disease of native coronary artery without angina pectoris: Secondary | ICD-10-CM | POA: Insufficient documentation

## 2020-11-15 DIAGNOSIS — I484 Atypical atrial flutter: Secondary | ICD-10-CM | POA: Diagnosis not present

## 2020-11-15 LAB — ECHOCARDIOGRAM COMPLETE
Area-P 1/2: 4.91 cm2
S' Lateral: 2.7 cm

## 2020-11-22 NOTE — Progress Notes (Signed)
Called and reviewed DCCV instructions with pt. He had no questions or concerns.

## 2020-11-23 NOTE — Addendum Note (Signed)
Addended by: Rudean Haskell A on: 11/23/2020 01:45 PM   Modules accepted: Orders, SmartSet

## 2020-11-27 ENCOUNTER — Encounter (HOSPITAL_COMMUNITY)
Admission: RE | Disposition: A | Discharge status: Home / Self Care | Payer: Self-pay | Source: Home / Self Care | Attending: Cardiology

## 2020-11-27 ENCOUNTER — Other Ambulatory Visit: Payer: Self-pay

## 2020-11-27 ENCOUNTER — Ambulatory Visit (HOSPITAL_COMMUNITY): Payer: PPO | Admitting: Certified Registered Nurse Anesthetist

## 2020-11-27 ENCOUNTER — Ambulatory Visit (HOSPITAL_COMMUNITY)
Admission: RE | Admit: 2020-11-27 | Discharge: 2020-11-27 | Disposition: A | Discharge status: Home / Self Care | Payer: PPO | Attending: Cardiology | Admitting: Cardiology

## 2020-11-27 ENCOUNTER — Encounter (HOSPITAL_COMMUNITY): Payer: Self-pay | Admitting: Cardiology

## 2020-11-27 DIAGNOSIS — Z886 Allergy status to analgesic agent status: Secondary | ICD-10-CM | POA: Insufficient documentation

## 2020-11-27 DIAGNOSIS — Z87891 Personal history of nicotine dependence: Secondary | ICD-10-CM | POA: Insufficient documentation

## 2020-11-27 DIAGNOSIS — I5022 Chronic systolic (congestive) heart failure: Secondary | ICD-10-CM | POA: Insufficient documentation

## 2020-11-27 DIAGNOSIS — Z79899 Other long term (current) drug therapy: Secondary | ICD-10-CM | POA: Insufficient documentation

## 2020-11-27 DIAGNOSIS — I484 Atypical atrial flutter: Secondary | ICD-10-CM | POA: Insufficient documentation

## 2020-11-27 DIAGNOSIS — Z7984 Long term (current) use of oral hypoglycemic drugs: Secondary | ICD-10-CM | POA: Diagnosis not present

## 2020-11-27 DIAGNOSIS — Z7901 Long term (current) use of anticoagulants: Secondary | ICD-10-CM | POA: Diagnosis not present

## 2020-11-27 DIAGNOSIS — R001 Bradycardia, unspecified: Secondary | ICD-10-CM | POA: Insufficient documentation

## 2020-11-27 DIAGNOSIS — I4891 Unspecified atrial fibrillation: Secondary | ICD-10-CM | POA: Diagnosis not present

## 2020-11-27 DIAGNOSIS — N183 Chronic kidney disease, stage 3 unspecified: Secondary | ICD-10-CM | POA: Insufficient documentation

## 2020-11-27 DIAGNOSIS — I130 Hypertensive heart and chronic kidney disease with heart failure and stage 1 through stage 4 chronic kidney disease, or unspecified chronic kidney disease: Secondary | ICD-10-CM | POA: Insufficient documentation

## 2020-11-27 DIAGNOSIS — E860 Dehydration: Secondary | ICD-10-CM | POA: Diagnosis not present

## 2020-11-27 DIAGNOSIS — I2510 Atherosclerotic heart disease of native coronary artery without angina pectoris: Secondary | ICD-10-CM | POA: Diagnosis not present

## 2020-11-27 DIAGNOSIS — Z880 Allergy status to penicillin: Secondary | ICD-10-CM | POA: Diagnosis not present

## 2020-11-27 DIAGNOSIS — E782 Mixed hyperlipidemia: Secondary | ICD-10-CM | POA: Diagnosis not present

## 2020-11-27 HISTORY — PX: CARDIOVERSION: SHX1299

## 2020-11-27 LAB — POCT I-STAT, CHEM 8
BUN: 32 mg/dL — ABNORMAL HIGH (ref 8–23)
Calcium, Ion: 1.13 mmol/L — ABNORMAL LOW (ref 1.15–1.40)
Chloride: 112 mmol/L — ABNORMAL HIGH (ref 98–111)
Creatinine, Ser: 1.4 mg/dL — ABNORMAL HIGH (ref 0.61–1.24)
Glucose, Bld: 95 mg/dL (ref 70–99)
HCT: 43 % (ref 39.0–52.0)
Hemoglobin: 14.6 g/dL (ref 13.0–17.0)
Potassium: 4.6 mmol/L (ref 3.5–5.1)
Sodium: 142 mmol/L (ref 135–145)
TCO2: 23 mmol/L (ref 22–32)

## 2020-11-27 SURGERY — CARDIOVERSION
Anesthesia: General

## 2020-11-27 MED ORDER — SODIUM CHLORIDE 0.9 % IV SOLN: INTRAVENOUS | Status: DC | PRN

## 2020-11-27 MED ORDER — PROPOFOL 10 MG/ML IV BOLUS
INTRAVENOUS | Status: DC | PRN
  Administered 2020-11-27: 70 mg via INTRAVENOUS

## 2020-11-27 MED ORDER — LIDOCAINE 2% (20 MG/ML) 5 ML SYRINGE
INTRAMUSCULAR | Status: DC | PRN
  Administered 2020-11-27: 80 mg via INTRAVENOUS

## 2020-11-27 NOTE — Interval H&P Note (Signed)
History and Physical Interval Note:  11/27/2020 8:20 AM  [name]  has presented today for surgery, with the diagnosis of A-FLUTTER.  The various methods of treatment have been discussed with the patient and family. After consideration of risks, benefits and other options for treatment, the patient has consented to  Procedure(s): CARDIOVERSION (N/A) as a surgical intervention.  The patient's history has been reviewed, patient examined, no change in status, stable for surgery.  I have reviewed the patient's chart and labs.  Questions were answered to the patient's satisfaction.     [name] Harrell Gave

## 2020-11-27 NOTE — Anesthesia Postprocedure Evaluation (Signed)
Anesthesia Post Note  Patient: [name]  Procedure(s) Performed: CARDIOVERSION     Patient location during evaluation: PACU Anesthesia Type: General Level of consciousness: awake and alert Pain management: pain level controlled Vital Signs Assessment: post-procedure vital signs reviewed and stable Respiratory status: spontaneous breathing, nonlabored ventilation, respiratory function stable and patient connected to nasal cannula oxygen Cardiovascular status: blood pressure returned to baseline and stable Postop Assessment: no apparent nausea or vomiting Anesthetic complications: no   No notable events documented.  Last Vitals:  Vitals:   11/27/20 0844 11/27/20 0854  BP: 116/67 119/72  Pulse: 67 67  Resp: 13 16  Temp:    SpO2: 98% 99%    Last Pain:  Vitals:   11/27/20 0854  TempSrc:   PainSc: 0-No pain                 [name] S

## 2020-11-27 NOTE — Anesthesia Preprocedure Evaluation (Signed)
Anesthesia Evaluation  Patient identified by MRN, date of birth, ID band Patient awake    Reviewed: Allergy & Precautions, NPO status , Patient's Chart, lab work & pertinent test results  Airway Mallampati: II  TM Distance: >3 FB Neck ROM: Full    Dental no notable dental hx.    Pulmonary neg pulmonary ROS, former smoker,    Pulmonary exam normal breath sounds clear to auscultation       Cardiovascular hypertension, +CHF  + dysrhythmias Atrial Fibrillation  Rhythm:Irregular Rate:Normal  Left ventricular ejection fraction, by estimation, is 35 to 40%. The  left ventricle has moderately decreased function. The left ventricle has  no regional wall motion abnormalities. Left ventricular diastolic function  could not be evaluated. Elevated  left ventricular end-diastolic pressure. There is akinesis of the left  ventricular, apical anterior wall, inferior wall and inferolateral wall.  There is akinesis of the left ventricular, apical segment. There is severe  hypokinesis of the left  ventricular, basal-mid anteroseptal wall. There is hypokinesis of the left  ventricular, basal-mid inferoseptal wall.  2. Right ventricular systolic function is normal. The right ventricular  size is normal. There is normal pulmonary artery systolic pressure. The  estimated right ventricular systolic pressure is [id number] mmHg.  3. Left atrial size was severely dilated.  4. Right atrial size was severely dilated.  5. The mitral valve is normal in structure. Trivial mitral valve  regurgitation. No evidence of mitral stenosis.  6. The aortic valve is tricuspid. Aortic valve regurgitation is trivial.  Mild aortic valve sclerosis is present, with no evidence of aortic valve  stenosis.  7. Aortic dilatation noted. There is borderline dilatation of the aortic  root, measuring 37 mm. There is mild dilatation of the ascending aorta,  measuring 38 mm.     Neuro/Psych TIAnegative psych ROS   GI/Hepatic negative GI ROS, Neg liver ROS,   Endo/Other  negative endocrine ROS  Renal/GU negative Renal ROS  negative genitourinary   Musculoskeletal negative musculoskeletal ROS (+)   Abdominal   Peds negative pediatric ROS (+)  Hematology negative hematology ROS (+)   Anesthesia Other Findings   Reproductive/Obstetrics negative OB ROS                             Anesthesia Physical Anesthesia Plan  ASA: 3  Anesthesia Plan: General   Post-op Pain Management:    Induction: Intravenous  PONV Risk Score and Plan: 2 and Ondansetron, Dexamethasone and Treatment may vary due to age or medical condition  Airway Management Planned: Mask  Additional Equipment:   Intra-op Plan:   Post-operative Plan: Extubation in OR  Informed Consent: I have reviewed the patients History and Physical, chart, labs and discussed the procedure including the risks, benefits and alternatives for the proposed anesthesia with the patient or authorized representative who has indicated his/her understanding and acceptance.     Dental advisory given  Plan Discussed with: CRNA and Surgeon  Anesthesia Plan Comments:         Anesthesia Quick Evaluation

## 2020-11-27 NOTE — Discharge Instructions (Signed)
Electrical Cardioversion  Electrical cardioversion is the delivery of a jolt of electricity to restore a normal rhythm to the heart. A rhythm that is too fast or is not regular keeps the heart from pumping well. In this procedure, sticky patches or metal paddles are placed on the chest to deliver electricity to the heart from a device.  What can I expect after the procedure?  Your blood pressure, heart rate, breathing rate, and blood oxygen level will be monitored until you leave the hospital or clinic.  Your heart rhythm will be watched to make sure it does not change.  You may have some redness on the skin where the shocks were given.If this occurs, can use hydrocortisone cream or Aloe vera.  Follow these instructions at home:  Do not drive for 24 hours if you were given a sedative during your procedure.  Take over-the-counter and prescription medicines only as told by your health care provider.  Ask your health care provider how to check your pulse. Check it often.  Rest for 48 hours after the procedure or as told by your health care provider.  Avoid or limit your caffeine use as told by your health care provider.  Keep all follow-up visits as told by your health care provider. This is important.  Contact a health care provider if:  You feel like your heart is beating too quickly or your pulse is not regular.  You have a serious muscle cramp that does not go away.  Get help right away if:  You have discomfort in your chest.  You are dizzy or you feel faint.  You have trouble breathing or you are short of breath.  Your speech is slurred.  You have trouble moving an arm or leg on one side of your body.  Your fingers or toes turn cold or blue.  Summary  Electrical cardioversion is the delivery of a jolt of electricity to restore a normal rhythm to the heart.  This procedure may be done right away in an emergency or may be a scheduled procedure if the condition is not  an emergency.  Generally, this is a safe procedure.  After the procedure, check your pulse often as told by your health care provider.  This information is not intended to replace advice given to you by your health care provider. Make sure you discuss any questions you have with your health care provider. Document Revised: 10/04/2018 Document Reviewed: 10/04/2018 Elsevier Patient Education  2021 Elsevier Inc.  

## 2020-11-27 NOTE — CV Procedure (Signed)
Procedure:   DCCV  Indication:  Symptomatic atrial fibrillation/atypical atrial flutter  Procedure Note:  The patient signed informed consent.  They have had had therapeutic anticoagulation with apixaban greater than 3 weeks.  Anesthesia was administered by Dr. Kalman Shan.  Patient received 80 mg IV lidocaine and 70 mg IV propofol.Adequate airway was maintained throughout and vital followed per protocol.  They were cardioverted x 1 with 120J of biphasic synchronized energy.  They converted to NSR.  There were no apparent complications.  The patient had normal neuro status and respiratory status post procedure with vitals stable as recorded elsewhere.    Follow up:  They will continue on current medical therapy and follow up with cardiology as scheduled.  Buford Dresser, MD PhD 11/27/2020 8:33 AM

## 2020-11-27 NOTE — Transfer of Care (Signed)
Immediate Anesthesia Transfer of Care Note  Patient: [name]  Procedure(s) Performed: CARDIOVERSION  Patient Location: Endoscopy Unit  Anesthesia Type:General  Level of Consciousness: drowsy and patient cooperative  Airway & Oxygen Therapy: Patient Spontanous Breathing  Post-op Assessment: Report given to RN and Post -op Vital signs reviewed and stable  Post vital signs: Reviewed  Last Vitals:  Vitals Value Taken Time  BP 106/67   Temp    Pulse 68 11/27/20 0836  Resp 15 11/27/20 0836  SpO2 100 % 11/27/20 0836  Vitals shown include unvalidated device data.  Last Pain:  Vitals:   11/27/20 0736  TempSrc: Temporal  PainSc: 0-No pain         Complications: No notable events documented.

## 2020-11-27 NOTE — Anesthesia Procedure Notes (Addendum)
Procedure Name: General with mask airway Date/Time: 11/27/2020 8:29 AM Performed by: Janene Harvey, CRNA Pre-anesthesia Checklist: Patient identified, Emergency Drugs available, Suction available and Patient being monitored Patient Re-evaluated:Patient Re-evaluated prior to induction Oxygen Delivery Method: Ambu bag Preoxygenation: Pre-oxygenation with 100% oxygen Induction Type: IV induction Placement Confirmation: positive ETCO2 Dental Injury: Teeth and Oropharynx as per pre-operative assessment

## 2020-11-29 ENCOUNTER — Other Ambulatory Visit: Payer: Self-pay | Admitting: Family Medicine

## 2020-12-23 ENCOUNTER — Other Ambulatory Visit: Payer: Self-pay | Admitting: Family Medicine

## 2021-01-04 ENCOUNTER — Telehealth: Payer: Self-pay | Admitting: Pharmacist

## 2021-01-04 NOTE — Progress Notes (Signed)
    Chronic Care Management Pharmacy Assistant   Name: [name]  MRN: [id number] DOB: [birth year]  Reason for Encounter: General Disease State Call   Conditions to be addressed/monitored: hypertension, CHF, CAD, hyperlipidemia, history of TIA.  Recent office visits:  None since 07/23/20  Recent consult visits:  10/29/20 Cardiology Vivia Budge, MD. For atypical atrial flutter.  10/24/20 Optometry [facility] visits:  11/27/20 [facility] Buford Dresser, MD (1 Hour) For CARDIOVERSION. No medication changes.  Medications: Outpatient Encounter Medications as of 01/04/2021  Medication Sig   acetaminophen (TYLENOL) 325 MG tablet Take 325-650 mg by mouth every 6 (six) hours as needed for moderate pain or headache.   atorvastatin (LIPITOR) 40 MG tablet TAKE 1 TABLET BY MOUTH EVERY DAY   Coenzyme Q10 (COQ10) 200 MG CAPS Take 200 mg by mouth every evening.   ELIQUIS 5 MG TABS tablet TAKE 1 TABLET BY MOUTH TWICE A DAY   empagliflozin (JARDIANCE) 25 MG TABS tablet Take 1 tablet (25 mg total) by mouth daily before breakfast.   ezetimibe (ZETIA) 10 MG tablet Take 1 tablet (10 mg total) by mouth daily.   Melatonin 5 MG TABS Take 5 mg by mouth at bedtime.   metoprolol succinate (TOPROL XL) 25 MG 24 hr tablet Take 0.5 tablets (12.5 mg total) by mouth daily.   sacubitril-valsartan (ENTRESTO) 49-51 MG Take 1 tablet by mouth 2 (two) times daily.   No facility-administered encounter medications on file as of 01/04/2021.   GEN CALL: Patients wife stated he is doing great. Patients wife stated he plays golf a lot. She stated he has good appetite and eats a well rounded diet. She stated he recently had a procedure done and it was a success and since then he has not had any problems. She stated he doesn't have any questions or concerns about his medications at this time.  Care Gaps:Scheduled patients AWV for 01/24/21 at 815 am.    Star Rating Drugs: Atorvastatin 40 mg 12/24/20 , Jardiance 25 mg 12/22/20 90 DS.  Follow-Up:Pharmacist Review  Charlann Lange, [place] Pharmacist Assistant [phone]

## 2021-01-22 DIAGNOSIS — L814 Other melanin hyperpigmentation: Secondary | ICD-10-CM | POA: Diagnosis not present

## 2021-01-22 DIAGNOSIS — D225 Melanocytic nevi of trunk: Secondary | ICD-10-CM | POA: Diagnosis not present

## 2021-01-22 DIAGNOSIS — L570 Actinic keratosis: Secondary | ICD-10-CM | POA: Diagnosis not present

## 2021-01-22 DIAGNOSIS — Z85820 Personal history of malignant melanoma of skin: Secondary | ICD-10-CM | POA: Diagnosis not present

## 2021-01-22 DIAGNOSIS — Z08 Encounter for follow-up examination after completed treatment for malignant neoplasm: Secondary | ICD-10-CM | POA: Diagnosis not present

## 2021-01-22 DIAGNOSIS — L821 Other seborrheic keratosis: Secondary | ICD-10-CM | POA: Diagnosis not present

## 2021-01-24 ENCOUNTER — Other Ambulatory Visit: Payer: Self-pay

## 2021-01-24 ENCOUNTER — Ambulatory Visit (INDEPENDENT_AMBULATORY_CARE_PROVIDER_SITE_OTHER): Payer: PPO

## 2021-01-24 VITALS — Ht 70.0 in | Wt 162.0 lb

## 2021-01-24 DIAGNOSIS — Z0000 Encounter for general adult medical examination without abnormal findings: Secondary | ICD-10-CM | POA: Diagnosis not present

## 2021-01-24 NOTE — Patient Instructions (Signed)
[name] , Thank you for taking time to come for your Medicare Wellness Visit. I appreciate your ongoing commitment to your health goals. Please review the following plan we discussed and let me know if I can assist you in the future.   Screening recommendations/referrals: Colonoscopy: Done 06/12/2016. No longer required.  Recommended yearly ophthalmology/optometry visit for glaucoma screening and checkup Recommended yearly dental visit for hygiene and checkup  Vaccinations: Influenza vaccine: Done 12/19/2020 Pneumococcal vaccine: Done 11/16/2010 and 12/27/2014 Tdap vaccine: 08/16/2011 Shingles vaccine: Done 12/31/2010, 03/05/2017 and 05/11/2017   Covid-19: Done 03/30/2019, 04/19/2019, 01/23/2020 and 12/19/2020.  Advanced directives: Please bring a copy of your health care power of attorney and living will to the office to be added to your chart at your convenience.   Conditions/risks identified: KEEP UP THE GOOD WORK!!!  Next appointment: Follow up in one year for your annual wellness visit. 2023.  Preventive Care 18 Years and Older, Male  Preventive care refers to lifestyle choices and visits with your health care provider that can promote health and wellness. What does preventive care include? A yearly physical exam. This is also called an annual well check. Dental exams once or twice a year. Routine eye exams. Ask your health care provider how often you should have your eyes checked. Personal lifestyle choices, including: Daily care of your teeth and gums. Regular physical activity. Eating a healthy diet. Avoiding tobacco and drug use. Limiting alcohol use. Practicing safe sex. Taking low doses of aspirin every day. Taking vitamin and mineral supplements as recommended by your health care provider. What happens during an annual well check? The services and screenings done by your health care provider during your annual well check will depend on your age, overall health, lifestyle  risk factors, and family history of disease. Counseling  Your health care provider may ask you questions about your: Alcohol use. Tobacco use. Drug use. Emotional well-being. Home and relationship well-being. Sexual activity. Eating habits. History of falls. Memory and ability to understand (cognition). Work and work Statistician. Screening  You may have the following tests or measurements: Height, weight, and BMI. Blood pressure. Lipid and cholesterol levels. These may be checked every 5 years, or more frequently if you are over 46 years old. Skin check. Lung cancer screening. You may have this screening every year starting at age 19 if you have a 30-pack-year history of smoking and currently smoke or have quit within the past 15 years. Fecal occult blood test (FOBT) of the stool. You may have this test every year starting at age 71. Flexible sigmoidoscopy or colonoscopy. You may have a sigmoidoscopy every 5 years or a colonoscopy every 10 years starting at age 108. Prostate cancer screening. Recommendations will vary depending on your family history and other risks. Hepatitis C blood test. Hepatitis B blood test. Sexually transmitted disease (STD) testing. Diabetes screening. This is done by checking your blood sugar (glucose) after you have not eaten for a while (fasting). You may have this done every 1-3 years. Abdominal aortic aneurysm (AAA) screening. You may need this if you are a current or former smoker. Osteoporosis. You may be screened starting at age 73 if you are at high risk. Talk with your health care provider about your test results, treatment options, and if necessary, the need for more tests. Vaccines  Your health care provider may recommend certain vaccines, such as: Influenza vaccine. This is recommended every year. Tetanus, diphtheria, and acellular pertussis (Tdap, Td) vaccine. You may need a  Td booster every 10 years. Zoster vaccine. You may need this after age  90. Pneumococcal 13-valent conjugate (PCV13) vaccine. One dose is recommended after age 16. Pneumococcal polysaccharide (PPSV23) vaccine. One dose is recommended after age 69. Talk to your health care provider about which screenings and vaccines you need and how often you need them. This information is not intended to replace advice given to you by your health care provider. Make sure you discuss any questions you have with your health care provider. Document Released: 03/30/2015 Document Revised: 11/21/2015 Document Reviewed: 01/02/2015 Elsevier Interactive Patient Education  2017 [employer] Prevention in the Home Falls can cause injuries. They can happen to people of all ages. There are many things you can do to make your home safe and to help prevent falls. What can I do on the outside of my home? Regularly fix the edges of walkways and driveways and fix any cracks. Remove anything that might make you trip as you walk through a door, such as a raised step or threshold. Trim any bushes or trees on the path to your home. Use bright outdoor lighting. Clear any walking paths of anything that might make someone trip, such as rocks or tools. Regularly check to see if handrails are loose or broken. Make sure that both sides of any steps have handrails. Any raised decks and porches should have guardrails on the edges. Have any leaves, snow, or ice cleared regularly. Use sand or salt on walking paths during winter. Clean up any spills in your garage right away. This includes oil or grease spills. What can I do in the bathroom? Use night lights. Install grab bars by the toilet and in the tub and shower. Do not use towel bars as grab bars. Use non-skid mats or decals in the tub or shower. If you need to sit down in the shower, use a plastic, non-slip stool. Keep the floor dry. Clean up any water that spills on the floor as soon as it happens. Remove soap buildup in the tub or shower  regularly. Attach bath mats securely with double-sided non-slip rug tape. Do not have throw rugs and other things on the floor that can make you trip. What can I do in the bedroom? Use night lights. Make sure that you have a light by your bed that is easy to reach. Do not use any sheets or blankets that are too big for your bed. They should not hang down onto the floor. Have a firm chair that has side arms. You can use this for support while you get dressed. Do not have throw rugs and other things on the floor that can make you trip. What can I do in the kitchen? Clean up any spills right away. Avoid walking on wet floors. Keep items that you use a lot in easy-to-reach places. If you need to reach something above you, use a strong step stool that has a grab bar. Keep electrical cords out of the way. Do not use floor polish or wax that makes floors slippery. If you must use wax, use non-skid floor wax. Do not have throw rugs and other things on the floor that can make you trip. What can I do with my stairs? Do not leave any items on the stairs. Make sure that there are handrails on both sides of the stairs and use them. Fix handrails that are broken or loose. Make sure that handrails are as long as the stairways. Check any  carpeting to make sure that it is firmly attached to the stairs. Fix any carpet that is loose or worn. Avoid having throw rugs at the top or bottom of the stairs. If you do have throw rugs, attach them to the floor with carpet tape. Make sure that you have a light switch at the top of the stairs and the bottom of the stairs. If you do not have them, ask someone to add them for you. What else can I do to help prevent falls? Wear shoes that: Do not have high heels. Have rubber bottoms. Are comfortable and fit you well. Are closed at the toe. Do not wear sandals. If you use a stepladder: Make sure that it is fully opened. Do not climb a closed stepladder. Make sure that  both sides of the stepladder are locked into place. Ask someone to hold it for you, if possible. Clearly mark and make sure that you can see: Any grab bars or handrails. First and last steps. Where the edge of each step is. Use tools that help you move around (mobility aids) if they are needed. These include: Canes. Walkers. Scooters. Crutches. Turn on the lights when you go into a dark area. Replace any light bulbs as soon as they burn out. Set up your furniture so you have a clear path. Avoid moving your furniture around. If any of your floors are uneven, fix them. If there are any pets around you, be aware of where they are. Review your medicines with your doctor. Some medicines can make you feel dizzy. This can increase your chance of falling. Ask your doctor what other things that you can do to help prevent falls. This information is not intended to replace advice given to you by your health care provider. Make sure you discuss any questions you have with your health care provider. Document Released: 12/28/2008 Document Revised: 08/09/2015 Document Reviewed: 04/07/2014 Elsevier Interactive Patient Education  2017 [employer].

## 2021-01-24 NOTE — Progress Notes (Signed)
Subjective:   [name] is a 80 y.o. male who presents for an Initial Medicare Annual Wellness Visit. Virtual Visit via Telephone Note  I connected with  [name] on 01/24/21 at  8:15 AM EST by telephone and verified that I am speaking with the correct person using two identifiers.  Location: Patient: Home Provider: [facility] Persons participating in the virtual visit: patient/Nurse Health Advisor   I discussed the limitations, risks, security and privacy concerns of performing an evaluation and management service by telephone and the availability of in person appointments. The patient expressed understanding and agreed to proceed.  Interactive audio and video telecommunications were attempted between this nurse and patient, however failed, due to patient having technical difficulties OR patient did not have access to video capability.  We continued and completed visit with audio only.  Some vital signs may be absent or patient reported.   Chriss Driver, LPN  Review of Systems     Cardiac Risk Factors include: advanced age (>70men, >82 women);hypertension;dyslipidemia;male gender     Objective:    Today's Vitals   01/24/21 0816  Weight: 162 lb (73.5 kg)  Height: 5\' 10"  (1.778 m)   Body mass index is 23.24 kg/m.  Advanced Directives 01/24/2021 11/27/2020 08/26/2018 12/02/2013 12/11/2011 12/11/2011  Does Patient Have a Medical Advance Directive? Yes Yes Yes No Patient has advance directive, copy not in chart -  Type of Advance Directive [place];Living will [place];Living will [place];Living will - - -  Does patient want to make changes to medical advance directive? - - No - Patient declined - - -  Copy of [place] in Chart? No - copy requested No - copy requested No - copy requested - Copy requested from other (Comment) -  Pre-existing out of facility DNR order (yellow form or pink MOST  form) - - - - - No    Current Medications (verified) Outpatient Encounter Medications as of 01/24/2021  Medication Sig   acetaminophen (TYLENOL) 325 MG tablet Take 325-650 mg by mouth every 6 (six) hours as needed for moderate pain or headache.   atorvastatin (LIPITOR) 40 MG tablet TAKE 1 TABLET BY MOUTH EVERY DAY   Coenzyme Q10 (COQ10) 200 MG CAPS Take 200 mg by mouth every evening.   ELIQUIS 5 MG TABS tablet TAKE 1 TABLET BY MOUTH TWICE A DAY   empagliflozin (JARDIANCE) 25 MG TABS tablet Take 1 tablet (25 mg total) by mouth daily before breakfast.   ezetimibe (ZETIA) 10 MG tablet Take 1 tablet (10 mg total) by mouth daily.   FLUZONE HIGH-DOSE QUADRIVALENT 0.7 ML SUSY    Melatonin 5 MG TABS Take 5 mg by mouth at bedtime.   metoprolol succinate (TOPROL XL) 25 MG 24 hr tablet Take 0.5 tablets (12.5 mg total) by mouth daily.   PFIZER COVID-19 VAC BIVALENT injection    sacubitril-valsartan (ENTRESTO) 49-51 MG Take 1 tablet by mouth 2 (two) times daily.   No facility-administered encounter medications on file as of 01/24/2021.    Allergies (verified) Aldactone [spironolactone], Nsaids, Crestor [rosuvastatin calcium], and Penicillins   History: Past Medical History:  Diagnosis Date   Atrial flutter ([place])    a. s/p CTI ablation x2 by Dr Rayann Heman 10/12, 9/13   Basal cell carcinoma of cheek    BPH (benign prostatic hyperplasia)    CAD (coronary artery disease)    nonobstructive on cath 08/2018   CHF (congestive  heart failure) (HCC)    ef 35% on Echo 2020   CKD (chronic kidney disease) stage 3, GFR 30-59 ml/min (HCC)    Colon polyps    Diverticulosis    Ectopic atrial tachycardia ([place])    a. Dx 07/2015: rate controlled    Hemorrhoids    Hyperlipidemia    Hypertension    Hypertrophic cardiomyopathy (HCC)    TIA (transient ischemic attack)    Past Surgical History:  Procedure Laterality Date   ATRIAL ABLATION SURGERY  10/12   CTI ablation for atrial flutter by Dr Rayann Heman   ATRIAL  FLUTTER ABLATION N/A 12/11/2011   CTI ablation for atrial flutter by Dr Rayann Heman   BASAL CELL CARCINOMA EXCISION  ~ 1973   left cheek   CARDIAC CATHETERIZATION  1960's   CARDIOVERSION N/A 11/27/2020   Procedure: CARDIOVERSION;  Surgeon: Buford Dresser, MD;  Location: [facility];  Service: Cardiovascular;  Laterality: N/A;   HERNIA REPAIR  ~ 8119   umbilical   JOINT REPLACEMENT     RIGHT/LEFT HEART CATH AND CORONARY ANGIOGRAPHY N/A 08/26/2018   Procedure: RIGHT/LEFT HEART CATH AND CORONARY ANGIOGRAPHY;  Surgeon: Sherren Mocha, MD;  Location: [facility];  Service: Cardiovascular;  Laterality: N/A;   TONSILLECTOMY AND ADENOIDECTOMY  1947   TOTAL HIP ARTHROPLASTY     left   Family History  Problem Relation Age of Onset   Breast cancer Mother    Ulcers Father        stomach   COPD Father    Tuberculosis Father    Colon polyps Brother    Tuberculosis Brother    Brain cancer Brother    Colon cancer Neg Hx    Stomach cancer Neg Hx    Rectal cancer Neg Hx    Esophageal cancer Neg Hx    Liver cancer Neg Hx    Social History   Socioeconomic History   Marital status: Married    Spouse name: Not on file   Number of children: 2   Years of education: Not on file   Highest education level: Not on file  Occupational History   Occupation: retired  Tobacco Use   Smoking status: Former    Packs/day: 2.00    Years: 10.00    Pack years: 20.00    Types: Cigarettes    Quit date: 03/17/1970    Years since quitting: 50.8   Smokeless tobacco: Never  Vaping Use   Vaping Use: Never used  Substance and Sexual Activity   Alcohol use: No    Comment: quit 1995   Drug use: No   Sexual activity: Yes  Other Topics Concern   Not on file  Social History Narrative   Lives in [place] with spouse.  2 Grown children.  Retired from [employer]   Married x 32 years in 2022.   Social Determinants of Health   Financial Resource Strain: Low Risk    Difficulty of  Paying Living Expenses: Not hard at all  Food Insecurity: No Food Insecurity   Worried About Charity fundraiser in the Last Year: Never true   [place] in the Last Year: Never true  Transportation Needs: No Transportation Needs   Lack of Transportation (Medical): No   Lack of Transportation (Non-Medical): No  Physical Activity: Sufficiently Active   Days of Exercise per Week: 5 days   Minutes of Exercise per Session: 60 min  Stress: No Stress Concern Present   Feeling  of Stress : Not at all  Social Connections: Socially Integrated   Frequency of Communication with Friends and Family: More than three times a week   Frequency of Social Gatherings with Friends and Family: More than three times a week   Attends Religious Services: More than 4 times per year   Active Member of [employer] or Organizations: Yes   Attends Music therapist: More than 4 times per year   Marital Status: Married    Tobacco Counseling Counseling given: Not Answered   Clinical Intake:  Pre-visit preparation completed: Yes  Pain : No/denies pain     BMI - recorded: 23.24 Nutritional Status: BMI of 19-24  Normal Nutritional Risks: None Diabetes: No  How often do you need to have someone help you when you read instructions, pamphlets, or other written materials from your doctor or pharmacy?: 1 - Never  Diabetic?NO  Interpreter Needed?: No  Information entered by :: MJ Tenille Morrill, LPN   Activities of Daily Living In your present state of health, do you have any difficulty performing the following activities: 01/24/2021  Hearing? N  Vision? N  Difficulty concentrating or making decisions? N  Walking or climbing stairs? N  Dressing or bathing? N  Doing errands, shopping? N  Preparing Food and eating ? N  Using the Toilet? N  In the past six months, have you accidently leaked urine? N  Do you have problems with loss of bowel control? N  Managing your Medications? N  Managing your  Finances? N  Housekeeping or managing your Housekeeping? N  Some recent data might be hidden    Patient Care Team: Susy Frizzle, MD as PCP - General (Family Medicine) Thompson Grayer, MD as PCP - Cardiology (Cardiology) Thompson Grayer, MD as PCP - Electrophysiology (Cardiology) Edythe Clarity, [facility] as Pharmacist (Pharmacist)  Indicate any recent Medical Services you may have received from other than [facility] providers in the past year (date may be approximate).     Assessment:   This is a routine wellness examination for [name].  Hearing/Vision screen Hearing Screening - Comments:: Some hearing issues. Wears hearing aids.  Vision Screening - Comments:: Glasses, readers. [facility] 2022.  Dietary issues and exercise activities discussed: Current Exercise Habits: Home exercise routine, Type of exercise: walking, Time (Minutes): 60, Frequency (Times/Week): 5, Weekly Exercise (Minutes/Week): 300, Intensity: Mild, Exercise limited by: cardiac condition(s)   Goals Addressed   None    Depression Screen PHQ 2/9 Scores 01/24/2021 07/10/2020 05/27/2018 02/18/2018 10/08/2017 11/06/2016 11/06/2016  PHQ - 2 Score 0 0 0 0 0 0 0    Fall Risk Fall Risk  01/24/2021 07/10/2020 05/27/2018 02/18/2018 10/08/2017  Falls in the past year? 0 0 0 0 No  Number falls in past yr: 0 - - - -  Injury with Fall? 0 - - - -  Risk for fall due to : No Fall Risks No Fall Risks - - -  Follow up Falls prevention discussed Falls evaluation completed Falls evaluation completed Falls evaluation completed -    FALL RISK PREVENTION PERTAINING TO THE HOME:  Any stairs in or around the home? Yes  If so, are there any without handrails? No  Home free of loose throw rugs in walkways, pet beds, electrical cords, etc? Yes  Adequate lighting in your home to reduce risk of falls? Yes   ASSISTIVE DEVICES UTILIZED TO PREVENT FALLS:  Life alert? No  Use of a cane, walker or w/c? No  Grab bars in  the bathroom? No   Shower chair or bench in shower? Yes  Elevated toilet seat or a handicapped toilet? No   TIMED UP AND GO:  Was the test performed? No . Phone visit.   Cognitive Function: Normal cognitive status assessed by direct observation by this Nurse Health Advisor. No abnormalities found.          Immunizations Immunization History  Administered Date(s) Administered   Fluad Quad(high Dose 65+) 12/02/2018   Hepatitis A 12/06/1997, 09/14/1998   Hepatitis B 12/06/1997, 01/05/1998, 09/07/1998   Influenza, High Dose Seasonal PF 12/31/2016, 12/17/2017, 01/10/2020   Influenza,inj,Quad PF,6+ Mos 12/28/2012, 01/03/2014, 12/27/2014, 12/18/2015   PFIZER(Purple Top)SARS-COV-2 Vaccination 03/30/2019, 04/19/2019, 12/30/2019   Pfizer Covid-19 Vaccine Bivalent Booster 74yrs & up 12/19/2020   Pneumococcal Conjugate-13 12/27/2014   Pneumococcal-Unspecified 11/16/2010   Tdap 08/16/2011   Zoster Recombinat (Shingrix) 03/05/2017, 05/11/2017   Zoster, Live 12/31/2010    TDAP status: Up to date  Flu Vaccine status: Up to date  Pneumococcal vaccine status: Up to date  Covid-19 vaccine status: Completed vaccines  Qualifies for Shingles Vaccine? Yes   Zostavax completed Yes   Shingrix Completed?: Yes  Screening Tests Health Maintenance  Topic Date Due   Pneumonia Vaccine 59+ Years old (2 - PPSV23 if available, else PCV20) 12/27/2015   INFLUENZA VACCINE  10/15/2020   TETANUS/TDAP  08/24/2021   COVID-19 Vaccine  Completed   Zoster Vaccines- Shingrix  Completed   HPV VACCINES  Aged Out    Health Maintenance  Health Maintenance Due  Topic Date Due   Pneumonia Vaccine 36+ Years old (2 - PPSV23 if available, else PCV20) 12/27/2015   INFLUENZA VACCINE  10/15/2020    Colorectal cancer screening: Type of screening: Colonoscopy. Completed 06/12/2016. Repeat every 10 years  Lung Cancer Screening: (Low Dose CT Chest recommended if Age 31-80 years, 30 pack-year currently smoking OR have quit w/in  15years.) does not qualify.  Quit x 40 years per pt.   Additional Screening:  Hepatitis C Screening: does not qualify;   Vision Screening: Recommended annual ophthalmology exams for early detection of glaucoma and other disorders of the eye. Is the patient up to date with their annual eye exam?  Yes  Who is the provider or what is the name of the office in which the patient attends annual eye exams? [facility] If pt is not established with a provider, would they like to be referred to a provider to establish care? No .   Dental Screening: Recommended annual dental exams for proper oral hygiene  Community Resource Referral / Chronic Care Management: CRR required this visit?  No   CCM required this visit?  No      Plan:     I have personally reviewed and noted the following in the patient's chart:   Medical and social history Use of alcohol, tobacco or illicit drugs  Current medications and supplements including opioid prescriptions. Patient is not currently taking opioid prescriptions. Functional ability and status Nutritional status Physical activity Advanced directives List of other physicians Hospitalizations, surgeries, and ER visits in previous 12 months Vitals Screenings to include cognitive, depression, and falls Referrals and appointments  In addition, I have reviewed and discussed with patient certain preventive protocols, quality metrics, and best practice recommendations. A written personalized care plan for preventive services as well as general preventive health recommendations were provided to patient.     Chriss Driver, LPN   [id number]   Nurse Notes: Up to date on  all health maintenance. No issues per pt.

## 2021-02-15 ENCOUNTER — Other Ambulatory Visit: Payer: Self-pay

## 2021-02-15 MED ORDER — ENTRESTO 49-51 MG PO TABS: 1.0000 | ORAL_TABLET | Freq: Two times a day (BID) | ORAL | 0 refills | Status: DC

## 2021-02-20 ENCOUNTER — Telehealth: Payer: Self-pay

## 2021-02-20 NOTE — Telephone Encounter (Signed)
**Note De-Identified Sondos Wolfman Obfuscation** The pt left the provider page of his [employer] pt asst application for Entresto at the office with a note requesting that we complete the providers page, have Dr Rayann Heman sign it and a printed Entresto 49-51 mg prescription for #180 with 3 refills, and to fax all to [employer].  He stated in the note that he has mailed his part to [employer].  I completed the providers page and wrote a note on the cover letter stating that the pt is mailing his part to them and to add the providers page and the prescription with his application once they receive all.  I have emailed to Dr Bonita Quin nurse so she can obtain his signature on the [place] prescription, the application, and to fax all to [employer] at the fax number written on the cover letter included or to place in the to be faxed basket Medical records to be faxed.

## 2021-02-21 MED ORDER — ENTRESTO 49-51 MG PO TABS: 1.0000 | ORAL_TABLET | Freq: Two times a day (BID) | ORAL | 3 refills | Status: DC

## 2021-02-21 NOTE — Telephone Encounter (Signed)
Pt asst application and prescription printed for Dr. Rayann Heman to sign.

## 2021-02-22 NOTE — Telephone Encounter (Signed)
MD signed. Placed in medical records for faxing.

## 2021-03-06 ENCOUNTER — Other Ambulatory Visit: Payer: Self-pay

## 2021-03-06 ENCOUNTER — Other Ambulatory Visit: Payer: Self-pay | Admitting: *Deleted

## 2021-03-06 MED ORDER — ENTRESTO 49-51 MG PO TABS: 1.0000 | ORAL_TABLET | Freq: Two times a day (BID) | ORAL | 3 refills | Status: DC

## 2021-03-07 NOTE — Telephone Encounter (Signed)
We received a fax from [employer] stating they were missing information for his application.  I called [employer], they were requesting a printed RX, also a date the MD signed the form.  I completed this and re-faxed information, confirmation received.

## 2021-03-25 ENCOUNTER — Telehealth: Payer: Self-pay | Admitting: Pharmacist

## 2021-03-25 MED ORDER — ENTRESTO 49-51 MG PO TABS: 1.0000 | ORAL_TABLET | Freq: Two times a day (BID) | ORAL | 3 refills | Status: DC

## 2021-03-25 NOTE — Telephone Encounter (Signed)
Pt denied pt assistance for [employer] this year. Per [employer] pt is over income limit. I called and spoke with wife (per DPR) and made her aware. [facility].

## 2021-04-24 NOTE — Progress Notes (Signed)
Cardiology Office Note:    Date:  04/25/2021   ID:  [name], DOB Oct 29, 1940, MRN [id number]  PCP:  Susy Frizzle, MD  [facility] Cardiologist: Rudean Haskell MD [place] Electrophysiologist:  Thompson Grayer, MD   CC:  Follow up HFrEF  History of Present Illness:    [name] is a 81 y.o. male with a hx of Atrial flutter s/p multiple CT ablations last in 2013 (6+), AT, Suspicion for Obstructive CAD with LCP in 08/2018 and scar on MRI, Hx of HTN with  HLD , prior TIA. CKD stage III who presented for evaluation for HCM eval 12/22/19.  In interim from this visit had Cardiac MRI for evaluation of etiology of HF:  Found to have disease consistent with prior LAD infarct and improvement of LVEF.  In interim from 11/9 saw Dr. Rayann Heman with no changes need and got paperwork done for [facility].  Last seen 04/26/2020.  Patient notes that he is doing great.   After last visit had DCCV.  Has had issues with qualifying for patient assistance for [employer]. There are no interval hospital/ED visit.    His Kardia noted that he was in sinus rhythm: review of shows atrial flutter.  No chest pain or pressure .  No SOB/DOE and no PND/Orthopnea.  No weight gain or leg swelling.  No palpitations or syncope .     Past Medical History:  Diagnosis Date   Atrial flutter ([place])    a. s/p CTI ablation x2 by Dr Rayann Heman 10/12, 9/13   Basal cell carcinoma of cheek    BPH (benign prostatic hyperplasia)    CAD (coronary artery disease)    nonobstructive on cath 08/2018   CHF (congestive heart failure) ([place])    ef 35% on Echo 2020   CKD (chronic kidney disease) stage 3, GFR 30-59 ml/min (HCC)    Colon polyps    Diverticulosis    Ectopic atrial tachycardia ([place])    a. Dx 07/2015: rate controlled    Hemorrhoids    Hyperlipidemia    Hypertension    Hypertrophic cardiomyopathy (HCC)    TIA (transient ischemic attack)     Past Surgical History:  Procedure Laterality Date   ATRIAL  ABLATION SURGERY  10/12   CTI ablation for atrial flutter by Dr Rayann Heman   ATRIAL FLUTTER ABLATION N/A 12/11/2011   CTI ablation for atrial flutter by Dr Rayann Heman   BASAL CELL CARCINOMA EXCISION  ~ 1973   left cheek   CARDIAC CATHETERIZATION  1960's   CARDIOVERSION N/A 11/27/2020   Procedure: CARDIOVERSION;  Surgeon: Buford Dresser, MD;  Location: [facility];  Service: Cardiovascular;  Laterality: N/A;   HERNIA REPAIR  ~ 0086   umbilical   JOINT REPLACEMENT     RIGHT/LEFT HEART CATH AND CORONARY ANGIOGRAPHY N/A 08/26/2018   Procedure: RIGHT/LEFT HEART CATH AND CORONARY ANGIOGRAPHY;  Surgeon: Sherren Mocha, MD;  Location: [place] [facility];  Service: Cardiovascular;  Laterality: N/A;   TONSILLECTOMY AND ADENOIDECTOMY  1947   TOTAL HIP ARTHROPLASTY     left   Current Medications: Current Meds  Medication Sig   acetaminophen (TYLENOL) 325 MG tablet Take 325-650 mg by mouth every 6 (six) hours as needed for moderate pain or headache.   atorvastatin (LIPITOR) 40 MG tablet TAKE 1 TABLET BY MOUTH EVERY DAY   Coenzyme Q10 (COQ10) 200 MG CAPS Take 200 mg by mouth every evening.   ELIQUIS 5 MG TABS tablet TAKE 1 TABLET BY MOUTH  TWICE A DAY   empagliflozin (JARDIANCE) 25 MG TABS tablet Take 1 tablet (25 mg total) by mouth daily before breakfast.   ezetimibe (ZETIA) 10 MG tablet Take 1 tablet (10 mg total) by mouth daily.   Melatonin 5 MG TABS Take 5 mg by mouth at bedtime.   metoprolol succinate (TOPROL XL) 25 MG 24 hr tablet Take 0.5 tablets (12.5 mg total) by mouth daily.   sacubitril-valsartan (ENTRESTO) 49-51 MG Take 1 tablet by mouth 2 (two) times daily.   [DISCONTINUED] FLUZONE HIGH-DOSE QUADRIVALENT 0.7 ML SUSY    [DISCONTINUED] PFIZER COVID-19 VAC BIVALENT injection     Allergies:   Aldactone [spironolactone], Nsaids, Crestor [rosuvastatin calcium], and Penicillins   Social History   Socioeconomic History   Marital status: Married    Spouse name: Not on file   Number of  children: 2   Years of education: Not on file   Highest education level: Not on file  Occupational History   Occupation: retired  Tobacco Use   Smoking status: Former    Packs/day: 2.00    Years: 10.00    Pack years: 20.00    Types: Cigarettes    Quit date: 03/17/1970    Years since quitting: 51.1   Smokeless tobacco: Never  Vaping Use   Vaping Use: Never used  Substance and Sexual Activity   Alcohol use: No    Comment: quit 1995   Drug use: No   Sexual activity: Yes  Other Topics Concern   Not on file  Social History Narrative   Lives in [place] with spouse.  2 Grown children.  Retired from [employer]   Married x 32 years in 2022.   Social Determinants of Health   Financial Resource Strain: Low Risk    Difficulty of Paying Living Expenses: Not hard at all  Food Insecurity: No Food Insecurity   Worried About Charity fundraiser in the Last Year: Never true   [place] in the Last Year: Never true  Transportation Needs: No Transportation Needs   Lack of Transportation (Medical): No   Lack of Transportation (Non-Medical): No  Physical Activity: Sufficiently Active   Days of Exercise per Week: 5 days   Minutes of Exercise per Session: 60 min  Stress: No Stress Concern Present   Feeling of Stress : Not at all  Social Connections: Socially Integrated   Frequency of Communication with Friends and Family: More than three times a week   Frequency of Social Gatherings with Friends and Family: More than three times a week   Attends Religious Services: More than 4 times per year   Active Member of [employer] or Organizations: Yes   Attends Music therapist: More than 4 times per year   Marital Status: Married    SOCIAL:  Going back to [employer] and doing treadmill and [employer], Married, 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 There is no history of Colon cancer, Stomach cancer, Rectal cancer, Esophageal cancer, or Liver cancer.  ROS:   Please see the history of present illness.    All other systems reviewed and are negative.  EKGs/Labs/Other Studies Reviewed:    The following studies were reviewed today:  EKG: 04/25/21: AFL 60 RBBB  10/29/20: Atrial Flutter 51 with slow ventricular response RBBB  Cardiac MRI: Date: 01/18/20 Results: Normal left ventricular size, thickness and systolic function is (LVEF =43%). There is anteroseptal and inferoseptal (base) hypokinesis with apical hypokinesis. There is no evidence of LVOT obstruction; supine study. There is late gadolinium enhancement in the left ventricular myocardium in the anteroseptal base, and in the anterior, anteroseptal, inferoseptal, and inferior mid segments. There is apical, transmural late gadolinium enhancement.  Transthoracic Echocardiogram: Date: 11/15/20 Results:   1. Left ventricular ejection fraction, by estimation, is 35 to 40%. The  left ventricle has moderately decreased function. The left ventricle has  no regional wall motion abnormalities. Left ventricular diastolic function  could not be evaluated. Elevated  left ventricular end-diastolic pressure. There is akinesis of the left  ventricular, apical anterior wall, inferior wall and inferolateral wall.  There is akinesis of the left ventricular, apical segment. There is severe  hypokinesis of the left  ventricular, basal-mid anteroseptal wall. There is hypokinesis of the left  ventricular, basal-mid inferoseptal wall.   2. Right ventricular systolic function is normal. The right ventricular  size is normal. There is normal pulmonary artery systolic pressure. The  estimated right ventricular systolic pressure is 41.7 mmHg.   3. Left atrial size was severely dilated.    4. Right atrial size was severely dilated.   5. The mitral valve is normal in structure. Trivial mitral valve  regurgitation. No evidence of mitral stenosis.   6. The aortic valve is tricuspid. Aortic valve regurgitation is trivial.  Mild aortic valve sclerosis is present, with no evidence of aortic valve  stenosis.   7. Aortic dilatation noted. There is borderline dilatation of the aortic  root, measuring 37 mm. There is mild dilatation of the ascending aorta,  measuring 38 mm.   8. The inferior vena cava is normal in size with <50% respiratory  variability, suggesting right atrial pressure of 8 mmHg.   Nuclear Medicine Stress Test:  05/21/18:   No exercise associated VT.  Large anterior perfusion defect.  Normal exercise response  NM Stress Testing : Date: 11/24/19 Results: 12/14/19 Conclusion  Visual quantitative assessment is Grade 2 (suggestive of amyloid), however -- H/CL ratio is 1.3  3) Equivocal for TTR amyloidosis (Equivocal: A semi-quantitative visual score of 1 or H/CL ratio 1-1.5)**  Consider cMRI to further evaluate for TTR amyloid  Left/Right Heart Catheterizations: Date: 08/26/2018 Results: 1.  Nonobstructive coronary artery disease with patency of the left main stem, patency of the LAD with minor nonobstructive disease, patency of the circumflex/intermediate, severe stenosis at the ostium of the small distal diagonal branch, and mild nonobstructive disease of a large, dominant RCA 2.  Normal intracardiac hemodynamics in both the right and left heart with normal/low intracardiac filling pressures suggestive of well compensated chronic systolic heart failure  Recent Labs: 07/03/2020: ALT 19; Platelets 153; TSH 3.26 11/27/2020: BUN 32; Creatinine, Ser 1.40; Hemoglobin 14.6; Potassium 4.6; Sodium 142  Recent Lipid Panel    Component Value Date/Time   CHOL 131 07/03/2020 0805   CHOL 138 11/15/2019 0922   TRIG 66 07/03/2020 0805   HDL 57 07/03/2020 0805   HDL 63 11/15/2019  0922   CHOLHDL 2.3 07/03/2020 0805   VLDL 19 11/03/2016 0841   LDLCALC 59 07/03/2020 0805    Physical Exam:    VS:  BP 108/71    Pulse 60    Ht 5\' 10"  (1.778 m)    Wt 77.1 kg    SpO2 98%  BMI 24.39 kg/m     Wt Readings from Last 3 Encounters:  04/25/21 77.1 kg  01/24/21 73.5 kg  11/27/20 73 kg   Gen: No distress  Neck: No JVD Ears: Bilateral Pilar Plate Sign Cardiac: No Rubs or Gallops, no Murmur, IRIR  +2 radial pulses Respiratory: Clear to auscultation bilaterally, normal effort, normal  respiratory rate GI: Soft, nontender, non-distended  MS: No edema;  moves all extremities Integument: Skin feels warm Neuro:  At time of evaluation, alert and oriented to person/place/time/situation  Psych: Normal affect, patient feels well   ASSESSMENT:    1. Atypical atrial flutter ([place])   2. Systolic congestive heart failure, unspecified HF chronicity ([place])   3. Coronary artery disease involving native coronary artery of native heart without angina pectoris      PLAN:     Atrial Flutter and AT - CHADSVASC 6 on DOAC - unclear how long he was in SR post DCCV - asymptomatic but LVEF is 5-10% lower in AF - will have him see EP for discussions for ablation vs AAD  Systolic  Heart Failure with Reduced Ejection Fraction HTN CKD Stage III - NYHA class II, Stage B, euvolemic, etiology from septal MI - presently, no ICD or CRT indication - Euvolemic-  has some mild leg swelling post hard exercise yesterday his weight is going down, we discussed indications to add lasix - On metoprolol succinate 12.5 mg PO Daily  max tolerated dose - ARNI 49-51 max dose tolerated bas on amb BP log, if financial issues will switch to ARM - Hyperkalemia with aldactone - SGLT2i 25 mg PO Daily ([place])   Coronary Artery Disease; Obstructive septal perforator - asymptomatic - continue DOAC - continue statin and zetia, goal LDL < 70  - continue BB - on AC no ASA  Prior diagnosis of borderline TAA  without rupture MRI- AA 33 mm.  Conservative monitoring at this time   Me 6 months  Medication Adjustments/Labs and Tests Ordered: Current medicines are reviewed at length with the patient today.  Concerns regarding medicines are outlined above.  Orders Placed This Encounter  Procedures   EKG 12-Lead    No orders of the defined types were placed in this encounter.    Patient Instructions  Medication Instructions:  Your physician recommends that you continue on your current medications as directed. Please refer to the Current Medication list given to you today.  *If you need a refill on your cardiac medications before your next appointment, please call your pharmacy*   Lab Work: NONE If you have labs (blood work) drawn today and your tests are completely normal, you will receive your results only by: Home (if you have MyChart) OR A paper copy in the mail If you have any lab test that is abnormal or we need to change your treatment, we will call you to review the results.   Testing/Procedures: Your physician has requested that you follow up with  Electrophysiologist/ APP(EP Specialist).    Follow-Up: At [facility], you and your health needs are our priority.  As part of our continuing mission to provide you with exceptional heart care, we have created designated Provider Care Teams.  These Care Teams include your primary Cardiologist (physician) and Advanced Practice Providers (APPs -  Physician Assistants and Nurse Practitioners) who all work together to provide you with the care you need, when you need it.   Your next appointment:   6 month(s)  The format for your next appointment:  In Person  Provider:   Rudean Haskell, MD  or APP    Signed, Werner Lean, MD  04/25/2021 9:34 AM    [place]

## 2021-04-25 ENCOUNTER — Encounter: Payer: Self-pay | Admitting: Internal Medicine

## 2021-04-25 ENCOUNTER — Ambulatory Visit: Payer: PPO | Admitting: Internal Medicine

## 2021-04-25 ENCOUNTER — Other Ambulatory Visit: Payer: Self-pay

## 2021-04-25 VITALS — BP 108/71 | HR 60 | Ht 70.0 in | Wt 170.0 lb

## 2021-04-25 DIAGNOSIS — I484 Atypical atrial flutter: Secondary | ICD-10-CM

## 2021-04-25 DIAGNOSIS — I5020 Unspecified systolic (congestive) heart failure: Secondary | ICD-10-CM | POA: Diagnosis not present

## 2021-04-25 DIAGNOSIS — I2510 Atherosclerotic heart disease of native coronary artery without angina pectoris: Secondary | ICD-10-CM | POA: Diagnosis not present

## 2021-04-25 NOTE — Patient Instructions (Signed)
Medication Instructions:  Your physician recommends that you continue on your current medications as directed. Please refer to the Current Medication list given to you today.  *If you need a refill on your cardiac medications before your next appointment, please call your pharmacy*   Lab Work: NONE If you have labs (blood work) drawn today and your tests are completely normal, you will receive your results only by: [place] (if you have MyChart) OR A paper copy in the mail If you have any lab test that is abnormal or we need to change your treatment, we will call you to review the results.   Testing/Procedures: Your physician has requested that you follow up with  Electrophysiologist/ APP(EP Specialist).    Follow-Up: At [facility], you and your health needs are our priority.  As part of our continuing mission to provide you with exceptional heart care, we have created designated Provider Care Teams.  These Care Teams include your primary Cardiologist (physician) and Advanced Practice Providers (APPs -  Physician Assistants and Nurse Practitioners) who all work together to provide you with the care you need, when you need it.   Your next appointment:   6 month(s)  The format for your next appointment:   In Person  Provider:   Rudean Haskell, MD  or APP

## 2021-05-15 DIAGNOSIS — H40023 Open angle with borderline findings, high risk, bilateral: Secondary | ICD-10-CM | POA: Diagnosis not present

## 2021-05-15 DIAGNOSIS — H02831 Dermatochalasis of right upper eyelid: Secondary | ICD-10-CM | POA: Diagnosis not present

## 2021-05-15 DIAGNOSIS — H02834 Dermatochalasis of left upper eyelid: Secondary | ICD-10-CM | POA: Diagnosis not present

## 2021-05-15 DIAGNOSIS — H26491 Other secondary cataract, right eye: Secondary | ICD-10-CM | POA: Diagnosis not present

## 2021-05-16 ENCOUNTER — Other Ambulatory Visit: Payer: Self-pay | Admitting: Internal Medicine

## 2021-06-07 ENCOUNTER — Other Ambulatory Visit: Payer: Self-pay | Admitting: Family Medicine

## 2021-06-21 ENCOUNTER — Ambulatory Visit: Payer: PPO | Admitting: Cardiology

## 2021-06-21 ENCOUNTER — Encounter: Payer: Self-pay | Admitting: Cardiology

## 2021-06-21 VITALS — BP 122/80 | HR 51 | Ht 70.0 in | Wt 175.8 lb

## 2021-06-21 DIAGNOSIS — I4510 Unspecified right bundle-branch block: Secondary | ICD-10-CM | POA: Diagnosis not present

## 2021-06-21 DIAGNOSIS — I5022 Chronic systolic (congestive) heart failure: Secondary | ICD-10-CM | POA: Diagnosis not present

## 2021-06-21 DIAGNOSIS — I444 Left anterior fascicular block: Secondary | ICD-10-CM | POA: Diagnosis not present

## 2021-06-21 DIAGNOSIS — I422 Other hypertrophic cardiomyopathy: Secondary | ICD-10-CM

## 2021-06-21 DIAGNOSIS — I484 Atypical atrial flutter: Secondary | ICD-10-CM | POA: Diagnosis not present

## 2021-06-21 NOTE — Progress Notes (Signed)
?Electrophysiology Office Note:   ? ?Date:  06/21/2021  ? ?ID:  [name], DOB 05-06-1940, MRN [id number] ? ?PCP:  Susy Frizzle, MD  ?[facility] Cardiologist:  None  ?[facility] Electrophysiologist:  Vickie Epley, MD  ? ?Referring MD: Susy Frizzle, MD  ? ?Chief Complaint: New patient consult for repeat ablation vs. AAD ? ?History of Present Illness:   ? ?[name] is a 81 y.o. male who presents for a consult to discuss repeat ablation at the request of Dr. Gasper Sells. Their medical history includes atrial flutter s/p CTI ablation x2 by Dr. Rayann Heman (10/12, 9/13), ectopic atrial tachycardia, CAD, CHF, hypertrophic cardiomyopathy, hypertension, hyperlipidemia, CKD stage 3, and TIA. Previously followed by Dr. Rayann Heman. ? ?He saw Dr. Gasper Sells 04/25/2021. Generally he was feeling well and asymptomatic, unclear how long he was in SR post DCCV. His EKG at that visit showed Atrial flutter, 60 bpm, RBBB. He was referred to EP for discussion of ablation vs. AAD. ? ?He is accompanied by his wife. Overall, he is feeling okay. Usually he is not able to tell when he is out of rhythm. His most recent episode started a few years ago when he developed shortness of breath. His normal resting heart rate is in the 50s. ? ?Yesterday he was cutting down trees, and hauling them to the truck. He remained asymptomatic. Also has tried pickleball. ? ?He denies any bleeding issues on Eliquis. ? ?He denies any palpitations, chest pain, or peripheral edema. No lightheadedness, headaches, syncope, orthopnea, or PND. ? ? ?  ?Past Medical History:  ?Diagnosis Date  ? Atrial flutter ([place])   ? a. s/p CTI ablation x2 by Dr Rayann Heman 10/12, 9/13  ? Basal cell carcinoma of cheek   ? BPH (benign prostatic hyperplasia)   ? CAD (coronary artery disease)   ? nonobstructive on cath 08/2018  ? CHF (congestive heart failure) ([place])   ? ef 35% on Echo 2020  ? CKD (chronic kidney disease) stage 3, GFR 30-59 ml/min (HCC)   ? Colon  polyps   ? Diverticulosis   ? Ectopic atrial tachycardia ([place])   ? a. Dx 07/2015: rate controlled   ? Hemorrhoids   ? Hyperlipidemia   ? Hypertension   ? Hypertrophic cardiomyopathy ([place])   ? TIA (transient ischemic attack)   ? ? ?Past Surgical History:  ?Procedure Laterality Date  ? ATRIAL ABLATION SURGERY  10/12  ? CTI ablation for atrial flutter by Dr Rayann Heman  ? ATRIAL FLUTTER ABLATION N/A 12/11/2011  ? CTI ablation for atrial flutter by Dr Rayann Heman  ? BASAL CELL CARCINOMA EXCISION  ~ 1973  ? left cheek  ? CARDIAC CATHETERIZATION  1960's  ? CARDIOVERSION N/A 11/27/2020  ? Procedure: CARDIOVERSION;  Surgeon: Buford Dresser, MD;  [facility];  Service: Cardiovascular;  Laterality: N/A;  ? HERNIA REPAIR  ~ 7412  ? umbilical  ? JOINT REPLACEMENT    ? RIGHT/LEFT HEART CATH AND CORONARY ANGIOGRAPHY N/A 08/26/2018  ? Procedure: RIGHT/LEFT HEART CATH AND CORONARY ANGIOGRAPHY;  Surgeon: Sherren Mocha, MD;  Location: [place] [facility];  Service: Cardiovascular;  Laterality: N/A;  ? TONSILLECTOMY AND ADENOIDECTOMY  1947  ? TOTAL HIP ARTHROPLASTY    ? left  ? ? ?Current Medications: ?Current Meds  ?Medication Sig  ? acetaminophen (TYLENOL) 325 MG tablet Take 325-650 mg by mouth every 6 (six) hours as needed for moderate pain or headache.  ? atorvastatin (LIPITOR) 40 MG tablet Take 1 tablet (40 mg  total) by mouth daily. PT NEEDS OV FOR FURTHER REFILLS  ? Coenzyme Q10 (COQ10) 200 MG CAPS Take 200 mg by mouth every evening.  ? ELIQUIS 5 MG TABS tablet TAKE 1 TABLET BY MOUTH TWICE A DAY  ? empagliflozin (JARDIANCE) 25 MG TABS tablet Take 1 tablet (25 mg total) by mouth daily before breakfast.  ? ezetimibe (ZETIA) 10 MG tablet Take 1 tablet (10 mg total) by mouth daily.  ? Melatonin 5 MG TABS Take 5 mg by mouth at bedtime.  ? metoprolol succinate (TOPROL-XL) 25 MG 24 hr tablet TAKE 1/2 TABLET BY MOUTH ONCE DAILY  ? sacubitril-valsartan (ENTRESTO) 49-51 MG Take 1 tablet by mouth 2 (two) times daily.  ?   ? ?Allergies:   Aldactone [spironolactone], Nsaids, Crestor [rosuvastatin calcium], and Penicillins  ? ?Social History  ? ?Socioeconomic History  ? Marital status: Married  ?  Spouse name: Not on file  ? Number of children: 2  ? Years of education: Not on file  ? Highest education level: Not on file  ?Occupational History  ? Occupation: retired  ?Tobacco Use  ? Smoking status: Former  ?  Packs/day: 2.00  ?  Years: 10.00  ?  Pack years: 20.00  ?  Types: Cigarettes  ?  Quit date: 03/17/1970  ?  Years since quitting: 51.2  ? Smokeless tobacco: Never  ?Vaping Use  ? Vaping Use: Never used  ?Substance and Sexual Activity  ? Alcohol use: No  ?  Comment: quit 1995  ? Drug use: No  ? Sexual activity: Yes  ?Other Topics Concern  ? Not on file  ?Social History Narrative  ? Lives in [place] with spouse.  2 Grown children.  Retired from [employer]  ? Married x 32 years in 2022.  ? ?Social Determinants of Health  ? ?Financial Resource Strain: Low Risk   ? Difficulty of Paying Living Expenses: Not hard at all  ?Food Insecurity: No Food Insecurity  ? Worried About Charity fundraiser in the Last Year: Never true  ? Ran Out of Food in the Last Year: Never true  ?Transportation Needs: No Transportation Needs  ? Lack of Transportation (Medical): No  ? Lack of Transportation (Non-Medical): No  ?Physical Activity: Sufficiently Active  ? Days of Exercise per Week: 5 days  ? Minutes of Exercise per Session: 60 min  ?Stress: No Stress Concern Present  ? Feeling of Stress : Not at all  ?Social Connections: Socially Integrated  ? Frequency of Communication with Friends and Family: More than three times a week  ? Frequency of Social Gatherings with Friends and Family: More than three times a week  ? Attends Religious Services: More than 4 times per year  ? Active Member of Clubs or Organizations: Yes  ? Attends Archivist Meetings: More than 4 times per year  ? Marital Status: Married  ?  ? ?Family History: ?The  patient's family history includes Brain cancer in his brother; Breast cancer in his mother; COPD in his father; Colon polyps in his brother; Tuberculosis in his brother and father; Ulcers in his father. There is no history of Colon cancer, Stomach cancer, Rectal cancer, Esophageal cancer, or Liver cancer. ? ?ROS:   ?Please see the history of present illness.    ?All other systems reviewed and are negative. ? ?EKGs/Labs/Other Studies Reviewed:   ? ?The following studies were reviewed today: ? ?Echo 11/15/2020: ? 1. Left ventricular ejection fraction, by estimation, is 35  to 40%. The  ?left ventricle has moderately decreased function. The left ventricle has  ?no regional wall motion abnormalities. Left ventricular diastolic function  ?could not be evaluated. Elevated  ?left ventricular end-diastolic pressure. There is akinesis of the left  ?ventricular, apical anterior wall, inferior wall and inferolateral wall.  ?There is akinesis of the left ventricular, apical segment. There is severe  ?hypokinesis of the left  ?ventricular, basal-mid anteroseptal wall. There is hypokinesis of the left  ?ventricular, basal-mid inferoseptal wall.  ? 2. Right ventricular systolic function is normal. The right ventricular  ?size is normal. There is normal pulmonary artery systolic pressure. The  ?estimated right ventricular systolic pressure is 41.2 mmHg.  ? 3. Left atrial size was severely dilated.  ? 4. Right atrial size was severely dilated.  ? 5. The mitral valve is normal in structure. Trivial mitral valve  ?regurgitation. No evidence of mitral stenosis.  ? 6. The aortic valve is tricuspid. Aortic valve regurgitation is trivial.  ?Mild aortic valve sclerosis is present, with no evidence of aortic valve  ?stenosis.  ? 7. Aortic dilatation noted. There is borderline dilatation of the aortic  ?root, measuring 37 mm. There is mild dilatation of the ascending aorta,  ?measuring 38 mm.  ? 8. The inferior vena cava is normal in size with  <50% respiratory  ?variability, suggesting right atrial pressure of 8 mmHg.  ? ?Comparison(s): Prior EF 30%.  ? ?Cardiac MRI 01/18/2020: ?ADDENDUM REPORT: 01/18/2020 15:41 ?  ?ADDENDUM: ?Correcting report: LVEF from th

## 2021-06-21 NOTE — Patient Instructions (Signed)
Medication Instructions:  Your physician recommends that you continue on your current medications as directed. Please refer to the Current Medication list given to you today. *If you need a refill on your cardiac medications before your next appointment, please call your pharmacy*  Lab Work: None. If you have labs (blood work) drawn today and your tests are completely normal, you will receive your results only by: MyChart Message (if you have MyChart) OR A paper copy in the mail If you have any lab test that is abnormal or we need to change your treatment, we will call you to review the results.  Testing/Procedures: None.  Follow-Up: At [facility], you and your health needs are our priority.  As part of our continuing mission to provide you with exceptional heart care, we have created designated Provider Care Teams.  These Care Teams include your primary Cardiologist (physician) and Advanced Practice Providers (APPs -  Physician Assistants and Nurse Practitioners) who all work together to provide you with the care you need, when you need it.  Your physician wants you to follow-up in: 12 months with Cameron Lambert, MD     You will receive a reminder letter in the mail two months in advance. If you don't receive a letter, please call our office to schedule the follow-up appointment.  We recommend signing up for the patient portal called "MyChart".  Sign up information is provided on this After Visit Summary.  MyChart is used to connect with patients for Virtual Visits (Telemedicine).  Patients are able to view lab/test results, encounter notes, upcoming appointments, etc.  Non-urgent messages can be sent to your provider as well.   To learn more about what you can do with MyChart, go to [email or web address].    Any Other Special Instructions Will Be Listed Below (If Applicable).         

## 2021-06-24 ENCOUNTER — Other Ambulatory Visit: Payer: Self-pay | Admitting: Internal Medicine

## 2021-06-24 NOTE — Telephone Encounter (Signed)
Eliquis 5 mg refill request received. Patient is 81 years old, weight- 79.7 kg, Crea- 1.4 on 11/27/20, Diagnosis- atrial flutter, and last seen by Dr. Quentin Ore on 06/21/21. Dose is appropriate based on dosing criteria. Will send in refill to requested pharmacy.   ?

## 2021-07-15 ENCOUNTER — Other Ambulatory Visit: Payer: Self-pay | Admitting: Family Medicine

## 2021-07-24 DIAGNOSIS — D492 Neoplasm of unspecified behavior of bone, soft tissue, and skin: Secondary | ICD-10-CM | POA: Diagnosis not present

## 2021-07-24 DIAGNOSIS — L570 Actinic keratosis: Secondary | ICD-10-CM | POA: Diagnosis not present

## 2021-07-24 DIAGNOSIS — L821 Other seborrheic keratosis: Secondary | ICD-10-CM | POA: Diagnosis not present

## 2021-07-24 DIAGNOSIS — L814 Other melanin hyperpigmentation: Secondary | ICD-10-CM | POA: Diagnosis not present

## 2021-07-24 DIAGNOSIS — C44529 Squamous cell carcinoma of skin of other part of trunk: Secondary | ICD-10-CM | POA: Diagnosis not present

## 2021-07-24 DIAGNOSIS — Z08 Encounter for follow-up examination after completed treatment for malignant neoplasm: Secondary | ICD-10-CM | POA: Diagnosis not present

## 2021-07-24 DIAGNOSIS — Z85820 Personal history of malignant melanoma of skin: Secondary | ICD-10-CM | POA: Diagnosis not present

## 2021-07-24 DIAGNOSIS — D225 Melanocytic nevi of trunk: Secondary | ICD-10-CM | POA: Diagnosis not present

## 2021-08-13 ENCOUNTER — Encounter: Payer: Self-pay | Admitting: Internal Medicine

## 2021-08-13 MED ORDER — ENTRESTO 49-51 MG PO TABS: 1.0000 | ORAL_TABLET | Freq: Two times a day (BID) | ORAL | 3 refills | Status: DC

## 2021-09-05 ENCOUNTER — Other Ambulatory Visit: Payer: Self-pay | Admitting: Family Medicine

## 2021-09-05 NOTE — Telephone Encounter (Signed)
Requested medication (s) are due for refill today: yes  Requested medication (s) are on the active medication list: yes  Last refill:  08/23/20 #90/3  Future visit scheduled: yes 09/26/21 at 1115  Notes to clinic: Unable to refill per protocol due to failed labs, no updated results.   Requested Prescriptions  Pending Prescriptions Disp Refills   ezetimibe (ZETIA) 10 MG tablet [Pharmacy Med Name: ezetimibe 10 mg tablet] 90 tablet 2    Sig: TAKE ONE TABLET BY MOUTH ONCE DAILY     Cardiovascular:  Antilipid - Sterol Transport Inhibitors Failed - 09/05/2021 12:51 PM      Failed - AST in normal range and within 360 days    AST  Date Value Ref Range Status  07/03/2020 20 10 - 35 U/L Final         Failed - ALT in normal range and within 360 days    ALT  Date Value Ref Range Status  07/03/2020 19 9 - 46 U/L Final         Failed - Valid encounter within last 12 months    Recent Outpatient Visits           1 year ago Coronary artery disease involving native coronary artery of native heart without angina pectoris   [place] Susy Frizzle, MD   1 year ago Hyperlipidemia, unspecified hyperlipidemia type   [place] Pickard, Cammie Mcgee, MD   2 years ago Hx-TIA (transient ischemic attack)   [place] Susy Frizzle, MD   3 years ago Left ventricular ejection fraction less than 43 percent   [place] Pickard, Cammie Mcgee, MD   3 years ago Subacute frontal sinusitis   [place] Pickard, Cammie Mcgee, MD       Future Appointments             In 3 weeks Pickard, Cammie Mcgee, MD [place]            Failed - Lipid Panel in normal range within the last 12 months    Cholesterol, Total  Date Value Ref Range Status  11/15/2019 138 100 - 199 mg/dL Final   Cholesterol  Date Value Ref Range Status  07/03/2020 131 <200 mg/dL Final   LDL Cholesterol (Calc)  Date Value Ref  Range Status  07/03/2020 59 mg/dL (calc) Final    Comment:    Reference range: <100 . Desirable range <100 mg/dL for primary prevention;   <70 mg/dL for patients with CHD or diabetic patients  with > or = 2 CHD risk factors. Marland Kitchen LDL-C is now calculated using the Martin-Hopkins  calculation, which is a validated novel method providing  better accuracy than the Friedewald equation in the  estimation of LDL-C.  [name] et al. Annamaria Helling. 2440;102(72): 2061-2068  (http://education.[facility].com/faq/FAQ164)    HDL  Date Value Ref Range Status  07/03/2020 57 > OR = 40 mg/dL Final  11/15/2019 63 >39 mg/dL Final   Triglycerides  Date Value Ref Range Status  07/03/2020 66 <150 mg/dL Final         Passed - Patient is not pregnant

## 2021-09-05 NOTE — Telephone Encounter (Signed)
Patient called, left VM to return the call to the office to scheduled an appt for medication refill request.   

## 2021-09-08 ENCOUNTER — Other Ambulatory Visit: Payer: Self-pay | Admitting: Family Medicine

## 2021-09-12 DIAGNOSIS — D045 Carcinoma in situ of skin of trunk: Secondary | ICD-10-CM | POA: Diagnosis not present

## 2021-09-23 ENCOUNTER — Other Ambulatory Visit: Payer: PPO

## 2021-09-24 ENCOUNTER — Other Ambulatory Visit: Payer: PPO

## 2021-09-24 DIAGNOSIS — E782 Mixed hyperlipidemia: Secondary | ICD-10-CM

## 2021-09-24 DIAGNOSIS — I951 Orthostatic hypotension: Secondary | ICD-10-CM

## 2021-09-24 DIAGNOSIS — E7800 Pure hypercholesterolemia, unspecified: Secondary | ICD-10-CM

## 2021-09-24 DIAGNOSIS — R5383 Other fatigue: Secondary | ICD-10-CM | POA: Diagnosis not present

## 2021-09-24 DIAGNOSIS — N401 Enlarged prostate with lower urinary tract symptoms: Secondary | ICD-10-CM | POA: Diagnosis not present

## 2021-09-25 LAB — COMPREHENSIVE METABOLIC PANEL
AG Ratio: 2.5 (calc) (ref 1.0–2.5)
ALT: 25 U/L (ref 9–46)
AST: 25 U/L (ref 10–35)
Albumin: 4.2 g/dL (ref 3.6–5.1)
Alkaline phosphatase (APISO): 26 U/L — ABNORMAL LOW (ref 35–144)
BUN/Creatinine Ratio: 24 (calc) — ABNORMAL HIGH (ref 6–22)
BUN: 34 mg/dL — ABNORMAL HIGH (ref 7–25)
CO2: 24 mmol/L (ref 20–32)
Calcium: 9.1 mg/dL (ref 8.6–10.3)
Chloride: 107 mmol/L (ref 98–110)
Creat: 1.44 mg/dL — ABNORMAL HIGH (ref 0.70–1.22)
Globulin: 1.7 g/dL (calc) — ABNORMAL LOW (ref 1.9–3.7)
Glucose, Bld: 85 mg/dL (ref 65–99)
Potassium: 4.7 mmol/L (ref 3.5–5.3)
Sodium: 139 mmol/L (ref 135–146)
Total Bilirubin: 0.7 mg/dL (ref 0.2–1.2)
Total Protein: 5.9 g/dL — ABNORMAL LOW (ref 6.1–8.1)

## 2021-09-25 LAB — CBC WITH DIFFERENTIAL/PLATELET
Absolute Monocytes: 696 cells/uL (ref 200–950)
Basophils Absolute: 52 cells/uL (ref 0–200)
Basophils Relative: 0.8 %
Eosinophils Absolute: 150 cells/uL (ref 15–500)
Eosinophils Relative: 2.3 %
HCT: 45.9 % (ref 38.5–50.0)
Hemoglobin: 14.9 g/dL (ref 13.2–17.1)
Lymphs Abs: 2386 cells/uL (ref 850–3900)
MCH: 31.1 pg (ref 27.0–33.0)
MCHC: 32.5 g/dL (ref 32.0–36.0)
MCV: 95.8 fL (ref 80.0–100.0)
MPV: 10.9 fL (ref 7.5–12.5)
Monocytes Relative: 10.7 %
Neutro Abs: 3218 cells/uL (ref 1500–7800)
Neutrophils Relative %: 49.5 %
Platelets: 152 10*3/uL (ref 140–400)
RBC: 4.79 10*6/uL (ref 4.20–5.80)
RDW: 12.5 % (ref 11.0–15.0)
Total Lymphocyte: 36.7 %
WBC: 6.5 10*3/uL (ref 3.8–10.8)

## 2021-09-25 LAB — LIPID PANEL
Cholesterol: 116 mg/dL (ref <200)
HDL: 57 mg/dL (ref >=40)
LDL Cholesterol (Calc): 46 mg/dL (calc)
Non-HDL Cholesterol (Calc): 59 mg/dL (calc) (ref <130)
Total CHOL/HDL Ratio: 2 (calc) (ref <5.0)
Triglycerides: 58 mg/dL (ref <150)

## 2021-09-25 LAB — TSH: TSH: 4.65 mIU/L — ABNORMAL HIGH (ref 0.40–4.50)

## 2021-09-25 LAB — PSA: PSA: 2.37 ng/mL (ref <=4.00)

## 2021-09-26 ENCOUNTER — Encounter: Payer: PPO | Admitting: Family Medicine

## 2021-09-26 ENCOUNTER — Telehealth: Payer: Self-pay

## 2021-09-26 NOTE — Telephone Encounter (Signed)
Pt came in late for his scheduled appt and had to reschedule until 8/8. Pt stated that he is now completely out of this med ezetimibe (ZETIA) 10 MG tablet and would like to know if he could get a courtesy refill to last him until his cpe appt. Please advise.  Cb#: [phone]

## 2021-09-27 ENCOUNTER — Other Ambulatory Visit: Payer: Self-pay | Admitting: Family Medicine

## 2021-09-27 MED ORDER — EZETIMIBE 10 MG PO TABS: 10.0000 mg | ORAL_TABLET | Freq: Every day | ORAL | 3 refills | Status: DC

## 2021-09-27 NOTE — Telephone Encounter (Signed)
Pt missed his appt yesterday, haven't been seen since 07/10/20.  Please advie

## 2021-09-30 ENCOUNTER — Other Ambulatory Visit: Payer: Self-pay

## 2021-09-30 MED ORDER — EZETIMIBE 10 MG PO TABS: 10.0000 mg | ORAL_TABLET | Freq: Every day | ORAL | 3 refills | Status: DC

## 2021-09-30 NOTE — Telephone Encounter (Signed)
Pt's wife called in about this prescription. Pt's wife stated that prescription was sent to wrong pharmacy. Pt would like this refill sent to [facility] for deliver. Please advise.  Cb#: [phone]

## 2021-10-12 ENCOUNTER — Other Ambulatory Visit: Payer: Self-pay | Admitting: Family Medicine

## 2021-10-14 NOTE — Telephone Encounter (Signed)
Requested Prescriptions  Pending Prescriptions Disp Refills  . atorvastatin (LIPITOR) 40 MG tablet [Pharmacy Med Name: ATORVASTATIN 40 MG TABLET] 30 tablet 0    Sig: TAKE 1 TABLET (40 MG TOTAL) BY MOUTH DAILY. PT NEEDS OV FOR FURTHER REFILLS     Cardiovascular:  Antilipid - Statins Failed - 10/12/2021 10:31 AM      Failed - Valid encounter within last 12 months    Recent Outpatient Visits          1 year ago Coronary artery disease involving native coronary artery of native heart without angina pectoris   [place] Pickard, Cammie Mcgee, MD   1 year ago Hyperlipidemia, unspecified hyperlipidemia type   [place] Dennard Schaumann, Cammie Mcgee, MD   2 years ago Hx-TIA (transient ischemic attack)   [place] Susy Frizzle, MD   3 years ago Left ventricular ejection fraction less than 62 percent   [place] Pickard, Cammie Mcgee, MD   3 years ago Subacute frontal sinusitis   [place] Pickard, Cammie Mcgee, MD      Future Appointments            In 1 week Denn[place] Schaumann, Cammie Mcgee, MD [place]           Failed - Lipid Panel in normal range within the last 12 months    Cholesterol, Total  Date Value Ref Range Status  11/15/2019 138 100 - 199 mg/dL Final   Cholesterol  Date Value Ref Range Status  09/24/2021 116 <200 mg/dL Final   LDL Cholesterol (Calc)  Date Value Ref Range Status  09/24/2021 46 mg/dL (calc) Final    Comment:    Reference range: <100 . Desirable range <100 mg/dL for primary prevention;   <70 mg/dL for patients with CHD or diabetic patients  with > or = 2 CHD risk factors. Marland Kitchen LDL-C is now calculated using the Martin-Hopkins  calculation, which is a validated novel method providing  better accuracy than the Friedewald equation in the  estimation of LDL-C.  [name] et al. Annamaria Helling. 8546;270(35): 2061-2068  (http://education.[facility].com/faq/FAQ164)    HDL   Date Value Ref Range Status  09/24/2021 57 > OR = 40 mg/dL Final  11/15/2019 63 >39 mg/dL Final   Triglycerides  Date Value Ref Range Status  09/24/2021 58 <150 mg/dL Final         Passed - Patient is not pregnant

## 2021-10-15 ENCOUNTER — Other Ambulatory Visit: Payer: Self-pay | Admitting: Family Medicine

## 2021-10-22 ENCOUNTER — Ambulatory Visit (INDEPENDENT_AMBULATORY_CARE_PROVIDER_SITE_OTHER): Payer: PPO | Admitting: Family Medicine

## 2021-10-22 VITALS — BP 118/62 | HR 51 | Temp 97.6°F | Ht 70.0 in | Wt 172.5 lb

## 2021-10-22 DIAGNOSIS — I2510 Atherosclerotic heart disease of native coronary artery without angina pectoris: Secondary | ICD-10-CM | POA: Diagnosis not present

## 2021-10-22 DIAGNOSIS — I10 Essential (primary) hypertension: Secondary | ICD-10-CM

## 2021-10-22 DIAGNOSIS — I5020 Unspecified systolic (congestive) heart failure: Secondary | ICD-10-CM | POA: Diagnosis not present

## 2021-10-22 DIAGNOSIS — E785 Hyperlipidemia, unspecified: Secondary | ICD-10-CM

## 2021-10-22 DIAGNOSIS — Z8673 Personal history of transient ischemic attack (TIA), and cerebral infarction without residual deficits: Secondary | ICD-10-CM | POA: Diagnosis not present

## 2021-10-22 DIAGNOSIS — Z0000 Encounter for general adult medical examination without abnormal findings: Secondary | ICD-10-CM

## 2021-10-22 MED ORDER — SILDENAFIL CITRATE 100 MG PO TABS: 50.0000 mg | ORAL_TABLET | Freq: Every day | ORAL | 11 refills | Status: DC | PRN

## 2021-10-22 NOTE — Progress Notes (Signed)
Subjective:    Patient ID: [name], male    DOB: Feb 21, 1941, 81 y.o.   MRN: [id number]   Patient is a very pleasant 81 year old Caucasian male with a history of atrial flutter status post ablation x2, nonobstructive coronary artery disease, and congestive heart failure with an ejection fraction of 35% on echocardiogram in 2020.  Repeat echo 8/21 showed EF of 30%.  Patient is here today for complete physical exam.  He denies any concerns.  He does report some erectile dysfunction and would like to try Viagra.  Otherwise he is doing well.  Specifically he denies any chest pain shortness of breath or dyspnea on exertion.  He is not taking nitroglycerin.  His most recent lab work is listed below.  Due to his age she does not require another colonoscopy.  He is due for a flu shot this fall.  We also discussed the RSV vaccine as well as recommendation for COVID vaccination. Lab on 09/24/2021  Component Date Value Ref Range Status   TSH 09/24/2021 4.65 (H)  0.40 - 4.50 mIU/L Final   WBC 09/24/2021 6.5  3.8 - 10.8 Thousand/uL Final   RBC 09/24/2021 4.79  4.20 - 5.80 Million/uL Final   Hemoglobin 09/24/2021 14.9  13.2 - 17.1 g/dL Final   HCT 09/24/2021 45.9  38.5 - 50.0 % Final   MCV 09/24/2021 95.8  80.0 - 100.0 fL Final   MCH 09/24/2021 31.1  27.0 - 33.0 pg Final   MCHC 09/24/2021 32.5  32.0 - 36.0 g/dL Final   RDW 09/24/2021 12.5  11.0 - 15.0 % Final   Platelets 09/24/2021 152  140 - 400 Thousand/uL Final   MPV 09/24/2021 10.9  7.5 - 12.5 fL Final   Neutro Abs 09/24/2021 3,218  1,500 - 7,800 cells/uL Final   Lymphs Abs 09/24/2021 2,386  850 - 3,900 cells/uL Final   Absolute Monocytes 09/24/2021 696  200 - 950 cells/uL Final   Eosinophils Absolute 09/24/2021 150  15 - 500 cells/uL Final   Basophils Absolute 09/24/2021 52  0 - 200 cells/uL Final   Neutrophils Relative % 09/24/2021 49.5  % Final   Total Lymphocyte 09/24/2021 36.7  % Final   Monocytes Relative 09/24/2021 10.7  % Final    Eosinophils Relative 09/24/2021 2.3  % Final   Basophils Relative 09/24/2021 0.8  % Final   Glucose, Bld 09/24/2021 85  65 - 99 mg/dL Final   Comment: .            Fasting reference interval .    BUN 09/24/2021 34 (H)  7 - 25 mg/dL Final   Creat 09/24/2021 1.44 (H)  0.70 - 1.22 mg/dL Final   BUN/Creatinine Ratio 09/24/2021 24 (H)  6 - 22 (calc) Final   Sodium 09/24/2021 139  135 - 146 mmol/L Final   Potassium 09/24/2021 4.7  3.5 - 5.3 mmol/L Final   Chloride 09/24/2021 107  98 - 110 mmol/L Final   CO2 09/24/2021 24  20 - 32 mmol/L Final   Calcium 09/24/2021 9.1  8.6 - 10.3 mg/dL Final   Total Protein 09/24/2021 5.9 (L)  6.1 - 8.1 g/dL Final   Albumin 09/24/2021 4.2  3.6 - 5.1 g/dL Final   Globulin 09/24/2021 1.7 (L)  1.9 - 3.7 g/dL (calc) Final   AG Ratio 09/24/2021 2.5  1.0 - 2.5 (calc) Final   Total Bilirubin 09/24/2021 0.7  0.2 - 1.2 mg/dL Final   Alkaline phosphatase (APISO) 09/24/2021 26 (L)  35 -  144 U/L Final   AST 09/24/2021 25  10 - 35 U/L Final   ALT 09/24/2021 25  9 - 46 U/L Final   Cholesterol 09/24/2021 116  <200 mg/dL Final   HDL 09/24/2021 57  > OR = 40 mg/dL Final   Triglycerides 09/24/2021 58  <150 mg/dL Final   LDL Cholesterol (Calc) 09/24/2021 46  mg/dL (calc) Final   Comment: Reference range: <100 . Desirable range <100 mg/dL for primary prevention;   <70 mg/dL for patients with CHD or diabetic patients  with > or = 2 CHD risk factors. Marland Kitchen LDL-C is now calculated using the Martin-Hopkins  calculation, which is a validated novel method providing  better accuracy than the Friedewald equation in the  estimation of LDL-C.  [name] et al. Annamaria Helling. 1610;960(45): 2061-2068  (http://education.[facility].com/faq/FAQ164)    Total CHOL/HDL Ratio 09/24/2021 2.0  <5.0 (calc) Final   Non-HDL Cholesterol (Calc) 09/24/2021 59  <130 mg/dL (calc) Final   Comment: For patients with diabetes plus 1 major ASCVD risk  factor, treating to a non-HDL-C goal of <100 mg/dL   (LDL-C of <70 mg/dL) is considered a therapeutic  option.    PSA 09/24/2021 2.37  < OR = 4.00 ng/mL Final   Comment: The total PSA value from this assay system is  standardized against the WHO standard. The test  result will be approximately 20% lower when compared  to the equimolar-standardized total PSA (Beckman  Coulter). Comparison of serial PSA results should be  interpreted with this fact in mind. . This test was performed using the Siemens  chemiluminescent method. Values obtained from  different assay methods cannot be used interchangeably. PSA levels, regardless of value, should not be interpreted as absolute evidence of the presence or absence of disease.     Past Medical History:  Diagnosis Date   Atrial flutter ([place])    a. s/p CTI ablation x2 by Dr Rayann Heman 10/12, 9/13   Basal cell carcinoma of cheek    BPH (benign prostatic hyperplasia)    CAD (coronary artery disease)    nonobstructive on cath 08/2018   CHF (congestive heart failure) ([place])    ef 35% on Echo 2020   CKD (chronic kidney disease) stage 3, GFR 30-59 ml/min (H[facility])    Colon polyps    Diverticulosis    Ectopic atrial tachycardia ([place])    a. Dx 07/2015: rate controlled    Hemorrhoids    Hyperlipidemia    Hypertension    Hypertrophic cardiomyopathy ([facility])    TIA (transient ischemic attack)    Past Surgical History:  Procedure Laterality Date   ATRIAL ABLATION SURGERY  10/12   CTI ablation for atrial flutter by Dr Rayann Heman   ATRIAL FLUTTER ABLATION N/A 12/11/2011   CTI ablation for atrial flutter by Dr Rayann Heman   BASAL CELL CARCINOMA EXCISION  ~ 1973   left cheek   CARDIAC CATHETERIZATION  1960's   CARDIOVERSION N/A 11/27/2020   Procedure: CARDIOVERSION;  Surgeon: Buford Dresser, MD;  Location: [place] [facility];  Service: Cardiovascular;  Laterality: N/A;   HERNIA REPAIR  ~ 4098   umbilical   JOINT REPLACEMENT     RIGHT/LEFT HEART CATH AND CORONARY ANGIOGRAPHY N/A 08/26/2018   Procedure: RIGHT/LEFT  HEART CATH AND CORONARY ANGIOGRAPHY;  Surgeon: Sherren Mocha, MD;  Location: [facility];  Service: Cardiovascular;  Laterality: N/A;   TONSILLECTOMY AND ADENOIDECTOMY  1947   TOTAL HIP ARTHROPLASTY     left   Current Outpatient Medications on File  Prior to Visit  Medication Sig Dispense Refill   acetaminophen (TYLENOL) 325 MG tablet Take 325-650 mg by mouth every 6 (six) hours as needed for moderate pain or headache.     apixaban (ELIQUIS) 5 MG TABS tablet TAKE ONE TABLET BY MOUTH TWICE DAILY 180 tablet 1   atorvastatin (LIPITOR) 40 MG tablet TAKE 1 TABLET (40 MG TOTAL) BY MOUTH DAILY. PT NEEDS OV FOR FURTHER REFILLS 30 tablet 0   Coenzyme Q10 (COQ10) 200 MG CAPS Take 200 mg by mouth every evening.     ezetimibe (ZETIA) 10 MG tablet Take 1 tablet (10 mg total) by mouth daily. 90 tablet 3   JARDIANCE 25 MG TABS tablet TAKE ONE TABLET BY MOUTH DAILY BEFORE BREAKFAST 90 tablet 0   Melatonin 5 MG TABS Take 5 mg by mouth at bedtime.     metoprolol succinate (TOPROL-XL) 25 MG 24 hr tablet TAKE 1/2 TABLET BY MOUTH ONCE DAILY 45 tablet 3   sacubitril-valsartan (ENTRESTO) 49-51 MG Take 1 tablet by mouth 2 (two) times daily. 180 tablet 3   No current facility-administered medications on file prior to visit.   Allergies  Allergen Reactions   Aldactone [Spironolactone]     Hyperkalemia   Nsaids Other (See Comments)    GI  Upset    Crestor [Rosuvastatin Calcium] Other (See Comments)    Joint pain   Penicillins Other (See Comments)    Unknown reaction Did it involve swelling of the face/tongue/throat, SOB, or low BP? Unknown Did it involve sudden or severe rash/hives, skin peeling, or any reaction on the inside of your mouth or nose? Unknown Did you need to seek medical attention at a hospital or doctor's office? Unknown When did it last happen? More than 50 years ago   If all above answers are "NO", may proceed with cephalosporin use.    Social History   Socioeconomic History    Marital status: Married    Spouse name: Not on file   Number of children: 2   Years of education: Not on file   Highest education level: Not on file  Occupational History   Occupation: retired  Tobacco Use   Smoking status: Former    Packs/day: 2.00    Years: 10.00    Total pack years: 20.00    Types: Cigarettes    Quit date: 03/17/1970    Years since quitting: 51.6   Smokeless tobacco: Never  Vaping Use   Vaping Use: Never used  Substance and Sexual Activity   Alcohol use: No    Comment: quit 1995   Drug use: No   Sexual activity: Yes  Other Topics Concern   Not on file  Social History Narrative   Lives in [place] with spouse.  2 Grown children.  Retired from [employer]   Married x 32 years in 2022.   Social Determinants of Health   Financial Resource Strain: Low Risk  (01/24/2021)   Overall Financial Resource Strain (CARDIA)    Difficulty of Paying Living Expenses: Not hard at all  Food Insecurity: No Food Insecurity (01/24/2021)   Hunger Vital Sign    Worried About Running Out of Food in the Last Year: Never true    Ran Out of Food in the Last Year: Never true  Transportation Needs: No Transportation Needs (01/24/2021)   PRAPARE - Hydrologist (Medical): No    Lack of Transportation (Non-Medical): No  Physical Activity: Sufficiently Active (01/24/2021)  Exercise Vital Sign    Days of Exercise per Week: 5 days    Minutes of Exercise per Session: 60 min  Stress: No Stress Concern Present (01/24/2021)   [place]    Feeling of Stress : Not at all  Social Connections: Socially Integrated (01/24/2021)   Social Connection and Isolation Panel [NHANES]    Frequency of Communication with Friends and Family: More than three times a week    Frequency of Social Gatherings with Friends and Family: More than three times a week    Attends Religious Services: More  than 4 times per year    Active Member of [employer] or Organizations: Yes    Attends Music therapist: More than 4 times per year    Marital Status: Married  Human resources officer Violence: Not At Risk (01/24/2021)   Humiliation, Afraid, Rape, and Kick questionnaire    Fear of Current or Ex-Partner: No    Emotionally Abused: No    Physically Abused: No    Sexually Abused: No   Family History  Problem Relation Age of Onset   Breast cancer Mother    Ulcers Father        stomach   COPD Father    Tuberculosis Father    Colon polyps Brother    Tuberculosis Brother    Brain cancer Brother    Colon cancer Neg Hx    Stomach cancer Neg Hx    Rectal cancer Neg Hx    Esophageal cancer Neg Hx    Liver cancer Neg Hx       Review of Systems  All other systems reviewed and are negative.      Objective:   Physical Exam Vitals reviewed.  Constitutional:      General: He is not in acute distress.    Appearance: He is well-developed. He is not diaphoretic.  HENT:     Head: Normocephalic and atraumatic.     Right Ear: External ear normal.     Left Ear: External ear normal.     Nose: Nose normal.     Mouth/Throat:     Pharynx: No oropharyngeal exudate.  Eyes:     General: No scleral icterus.       Right eye: No discharge.        Left eye: No discharge.     Conjunctiva/sclera: Conjunctivae normal.     Pupils: Pupils are equal, round, and reactive to light.  Neck:     Thyroid: No thyromegaly.     Vascular: No JVD.     Trachea: No tracheal deviation.  Cardiovascular:     Rate and Rhythm: Normal rate and regular rhythm.     Heart sounds: Normal heart sounds. No murmur heard.    No friction rub. No gallop.  Pulmonary:     Effort: Pulmonary effort is normal. No respiratory distress.     Breath sounds: Normal breath sounds. No stridor. No wheezing or rales.  Chest:     Chest wall: No tenderness.  Abdominal:     General: Bowel sounds are normal. There is no distension.      Palpations: Abdomen is soft. There is no mass.     Tenderness: There is no abdominal tenderness. There is no guarding or rebound.  Musculoskeletal:        General: No tenderness. Normal range of motion.     Cervical back: Normal range of motion and neck supple.  Lymphadenopathy:  Cervical: No cervical adenopathy.  Skin:    General: Skin is warm.     Coloration: Skin is not pale.     Findings: No erythema or rash.  Neurological:     Mental Status: He is alert and oriented to person, place, and time.     Cranial Nerves: No cranial nerve deficit.     Motor: No abnormal muscle tone.     Coordination: Coordination normal.     Deep Tendon Reflexes: Reflexes are normal and symmetric.  Psychiatric:        Behavior: Behavior normal.        Thought Content: Thought content normal.        Judgment: Judgment normal.      Lab on 09/24/2021  Component Date Value Ref Range Status   TSH 09/24/2021 4.65 (H)  0.40 - 4.50 mIU/L Final   WBC 09/24/2021 6.5  3.8 - 10.8 Thousand/uL Final   RBC 09/24/2021 4.79  4.20 - 5.80 Million/uL Final   Hemoglobin 09/24/2021 14.9  13.2 - 17.1 g/dL Final   HCT 09/24/2021 45.9  38.5 - 50.0 % Final   MCV 09/24/2021 95.8  80.0 - 100.0 fL Final   MCH 09/24/2021 31.1  27.0 - 33.0 pg Final   MCHC 09/24/2021 32.5  32.0 - 36.0 g/dL Final   RDW 09/24/2021 12.5  11.0 - 15.0 % Final   Platelets 09/24/2021 152  140 - 400 Thousand/uL Final   MPV 09/24/2021 10.9  7.5 - 12.5 fL Final   Neutro Abs 09/24/2021 3,218  1,500 - 7,800 cells/uL Final   Lymphs Abs 09/24/2021 2,386  850 - 3,900 cells/uL Final   Absolute Monocytes 09/24/2021 696  200 - 950 cells/uL Final   Eosinophils Absolute 09/24/2021 150  15 - 500 cells/uL Final   Basophils Absolute 09/24/2021 52  0 - 200 cells/uL Final   Neutrophils Relative % 09/24/2021 49.5  % Final   Total Lymphocyte 09/24/2021 36.7  % Final   Monocytes Relative 09/24/2021 10.7  % Final   Eosinophils Relative 09/24/2021 2.3  % Final    Basophils Relative 09/24/2021 0.8  % Final   Glucose, Bld 09/24/2021 85  65 - 99 mg/dL Final   Comment: .            Fasting reference interval .    BUN 09/24/2021 34 (H)  7 - 25 mg/dL Final   Creat 09/24/2021 1.44 (H)  0.70 - 1.22 mg/dL Final   BUN/Creatinine Ratio 09/24/2021 24 (H)  6 - 22 (calc) Final   Sodium 09/24/2021 139  135 - 146 mmol/L Final   Potassium 09/24/2021 4.7  3.5 - 5.3 mmol/L Final   Chloride 09/24/2021 107  98 - 110 mmol/L Final   CO2 09/24/2021 24  20 - 32 mmol/L Final   Calcium 09/24/2021 9.1  8.6 - 10.3 mg/dL Final   Total Protein 09/24/2021 5.9 (L)  6.1 - 8.1 g/dL Final   Albumin 09/24/2021 4.2  3.6 - 5.1 g/dL Final   Globulin 09/24/2021 1.7 (L)  1.9 - 3.7 g/dL (calc) Final   AG Ratio 09/24/2021 2.5  1.0 - 2.5 (calc) Final   Total Bilirubin 09/24/2021 0.7  0.2 - 1.2 mg/dL Final   Alkaline phosphatase (APISO) 09/24/2021 26 (L)  35 - 144 U/L Final   AST 09/24/2021 25  10 - 35 U/L Final   ALT 09/24/2021 25  9 - 46 U/L Final   Cholesterol 09/24/2021 116  <200 mg/dL Final   HDL 09/24/2021 57  >  OR = 40 mg/dL Final   Triglycerides 09/24/2021 58  <150 mg/dL Final   LDL Cholesterol (Calc) 09/24/2021 46  mg/dL (calc) Final   Comment: Reference range: <100 . Desirable range <100 mg/dL for primary prevention;   <70 mg/dL for patients with CHD or diabetic patients  with > or = 2 CHD risk factors. Marland Kitchen LDL-C is now calculated using the Martin-Hopkins  calculation, which is a validated novel method providing  better accuracy than the Friedewald equation in the  estimation of LDL-C.  [name] et al. Annamaria Helling. 7628;315(17): 2061-2068  (http://education.[facility].com/faq/FAQ164)    Total CHOL/HDL Ratio 09/24/2021 2.0  <5.0 (calc) Final   Non-HDL Cholesterol (Calc) 09/24/2021 59  <130 mg/dL (calc) Final   Comment: For patients with diabetes plus 1 major ASCVD risk  factor, treating to a non-HDL-C goal of <100 mg/dL  (LDL-C of <70 mg/dL) is considered a therapeutic   option.    PSA 09/24/2021 2.37  < OR = 4.00 ng/mL Final   Comment: The total PSA value from this assay system is  standardized against the WHO standard. The test  result will be approximately 20% lower when compared  to the equimolar-standardized total PSA (Beckman  Coulter). Comparison of serial PSA results should be  interpreted with this fact in mind. . This test was performed using the Siemens  chemiluminescent method. Values obtained from  different assay methods cannot be used interchangeably. PSA levels, regardless of value, should not be interpreted as absolute evidence of the presence or absence of disease.         Assessment & Plan:  Coronary artery disease involving native coronary artery of native heart without angina pectoris  Hx-TIA (transient ischemic attack)  Encounter for Medicare annual wellness exam  Benign essential HTN  HFrEF (heart failure with reduced ejection fraction) ([place])  Hyperlipidemia, unspecified hyperlipidemia type I am very happy with the patient's lab work.  He does have subclinical hypothyroidism.  I recommended repeating that in 6 months but at the present time it does not require treatment.  His blood pressure is excellent.  He does have some chronic kidney disease however at the present time his creatinine is stable and I recommended that we just monitor that every 6 months.  His heart failure is well managed symptomatically by his cardiologist and he feels quite well.  We will do Viagra as needed for erectile dysfunction.  Also recommended a flu shot as well as the RSV vaccine

## 2021-11-12 NOTE — Progress Notes (Unsigned)
Cardiology Office Note:    Date:  11/13/2021   ID:  [name], DOB 1940-08-09, MRN [id number]  PCP:  Susy Frizzle, MD  [facility] Cardiologist: Rudean Haskell MD [place] Electrophysiologist:  Vickie Epley, MD   CC:  Follow up HFrEF  History of Present Illness:    [name] is a 81 y.o. male with a hx of Atrial flutter s/p multiple CT ablations last in 2013 (6+), AT, Suspicion for Obstructive CAD with LCP in 08/2018 and scar on MRI, Hx of HTN with  HLD , prior TIA. CKD stage III who presented for evaluation for HCM eval 12/22/19.  2021 In interim from this visit had Cardiac MRI for evaluation of etiology of HF:  Found to have disease consistent with prior LAD infarct and improvement of LVEF. Diagnostic not met for HCM. 2022:  In interim from 11/9 saw Dr. Rayann Heman with no changes need and got paperwork done for [facility].   2023: planned for conservative AF approach  Patient notes that he is doing well- playing golf and having no symptoms. Since last visit notes that he is still working at [employer] has improved.. There are no interval hospital/ED visit.    No chest pain or pressure .  No SOB/DOE and no PND/Orthopnea.  No weight gain or leg swelling.  No palpitations or syncope.   Past Medical History:  Diagnosis Date   Atrial flutter ([place])    a. s/p CTI ablation x2 by Dr Rayann Heman 10/12, 9/13   Basal cell carcinoma of cheek    BPH (benign prostatic hyperplasia)    CAD (coronary artery disease)    nonobstructive on cath 08/2018   CHF (congestive heart failure) ([place])    ef 35% on Echo 2020   CKD (chronic kidney disease) stage 3, GFR 30-59 ml/min (HCC)    Colon polyps    Diverticulosis    Ectopic atrial tachycardia ([place])    a. Dx 07/2015: rate controlled    Hemorrhoids    Hyperlipidemia    Hypertension    Hypertrophic cardiomyopathy (HCC)    TIA (transient ischemic attack)     Past Surgical History:  Procedure Laterality Date   ATRIAL  ABLATION SURGERY  10/12   CTI ablation for atrial flutter by Dr Rayann Heman   ATRIAL FLUTTER ABLATION N/A 12/11/2011   CTI ablation for atrial flutter by Dr Rayann Heman   BASAL CELL CARCINOMA EXCISION  ~ 1973   left cheek   CARDIAC CATHETERIZATION  1960's   CARDIOVERSION N/A 11/27/2020   Procedure: CARDIOVERSION;  Surgeon: Buford Dresser, MD;  Location: [facility];  Service: Cardiovascular;  Laterality: N/A;   HERNIA REPAIR  ~ 6834   umbilical   JOINT REPLACEMENT     RIGHT/LEFT HEART CATH AND CORONARY ANGIOGRAPHY N/A 08/26/2018   Procedure: RIGHT/LEFT HEART CATH AND CORONARY ANGIOGRAPHY;  Surgeon: Sherren Mocha, MD;  Location: [facility];  Service: Cardiovascular;  Laterality: N/A;   TONSILLECTOMY AND ADENOIDECTOMY  1947   TOTAL HIP ARTHROPLASTY     left   Current Medications: Current Meds  Medication Sig   acetaminophen (TYLENOL) 325 MG tablet Take 325-650 mg by mouth every 6 (six) hours as needed for moderate pain or headache.   apixaban (ELIQUIS) 5 MG TABS tablet TAKE ONE TABLET BY MOUTH TWICE DAILY   atorvastatin (LIPITOR) 40 MG tablet TAKE 1 TABLET (40 MG TOTAL) BY MOUTH DAILY. PT NEEDS OV FOR FURTHER REFILLS   Coenzyme Q10 (COQ10) 200  MG CAPS Take 200 mg by mouth every evening.   ezetimibe (ZETIA) 10 MG tablet Take 1 tablet (10 mg total) by mouth daily.   JARDIANCE 25 MG TABS tablet TAKE ONE TABLET BY MOUTH DAILY BEFORE BREAKFAST   Melatonin 5 MG TABS Take 5 mg by mouth at bedtime.   metoprolol succinate (TOPROL-XL) 25 MG 24 hr tablet TAKE 1/2 TABLET BY MOUTH ONCE DAILY   sacubitril-valsartan (ENTRESTO) 49-51 MG Take 1 tablet by mouth 2 (two) times daily.   sildenafil (VIAGRA) 100 MG tablet Take 0.5-1 tablets (50-100 mg total) by mouth daily as needed for erectile dysfunction.    Allergies:   Aldactone [spironolactone], Nsaids, Crestor [rosuvastatin calcium], and Penicillins   Social History   Socioeconomic History   Marital status: Married    Spouse name: Not on  file   Number of children: 2   Years of education: Not on file   Highest education level: Not on file  Occupational History   Occupation: retired  Tobacco Use   Smoking status: Former    Packs/day: 2.00    Years: 10.00    Total pack years: 20.00    Types: Cigarettes    Quit date: 03/17/1970    Years since quitting: 51.6   Smokeless tobacco: Never  Vaping Use   Vaping Use: Never used  Substance and Sexual Activity   Alcohol use: No    Comment: quit 1995   Drug use: No   Sexual activity: Yes  Other Topics Concern   Not on file  Social History Narrative   Lives in [place] with spouse.  2 Grown children.  Retired from [employer]   Married x 32 years in 2022.   Social Determinants of Health   Financial Resource Strain: Low Risk  (01/24/2021)   Overall Financial Resource Strain (CARDIA)    Difficulty of Paying Living Expenses: Not hard at all  Food Insecurity: No Food Insecurity (01/24/2021)   Hunger Vital Sign    Worried About Running Out of Food in the Last Year: Never true    Ran Out of Food in the Last Year: Never true  Transportation Needs: No Transportation Needs (01/24/2021)   PRAPARE - Hydrologist (Medical): No    Lack of Transportation (Non-Medical): No  Physical Activity: Sufficiently Active (01/24/2021)   Exercise Vital Sign    Days of Exercise per Week: 5 days    Minutes of Exercise per Session: 60 min  Stress: No Stress Concern Present (01/24/2021)   [place]    Feeling of Stress : Not at all  Social Connections: [place] (01/24/2021)   Social Connection and Isolation Panel [NHANES]    Frequency of Communication with Friends and Family: More than three times a week    Frequency of Social Gatherings with Friends and Family: More than three times a week    Attends Religious Services: More than 4 times per year    Active Member of [employer]  or Organizations: Yes    Attends Music therapist: More than 4 times per year    Marital Status: Married    SOCIAL:  Going back to [employer] and doing treadmill and [employer], Married, 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 There is no history of Colon cancer, Stomach cancer, Rectal cancer, Esophageal cancer, or Liver cancer.  ROS:   Please see the history of present illness.    All other systems reviewed and are negative.  EKGs/Labs/Other Studies Reviewed:    The following studies were reviewed today:  EKG: 04/25/21: AFL 60 RBBB 10/29/20: Atrial Flutter 51 with slow ventricular response RBBB  Cardiac MRI: Date: 01/18/20 Results: Normal left ventricular size, thickness and systolic function is (LVEF =43%). There is anteroseptal and inferoseptal (base) hypokinesis with apical hypokinesis. There is no evidence of LVOT obstruction; supine study. There is late gadolinium enhancement in the left ventricular myocardium in the anteroseptal base, and in the anterior, anteroseptal, inferoseptal, and inferior mid segments. There is apical, transmural late gadolinium enhancement.  Transthoracic Echocardiogram: Date: 11/15/20 Results:   1. Left ventricular ejection fraction, by estimation, is 35 to 40%. The  left ventricle has moderately decreased function. The left ventricle has  no regional wall motion abnormalities. Left ventricular diastolic function  could not be evaluated. Elevated  left ventricular end-diastolic pressure. There is akinesis of the left  ventricular, apical anterior wall, inferior wall and inferolateral wall.  There is akinesis of the left ventricular, apical segment. There is severe  hypokinesis of the left  ventricular,  basal-mid anteroseptal wall. There is hypokinesis of the left  ventricular, basal-mid inferoseptal wall.   2. Right ventricular systolic function is normal. The right ventricular  size is normal. There is normal pulmonary artery systolic pressure. The  estimated right ventricular systolic pressure is 36.6 mmHg.   3. Left atrial size was severely dilated.   4. Right atrial size was severely dilated.   5. The mitral valve is normal in structure. Trivial mitral valve  regurgitation. No evidence of mitral stenosis.   6. The aortic valve is tricuspid. Aortic valve regurgitation is trivial.  Mild aortic valve sclerosis is present, with no evidence of aortic valve  stenosis.   7. Aortic dilatation noted. There is borderline dilatation of the aortic  root, measuring 37 mm. There is mild dilatation of the ascending aorta,  measuring 38 mm.   8. The inferior vena cava is normal in size with <50% respiratory  variability, suggesting right atrial pressure of 8 mmHg.   Nuclear Medicine Stress Test:  05/21/18:   No exercise associated VT.  Large anterior perfusion defect.  Normal exercise response  NM Stress Testing : Date: 11/24/19 Results: 12/14/19 Conclusion  Visual quantitative assessment is Grade 2 (suggestive of amyloid), however -- H/CL ratio is 1.3  3) Equivocal for TTR amyloidosis (Equivocal: A semi-quantitative visual score of 1 or H/CL ratio 1-1.5)**  Consider cMRI to further evaluate for TTR amyloid  Left/Right Heart Catheterizations: Date: 08/26/2018 Results: 1.  Nonobstructive coronary artery disease with patency of the left main stem, patency of the LAD with minor nonobstructive disease, patency of the circumflex/intermediate, severe stenosis at the ostium of the small distal diagonal branch, and mild nonobstructive disease of a large, dominant RCA 2.  Normal intracardiac hemodynamics in both the right and left heart with normal/low intracardiac filling pressures suggestive of well  compensated chronic systolic heart failure  Recent Labs: 09/24/2021: ALT 25; BUN 34; Creat 1.44; Hemoglobin 14.9; Platelets 152; Potassium 4.7; Sodium 139; TSH 4.65  Recent Lipid Panel    Component Value Date/Time   CHOL 116 09/24/2021 0818   CHOL 138 11/15/2019 0922   TRIG 58 09/24/2021 0818   HDL 57 09/24/2021  0818   HDL 63 11/15/2019 0922   CHOLHDL 2.0 09/24/2021 0818   VLDL 19 11/03/2016 0841   LDLCALC 46 09/24/2021 0818    Physical Exam:    VS:  BP 110/60   Pulse (!) 57   Ht 5' 10" (1.778 m)   Wt 78 kg   SpO2 98%   BMI 24.68 kg/m     Wt Readings from Last 3 Encounters:  11/13/21 78 kg  10/22/21 78.2 kg  06/21/21 79.7 kg   Gen: No distress  Neck: No JVD Ears: Bilateral Pilar Plate Sign Cardiac: No Rubs or Gallops, no murmur, RRR +2 radial pulses Respiratory: Clear to auscultation bilaterally, normal effort, normal respiratory rate GI: Soft, nontender, non-distended  MS: No edema;  moves all extremities Integument: Skin feels warm Neuro:  At time of evaluation, alert and oriented to person/place/time/situation  Psych: Normal affect, patient feels well   ASSESSMENT:    1. Atypical atrial flutter (HCC)   2. HFrEF (heart failure with reduced ejection fraction) ([place])   3. Coronary artery disease involving native coronary artery of native heart without angina pectoris     PLAN:    Atrial Flutter and AT - CHADSVASC 6 on DOAC - saw EP planned for conservative therapy; in rhythm today - if new SOB/DOE will get EKG and see for potentially DCCV  Systolic  Heart Failure with Reduced Ejection Fraction HTN CKD Stage III - NYHA class I, Stage B, euvolemic, etiology from septal MI - presently, no ICD or CRT indication - Euvolemic- - On metoprolol succinate 12.5 mg PO Daily  max tolerated dose - ARNI 49-51 max dose tolerated  - Hyperkalemia with aldactone - SGLT2i 25 mg PO Daily ([place])   Coronary Artery Disease; Obstructive septal perforator - asymptomatic -  continue DOAC (no ASA) - continue statin and zetia, goal LDL < 55; at goal 2023 - continue BB  Prior diagnosis of borderline aortic dilation  MRI- AA 33 mm.  Conservative monitoring at this time  One year me or APP  Medication Adjustments/Labs and Tests Ordered: Current medicines are reviewed at length with the patient today.  Concerns regarding medicines are outlined above.  No orders of the defined types were placed in this encounter.   No orders of the defined types were placed in this encounter.    Patient Instructions  Medication Instructions:  NO CHANGES *If you need a refill on your cardiac medications before your next appointment, please call your pharmacy*   Lab Work: NONE If you have labs (blood work) drawn today and your tests are completely normal, you will receive your results only by: [place] (if you have MyChart) OR A paper copy in the mail If you have any lab test that is abnormal or we need to change your treatment, we will call you to review the results.   Testing/Procedures: NONE   Follow-Up: At [facility], you and your health needs are our priority.  As part of our continuing mission to provide you with exceptional heart care, we have created designated Provider Care Teams.  These Care Teams include your primary Cardiologist (physician) and Advanced Practice Providers (APPs -  Physician Assistants and Nurse Practitioners) who all work together to provide you with the care you need, when you need it.  We recommend signing up for the patient portal called "MyChart".  Sign up information is provided on this After Visit Summary.  MyChart is used to connect with patients for Virtual Visits (Telemedicine).  Patients  are able to view lab/test results, encounter notes, upcoming appointments, etc.  Non-urgent messages can be sent to your provider as well.   To learn more about what you can do with MyChart, go to [email or web address].    Your  next appointment:   1 year(s)  The format for your next appointment:   In Person  Provider:   DR Gasper Sells    Other Instructions NONE  Important Information About Sugar         Signed, Werner Lean, MD  11/13/2021 9:30 AM    [place]

## 2021-11-13 ENCOUNTER — Ambulatory Visit: Payer: PPO | Attending: Internal Medicine | Admitting: Internal Medicine

## 2021-11-13 ENCOUNTER — Other Ambulatory Visit: Payer: Self-pay | Admitting: Family Medicine

## 2021-11-13 ENCOUNTER — Encounter: Payer: Self-pay | Admitting: Internal Medicine

## 2021-11-13 VITALS — BP 110/60 | HR 57 | Ht 70.0 in | Wt 172.0 lb

## 2021-11-13 DIAGNOSIS — I5020 Unspecified systolic (congestive) heart failure: Secondary | ICD-10-CM | POA: Diagnosis not present

## 2021-11-13 DIAGNOSIS — I484 Atypical atrial flutter: Secondary | ICD-10-CM | POA: Diagnosis not present

## 2021-11-13 DIAGNOSIS — I2510 Atherosclerotic heart disease of native coronary artery without angina pectoris: Secondary | ICD-10-CM | POA: Diagnosis not present

## 2021-11-13 NOTE — Patient Instructions (Signed)
Medication Instructions:  NO CHANGES *If you need a refill on your cardiac medications before your next appointment, please call your pharmacy*   Lab Work: NONE If you have labs (blood work) drawn today and your tests are completely normal, you will receive your results only by: [place] (if you have MyChart) OR A paper copy in the mail If you have any lab test that is abnormal or we need to change your treatment, we will call you to review the results.   Testing/Procedures: NONE   Follow-Up: At [facility], you and your health needs are our priority.  As part of our continuing mission to provide you with exceptional heart care, we have created designated Provider Care Teams.  These Care Teams include your primary Cardiologist (physician) and Advanced Practice Providers (APPs -  Physician Assistants and Nurse Practitioners) who all work together to provide you with the care you need, when you need it.  We recommend signing up for the patient portal called "MyChart".  Sign up information is provided on this After Visit Summary.  MyChart is used to connect with patients for Virtual Visits (Telemedicine).  Patients are able to view lab/test results, encounter notes, upcoming appointments, etc.  Non-urgent messages can be sent to your provider as well.   To learn more about what you can do with MyChart, go to [email or web address].    Your next appointment:   1 year(s)  The format for your next appointment:   In Person  Provider:   DR Gasper Sells    Other Instructions NONE  Important Information About Sugar

## 2021-11-13 NOTE — Telephone Encounter (Signed)
Requested Prescriptions  Pending Prescriptions Disp Refills  . atorvastatin (LIPITOR) 40 MG tablet [Pharmacy Med Name: ATORVASTATIN 40 MG TABLET] 30 tablet 3    Sig: TAKE 1 TABLET (40 MG TOTAL) BY MOUTH DAILY. PT NEEDS OV FOR FURTHER REFILLS     Cardiovascular:  Antilipid - Statins Failed - 11/13/2021  8:32 AM      Failed - Valid encounter within last 12 months    Recent Outpatient Visits          1 year ago Coronary artery disease involving native coronary artery of native heart without angina pectoris   [place] Pickard, Cammie Mcgee, MD   1 year ago Hyperlipidemia, unspecified hyperlipidemia type   [place] Dennard Schaumann, Cammie Mcgee, MD   2 years ago Hx-TIA (transient ischemic attack)   [place] Susy Frizzle, MD   3 years ago Left ventricular ejection fraction less than 18 percent   [place] Pick[place], Cammie Mcgee, MD   3 years ago Subacute frontal sinusitis   [place] Pickard, Cammie Mcgee, MD             Failed - Lipid Panel in normal range within the last 12 months    Cholesterol, Total  Date Value Ref Range Status  11/15/2019 138 100 - 199 mg/dL Final   Cholesterol  Date Value Ref Range Status  09/24/2021 116 <200 mg/dL Final   LDL Cholesterol (Calc)  Date Value Ref Range Status  09/24/2021 46 mg/dL (calc) Final    Comment:    Reference range: <100 . Desirable range <100 mg/dL for primary prevention;   <70 mg/dL for patients with CHD or diabetic patients  with > or = 2 CHD risk factors. Marland Kitchen LDL-C is now calculated using the Martin-Hopkins  calculation, which is a validated novel method providing  better accuracy than the Friedewald equation in the  estimation of LDL-C.  [name] et al. Annamaria Helling. 7510;258(52): 2061-2068  (http://education.QuestDiagnostics.com/faq/FAQ164)    HDL  Date Value Ref Range Status  09/24/2021 57 > OR = 40 mg/dL Final  11/15/2019 63 >39 mg/dL Final    Triglycerides  Date Value Ref Range Status  09/24/2021 58 <150 mg/dL Final         Passed - Patient is not pregnant

## 2021-11-21 DIAGNOSIS — D225 Melanocytic nevi of trunk: Secondary | ICD-10-CM | POA: Diagnosis not present

## 2021-11-21 DIAGNOSIS — Z85828 Personal history of other malignant neoplasm of skin: Secondary | ICD-10-CM | POA: Diagnosis not present

## 2021-11-21 DIAGNOSIS — L570 Actinic keratosis: Secondary | ICD-10-CM | POA: Diagnosis not present

## 2021-11-21 DIAGNOSIS — L821 Other seborrheic keratosis: Secondary | ICD-10-CM | POA: Diagnosis not present

## 2021-11-21 DIAGNOSIS — Z08 Encounter for follow-up examination after completed treatment for malignant neoplasm: Secondary | ICD-10-CM | POA: Diagnosis not present

## 2021-11-21 DIAGNOSIS — Z85820 Personal history of malignant melanoma of skin: Secondary | ICD-10-CM | POA: Diagnosis not present

## 2021-11-21 DIAGNOSIS — L814 Other melanin hyperpigmentation: Secondary | ICD-10-CM | POA: Diagnosis not present

## 2021-12-09 ENCOUNTER — Other Ambulatory Visit: Payer: Self-pay | Admitting: Cardiology

## 2021-12-09 DIAGNOSIS — I484 Atypical atrial flutter: Secondary | ICD-10-CM

## 2021-12-09 NOTE — Telephone Encounter (Signed)
Eliquis '5mg'$  refill request received. Patient is 81 years old, weight-78kg, Crea-1.44 on 09/24/2021, Diagnosis-Atrial flutter, and last seen by Dr. Gasper Sells on 11/13/2021. Dose is appropriate based on dosing criteria. Will send in refill to requested pharmacy.

## 2021-12-19 DIAGNOSIS — H02831 Dermatochalasis of right upper eyelid: Secondary | ICD-10-CM | POA: Diagnosis not present

## 2021-12-19 DIAGNOSIS — H02834 Dermatochalasis of left upper eyelid: Secondary | ICD-10-CM | POA: Diagnosis not present

## 2021-12-19 DIAGNOSIS — H5203 Hypermetropia, bilateral: Secondary | ICD-10-CM | POA: Diagnosis not present

## 2021-12-19 DIAGNOSIS — H52203 Unspecified astigmatism, bilateral: Secondary | ICD-10-CM | POA: Diagnosis not present

## 2021-12-19 DIAGNOSIS — H40023 Open angle with borderline findings, high risk, bilateral: Secondary | ICD-10-CM | POA: Diagnosis not present

## 2022-01-02 DIAGNOSIS — L821 Other seborrheic keratosis: Secondary | ICD-10-CM | POA: Diagnosis not present

## 2022-01-02 DIAGNOSIS — D492 Neoplasm of unspecified behavior of bone, soft tissue, and skin: Secondary | ICD-10-CM | POA: Diagnosis not present

## 2022-01-13 ENCOUNTER — Other Ambulatory Visit: Payer: Self-pay | Admitting: Family Medicine

## 2022-01-14 ENCOUNTER — Other Ambulatory Visit: Payer: Self-pay

## 2022-01-14 DIAGNOSIS — I5022 Chronic systolic (congestive) heart failure: Secondary | ICD-10-CM

## 2022-01-14 MED ORDER — EMPAGLIFLOZIN 25 MG PO TABS: ORAL_TABLET | ORAL | 0 refills | Status: DC

## 2022-01-14 NOTE — Telephone Encounter (Signed)
Patient called to check on status of refill request for Jardiance; stated he will be out of medication in a few days.   Please advise at [phone].

## 2022-01-14 NOTE — Telephone Encounter (Signed)
Requested medication (s) are due for refill today: Due 11/16/21  Requested medication (s) are on the active medication list: yes    Last refill: 10/16/21  #90  0 refills  Future visit scheduled With nurse, 01/1022  Notes to clinic:Failed due to labs (A1C) please review. Thank you.  Requested Prescriptions  Pending Prescriptions Disp Refills   JARDIANCE 25 MG TABS tablet [Pharmacy Med Name: Jardiance 25 mg tablet] 90 tablet 0    Sig: TAKE ONE TABLET BY MOUTH BEFORE BREAKFAST     Endocrinology:  Diabetes - SGLT2 Inhibitors Failed - 01/13/2022  7:33 AM      Failed - Cr in normal range and within 360 days    Creat  Date Value Ref Range Status  09/24/2021 1.44 (H) 0.70 - 1.22 mg/dL Final         Failed - HBA1C is between 0 and 7.9 and within 180 days    Hgb A1c MFr Bld  Date Value Ref Range Status  12/03/2013 6.1 (H) <5.7 % Final    Comment:    (NOTE)                                                                       According to the ADA Clinical Practice Recommendations for 2011, when HbA1c is used as a screening test:  >=6.5%   Diagnostic of Diabetes Mellitus           (if abnormal result is confirmed) 5.7-6.4%   Increased risk of developing Diabetes Mellitus References:Diagnosis and Classification of Diabetes Mellitus,Diabetes WUJW,1191,47(WGNFA 1):S62-S69 and Standards of Medical Care in         Diabetes - 2011,Diabetes Care,2011,34 (Suppl 1):S11-S61.         Failed - eGFR in normal range and within 360 days    GFR, Est African American  Date Value Ref Range Status  07/03/2020 66 > OR = 60 mL/min/1.38m Final   GFR, Est Non African American  Date Value Ref Range Status  07/03/2020 57 (L) > OR = 60 mL/min/1.732mFinal   GFR  Date Value Ref Range Status  04/25/2013 64.31 >60.00 mL/min Final         Failed - Valid encounter within last 6 months    Recent Outpatient Visits           1 year ago Coronary artery disease involving native coronary artery of native heart  without angina pectoris   [place] Fri[place]   2 years ago Hyperlipidemia, unspecified hyperlipidemia type   [place]   2 years ago Hx-TIA (transient ischemic attack)   [place]   3 years ago Left ventricular ejection fraction less than 4057ercent   [place]ammie McgeeMD   3 years ago Subacute frontal sinusitis   [place]

## 2022-01-15 ENCOUNTER — Telehealth: Payer: Self-pay | Admitting: Family Medicine

## 2022-01-15 NOTE — Telephone Encounter (Signed)
NHA out of office 11/8 thru 01/24/22.

## 2022-01-15 NOTE — Telephone Encounter (Signed)
Called patient to reschedule Medicare AWV appt; NHA out of office on 01/22/22. No answer/no voicemail set up.

## 2022-02-13 ENCOUNTER — Other Ambulatory Visit: Payer: Self-pay | Admitting: Family Medicine

## 2022-04-03 ENCOUNTER — Other Ambulatory Visit: Payer: Self-pay | Admitting: Internal Medicine

## 2022-04-04 ENCOUNTER — Other Ambulatory Visit: Payer: Self-pay | Admitting: Internal Medicine

## 2022-04-04 DIAGNOSIS — I484 Atypical atrial flutter: Secondary | ICD-10-CM

## 2022-04-04 NOTE — Telephone Encounter (Signed)
Eliquis '5mg'$  refill request received. Patient is 82 years old, weight-78kg, Crea-1.44 on 09/24/2021, Diagnosis-Aflutter, and last seen by Dr. Gasper Sells on 11/13/21. Dose is appropriate based on dosing criteria. Will send in refill to requested pharmacy.

## 2022-04-16 ENCOUNTER — Other Ambulatory Visit: Payer: Self-pay

## 2022-04-16 DIAGNOSIS — I2510 Atherosclerotic heart disease of native coronary artery without angina pectoris: Secondary | ICD-10-CM

## 2022-04-16 DIAGNOSIS — I5022 Chronic systolic (congestive) heart failure: Secondary | ICD-10-CM

## 2022-04-16 DIAGNOSIS — I10 Essential (primary) hypertension: Secondary | ICD-10-CM

## 2022-04-16 DIAGNOSIS — I5020 Unspecified systolic (congestive) heart failure: Secondary | ICD-10-CM

## 2022-04-24 ENCOUNTER — Other Ambulatory Visit: Payer: PPO

## 2022-04-25 ENCOUNTER — Other Ambulatory Visit: Payer: Self-pay

## 2022-04-25 DIAGNOSIS — Z79899 Other long term (current) drug therapy: Secondary | ICD-10-CM

## 2022-04-28 ENCOUNTER — Telehealth: Payer: Self-pay | Admitting: Pharmacist

## 2022-04-28 NOTE — Progress Notes (Unsigned)
Care Management & Coordination Services Pharmacy Team  Reason for Encounter: Appointment Reminder  Contacted patient to confirm in office appointment with Leata Mouse, PharmD on 04/30/22 at 9:00AM. {US HC Outreach:[id number]}  Do you have any problems getting your medications? {yes/no:20286} If yes what types of problems are you experiencing? {Problems:27223}  What is your top health concern you would like to discuss at your upcoming visit?   Have you seen any other providers since your last visit with PCP? {yes/no:20286}   Chart review:  Recent office visits:  ***  Recent consult visits:  11/21/21 Cindie Crumbly MD - Melanocytic nevi of trunk  - Dermatology - No notes available.   11/13/21 Rudean Haskell MD - Cardiology - A fib - No medication changes. Follow up in 1 year.    Hospital visits:  None in previous 6 months   Star Rating Drugs:  Medication:  Last Fill: Day Supply   Care Gaps: Annual wellness visit in last year? No Last done 01/24/21  If Diabetic: Last eye exam / retinopathy screening: Last diabetic foot exam:   Future Appointments  Date Time Provider [place]  04/30/2022  9:00 AM Edythe Clarity, [place], [place]

## 2022-04-30 ENCOUNTER — Encounter: Payer: Self-pay | Admitting: Family Medicine

## 2022-04-30 ENCOUNTER — Telehealth: Payer: Self-pay

## 2022-04-30 ENCOUNTER — Ambulatory Visit: Payer: PPO | Admitting: Pharmacist

## 2022-04-30 NOTE — Progress Notes (Signed)
Care Management & Coordination Services Pharmacy Note  04/30/2022 Name:  [name] MRN:  [id number] DOB:  1940-07-29  Summary: Initial visit with PharmD.  Doing very well no concerns with his medications other than a pricing issue with the pharmacy since the new year.  Denies sweling, SOB.  Ldl is well controlled  Recommendations/Changes made from today's visit: No changes will FU with pharmacy  Follow up plan: FU 1 year   Subjective: [name] is an 82 y.o. year old male who is a primary patient of Pickard, Cammie Mcgee, MD.  The care coordination team was consulted for assistance with disease management and care coordination needs.    Engaged with patient face to face for initial visit.  Recent office visits:  None noted.   Recent consult visits:  11/21/21 Cindie Crumbly MD - Melanocytic nevi of trunk  - Dermatology - No notes available.    11/13/21 Rudean Haskell MD - Cardiology - A fib - No medication changes. Follow up in 1 year.      Hospital visits:  None in previous 6 months   Objective:  Lab Results  Component Value Date   CREATININE 1.44 (H) 09/24/2021   BUN 34 (H) 09/24/2021   GFR 64.31 04/25/2013   GFRNONAA 57 (L) 07/03/2020   GFRAA 66 07/03/2020   NA 139 09/24/2021   K 4.7 09/24/2021   CALCIUM 9.1 09/24/2021   CO2 24 09/24/2021   GLUCOSE 85 09/24/2021    Lab Results  Component Value Date/Time   HGBA1C 6.1 (H) 12/03/2013 04:52 AM   GFR 64.31 04/25/2013 09:44 AM   GFR 62.71 12/08/2011 08:32 AM    Last diabetic Eye exam: No results found for: "HMDIABEYEEXA"  Last diabetic Foot exam: No results found for: "HMDIABFOOTEX"   Lab Results  Component Value Date   CHOL 116 09/24/2021   HDL 57 09/24/2021   LDLCALC 46 09/24/2021   TRIG 58 09/24/2021   CHOLHDL 2.0 09/24/2021       Latest Ref Rng & Units 09/24/2021    8:18 AM 07/03/2020    8:05 AM 12/07/2019   10:39 AM  Hepatic Function  Total Protein 6.1 - 8.1 g/dL 5.9  5.8  6.3   AST 10 -  35 U/L 25  20    ALT 9 - 46 U/L 25  19    Total Bilirubin 0.2 - 1.2 mg/dL 0.7  0.6      Lab Results  Component Value Date/Time   TSH 4.65 (H) 09/24/2021 08:18 AM   TSH 3.26 07/03/2020 08:05 AM       Latest Ref Rng & Units 09/24/2021    8:18 AM 11/27/2020    7:49 AM 07/03/2020    8:05 AM  CBC  WBC 3.8 - 10.8 Thousand/uL 6.5   7.1   Hemoglobin 13.2 - 17.1 g/dL 14.9  14.6  14.1   Hematocrit 38.5 - 50.0 % 45.9  43.0  43.3   Platelets 140 - 400 Thousand/uL 152   153     Lab Results  Component Value Date/Time   VITAMINB12 760 04/30/2009 10:50 PM    Clinical ASCVD: Yes  The ASCVD Risk score (Arnett DK, et al., 2019) failed to calculate for the following reasons:   The 2019 ASCVD risk score is only valid for ages 39 to 27        10/22/2021    9:28 AM 01/24/2021    8:20 AM 07/10/2020    8:27 AM  Depression screen  PHQ 2/9  Decreased Interest 0 0 0  Down, Depressed, Hopeless 0 0 0  PHQ - 2 Score 0 0 0     Social History   Tobacco Use  Smoking Status Former   Packs/day: 2.00   Years: 10.00   Total pack years: 20.00   Types: Cigarettes   Quit date: 03/17/1970   Years since quitting: 52.1  Smokeless Tobacco Never   BP Readings from Last 3 Encounters:  11/13/21 110/60  10/22/21 118/62  06/21/21 122/80   Pulse Readings from Last 3 Encounters:  11/13/21 (!) 57  10/22/21 (!) 51  06/21/21 (!) 51   Wt Readings from Last 3 Encounters:  11/13/21 172 lb (78 kg)  10/22/21 172 lb 8 oz (78.2 kg)  06/21/21 175 lb 12.8 oz (79.7 kg)   BMI Readings from Last 3 Encounters:  11/13/21 24.68 kg/m  10/22/21 24.75 kg/m  06/21/21 25.22 kg/m    Allergies  Allergen Reactions   Aldactone [Spironolactone]     Hyperkalemia   Nsaids Other (See Comments)    GI  Upset    Crestor [Rosuvastatin Calcium] Other (See Comments)    Joint pain   Penicillins Other (See Comments)    Unknown reaction Did it involve swelling of the face/tongue/throat, SOB, or low BP? Unknown Did it involve  sudden or severe rash/hives, skin peeling, or any reaction on the inside of your mouth or nose? Unknown Did you need to seek medical attention at a hospital or doctor's office? Unknown When did it last happen? More than 50 years ago   If all above answers are "NO", may proceed with cephalosporin use.     Medications Reviewed Today     Reviewed by Edythe Clarity, [facility] (Pharmacist) on 04/30/22 at [phone]  Med List Status: <None>   Medication Order Taking? Sig Documenting Provider Last Dose Status Informant  acetaminophen (TYLENOL) 325 MG tablet [id number] Yes Take 325-650 mg by mouth every 6 (six) hours as needed for moderate pain or headache. [provider] Taking Active Self  atorvastatin (LIPITOR) 40 MG tablet [id number] Yes TAKE 1 TABLET (40 MG TOTAL) BY MOUTH DAILY. PT NEEDS OV FOR FURTHER REFILLS Susy Frizzle, MD Taking Active   Coenzyme Q10 (COQ10) 200 MG CAPS [id number] Yes Take 200 mg by mouth every evening. [provider] Taking Active Self  ELIQUIS 5 MG TABS tablet [id number] Yes TAKE ONE TABLET BY MOUTH TWICE DAILY Chandrasekhar, Mahesh A, MD Taking Active   ezetimibe (ZETIA) 10 MG tablet [id number] Yes Take 1 tablet (10 mg total) by mouth daily. Susy Frizzle, MD Taking Active   JARDIANCE 25 MG TABS tablet [id number] Yes TAKE ONE TABLET BY MOUTH BEFORE Kathlynn Grate, MD Taking Active   Melatonin 5 MG TABS [id number] Yes Take 5 mg by mouth at bedtime. [provider] Taking Active Self  metoprolol succinate (TOPROL-XL) 25 MG 24 hr tablet [id number] Yes TAKE 1/2 TABLET BY MOUTH ONCE DAILY Chandrasekhar, Mahesh A, MD Taking Active   sacubitril-valsartan (ENTRESTO) 49-51 MG [id number] Yes Take 1 tablet by mouth 2 (two) times daily. Werner Lean, MD Taking Active   sildenafil (VIAGRA) 100 MG tablet [id number] Yes Take 0.5-1 tablets (50-100 mg total) by mouth daily as needed for erectile dysfunction. Susy Frizzle, MD Taking Active              SDOH:  (Social Determinants of Health) assessments and interventions performed: Yes Financial Resource Strain: Low Risk  (04/30/2022)   Overall  Financial Resource Strain (CARDIA)    Difficulty of Paying Living Expenses: Not hard at all   Food Insecurity: No Food Insecurity (04/30/2022)   Hunger Vital Sign    Worried About Running Out of Food in the Last Year: Never true    Ran Out of Food in the Last Year: Never true     SDOH Interventions    Flowsheet Row Clinical Support from 01/24/2021 in [place] Interventions   Food Insecurity Interventions Intervention Not Indicated  Housing Interventions Intervention Not Indicated  Transportation Interventions Intervention Not Indicated  Financial Strain Interventions Intervention Not Indicated  Physical Activity Interventions Intervention Not Indicated  Stress Interventions Intervention Not Indicated  Social Connections Interventions Intervention Not Indicated       Medication Assistance: None required.  Patient affirms current coverage meets needs.  Medication Access: Within the past 30 days, how often has patient missed a dose of medication? 0 Is a pillbox or other method used to improve adherence? Yes  Factors that may affect medication adherence? no barriers identified Are meds synced by current pharmacy? Yes  Are meds delivered by current pharmacy? Yes  Does patient experience delays in picking up medications due to transportation concerns? No   [facility] Reviewed: Is patient disadvantaged to use [facility]?: No  Current Rx insurance plan: HTA Name and location of [facility]  [id number] [place] [place] [address]: [phone]: [phone] [facility] [address]SM[address] Angel[address]ex[address]Phone: [phone] Fax: [phone] [id number] [place], [address]one: [phone]: [phone] [address] [address] [address] [facility]hone: [phone]: [phone] services reviewed with patient today?: Yes  Patient requests to transfer care to [facility]?: No  Reason patient declined to change pharmacies: Patient is already actively enrolled with [facility]  Compliance/Adherence/Medication fill history:  Star Rating Drugs:  Medication:                            Last Fill:         Day Supply   sacubitril-valsartan 49/51        04/08/22            90 atorvastatin 40 MG tablet       02/13/22          90   Care Gaps: Annual wellness visit in last year? No Last done 01/24/21   If Diabetic: Last eye exam / retinopathy screening: N/A Last diabetic foot exam: N/A   Assessment/Plan   Hypertension (BP goal <130/80) -Controlled -Current treatment: Metoprolol XL 2573mppropriate, Effective, Safe, Accessible -Medications previously tried: none noted  -Current home readings: normal, normal in office -Denies hypotensive/hypertensive symptoms -Educated on BP goals and benefits of medications for prevention of heart attack, stroke and kidney damage; -Counseled to monitor BP at home as current, document, and provide log at future appointments -Recommended to continue current medication  Hyperlipidemia: (LDL goal < 70) -Controlled, LDL most recently 46 -Current treatment: Atorvastatin 76m68mpropriate, Effective, Safe, Accessible Zetia 10mg26mropriate, Effective, Safe, Accessible -Medications previously tried: Repatha  -Educated on Cholesterol goals;  -  Recommended to continue current medication [facility] - discussed [facility] and he questions  pricing for this year.  Will FU with pharmacy and coordinate [facility] if we need to for cost reduction.  Heart Failure (Goal: manage symptoms and prevent exacerbations) -Controlled -Last ejection fraction: 35%  -HF type: HFrEF (EF < 40%) -NYHA Class: I (no actitivty limitation) -AHA HF Stage: B (Heart disease present - no symptoms present) -Current treatment: Metoprolol XL 20m Appropriate, Effective, Safe, Accessible Entresto 49-51 bid Appropriate, Effective, Safe, Accessible -Medications previously tried: none noted  -Current home BP/HR readings: normal -Denies and swelling or SOB -Educated on Benefits of medications for managing symptoms and prolonging life Importance of weighing daily; if you gain more than 3 pounds in one day or 5 pounds in one week, contact providers -Recommended to continue current medication  CBeverly Milch PharmD, CPP Clinical Pharmacist Practitioner [phone]

## 2022-04-30 NOTE — Telephone Encounter (Signed)
My Chart Message from patient:  Please enter a prescription for occasional cold sores. In the past, you had me on Zovorax cream. Send to [facility]

## 2022-05-01 ENCOUNTER — Other Ambulatory Visit: Payer: Self-pay | Admitting: Family Medicine

## 2022-05-01 DIAGNOSIS — I5022 Chronic systolic (congestive) heart failure: Secondary | ICD-10-CM

## 2022-05-01 MED ORDER — VALACYCLOVIR HCL 1 G PO TABS: 2000.0000 mg | ORAL_TABLET | Freq: Two times a day (BID) | ORAL | 5 refills | Status: DC

## 2022-05-01 NOTE — Telephone Encounter (Signed)
Requested Prescriptions  Pending Prescriptions Disp Refills   JARDIANCE 25 MG TABS tablet [Pharmacy Med Name: JARDIANCE 25 MG TABLET] 90 tablet 0    Sig: TAKE ONE TABLET BY MOUTH DAILY BEFORE BREAKFAST     Endocrinology:  Diabetes - SGLT2 Inhibitors Failed - 05/01/2022  3:10 PM      Failed - Cr in normal range and within 360 days    Creat  Date Value Ref Range Status  09/24/2021 1.44 (H) 0.70 - 1.22 mg/dL Final         Failed - HBA1C is between 0 and 7.9 and within 180 days    Hgb A1c MFr Bld  Date Value Ref Range Status  12/03/2013 6.1 (H) <5.7 % Final    Comment:    (NOTE)                                                                       According to the ADA Clinical Practice Recommendations for 2011, when HbA1c is used as a screening test:  >=6.5%   Diagnostic of Diabetes Mellitus           (if abnormal result is confirmed) 5.7-6.4%   Increased risk of developing Diabetes Mellitus References:Diagnosis and Classification of Diabetes Mellitus,Diabetes [id number] 1):S62-S69 and Standards of Medical Care in         Diabetes - 2011,Diabetes Care,2011,34 (Suppl 1):S11-S61.         Failed - eGFR in normal range and within 360 days    GFR, Est African American  Date Value Ref Range Status  07/03/2020 66 > OR = 60 mL/min/1.53m Final   GFR, Est Non African American  Date Value Ref Range Status  07/03/2020 57 (L) > OR = 60 mL/min/1.751mFinal   GFR  Date Value Ref Range Status  04/25/2013 64.31 >60.00 mL/min Final         Failed - Valid encounter within last 6 months    Recent Outpatient Visits           1 year ago Coronary artery disease involving native coronary artery of native heart without angina pectoris   [facility] Frizzle[facility]   2 years ago Hyperlipidemia, unspecified hyperlipidemia type   [facility] [place] FrizzleMD   3 years ago Hx-TIA (transient ischemic attack)   Br[facility]PiSusy FrizzleMD   3 years ago Left ventricular ejection fraction less than 4070ercent   BrP[facility]WaCammie McgeeMD   4 years ago Subacute frontal sinusitis   [place], WaCammie McgeeMD

## 2022-05-01 NOTE — Progress Notes (Signed)
Valtrex

## 2022-05-27 DIAGNOSIS — L570 Actinic keratosis: Secondary | ICD-10-CM | POA: Diagnosis not present

## 2022-06-19 DIAGNOSIS — H40023 Open angle with borderline findings, high risk, bilateral: Secondary | ICD-10-CM | POA: Diagnosis not present

## 2022-06-27 ENCOUNTER — Other Ambulatory Visit: Payer: Self-pay | Admitting: Internal Medicine

## 2022-07-02 ENCOUNTER — Telehealth: Payer: Self-pay | Admitting: Family Medicine

## 2022-07-02 NOTE — Telephone Encounter (Signed)
Contacted Selinda Michaels to schedule their annual wellness visit. Appointment made for 07/10/2022.  Thank you,  Judeth Cornfield,  AMB Clinical Support [facility] AWV Program Direct Dial ??[phone]

## 2022-07-02 NOTE — Telephone Encounter (Signed)
Called patient to schedule Medicare Annual Wellness Visit (AWV). Left message for patient to call back and schedule Medicare Annual Wellness Visit (AWV).  Patient has [employer]VANTAGE (Calendar) can be scheduled sooner than  10/24/2022   Last date of AWV: 10/22/2021   Please schedule an appointment at any time with Toni Amend, NHA .  If any questions, please contact me at [phone].  Thank you,  Judeth Cornfield,  AMB Clinical Support [facility] AWV Program Direct Dial ??[phone]

## 2022-07-09 NOTE — Progress Notes (Unsigned)
Subjective:   [name] is a 82 y.o. male who presents for Medicare Annual/Subsequent preventive examination.  Review of Systems    ***       Objective:    There were no vitals filed for this visit. There is no height or weight on file to calculate BMI.     01/24/2021    8:24 AM 11/27/2020    7:32 AM 08/26/2018    6:14 AM 12/02/2013    5:03 PM 12/11/2011    5:41 PM 12/11/2011    8:26 AM  Advanced Directives  Does Patient Have a Medical Advance Directive? Yes Yes Yes No Patient has advance directive, copy not in chart   Type of Advance Directive Healthcare Power of [place];Living will Healthcare Power of [place];Living will Healthcare Power of [place];Living will     Does patient want to make changes to medical advance directive?   No - Patient declined     Copy of Healthcare Power of Attorney in Chart? No - copy requested No - copy requested No - copy requested  Copy requested from other (Comment)   Pre-existing out of facility DNR order (yellow form or pink MOST form)      No    Current Medications (verified) Outpatient Encounter Medications as of 07/10/2022  Medication Sig   valACYclovir (VALTREX) 1000 MG tablet Take 2 tablets (2,000 mg total) by mouth 2 (two) times daily.   acetaminophen (TYLENOL) 325 MG tablet Take 325-650 mg by mouth every 6 (six) hours as needed for moderate pain or headache.   atorvastatin (LIPITOR) 40 MG tablet TAKE 1 TABLET (40 MG TOTAL) BY MOUTH DAILY. PT NEEDS OV FOR FURTHER REFILLS   Coenzyme Q10 (COQ10) 200 MG CAPS Take 200 mg by mouth every evening.   ELIQUIS 5 MG TABS tablet TAKE ONE TABLET BY MOUTH TWICE DAILY   ezetimibe (ZETIA) 10 MG tablet Take 1 tablet (10 mg total) by mouth daily.   JARDIANCE 25 MG TABS tablet TAKE ONE TABLET BY MOUTH DAILY BEFORE BREAKFAST   Melatonin 5 MG TABS Take 5 mg by mouth at bedtime.   metoprolol succinate (TOPROL-XL) 25 MG 24 hr tablet TAKE 1/2 TABLET BY MOUTH ONCE DAILY   sacubitril-valsartan (ENTRESTO)  49-51 MG TAKE ONE TABLET BY MOUTH TWICE DAILY   sildenafil (VIAGRA) 100 MG tablet Take 0.5-1 tablets (50-100 mg total) by mouth daily as needed for erectile dysfunction.   No facility-administered encounter medications on file as of 07/10/2022.    Allergies (verified) Aldactone [spironolactone], Nsaids, Crestor [rosuvastatin calcium], and Penicillins   History: Past Medical History:  Diagnosis Date   Atrial flutter (HCC)    a. s/p CTI ablation x2 by Dr Johney Frame 10/12, 9/13   Basal cell carcinoma of cheek    BPH (benign prostatic hyperplasia)    CAD (coronary artery disease)    nonobstructive on cath 08/2018   CHF (congestive heart failure) (HCC)    ef 35% on Echo 2020   CKD (chronic kidney disease) stage 3, GFR 30-59 ml/min (HCC)    Colon polyps    Diverticulosis    Ectopic atrial tachycardia (HCC)    a. Dx 07/2015: rate controlled    Hemorrhoids    Hyperlipidemia    Hypertension    Hypertrophic cardiomyopathy (HCC)    TIA (transient ischemic attack)    Past Surgical History:  Procedure Laterality Date   ATRIAL ABLATION SURGERY  10/12   CTI ablation for atrial flutter by Dr Johney Frame   ATRIAL FLUTTER  ABLATION N/A 12/11/2011   CTI ablation for atrial flutter by Dr Johney Frame   BASAL CELL CARCINOMA EXCISION  ~ 1973   left cheek   CARDIAC CATHETERIZATION  1960's   CARDIOVERSION N/A 11/27/2020   Procedure: CARDIOVERSION;  Surgeon: Jodelle Red, MD;  Location: [facility] ENDOSCOPY;  Service: Cardiovascular;  Laterality: N/A;   HERNIA REPAIR  ~ 1972   umbilical   JOINT REPLACEMENT     RIGHT/LEFT HEART CATH AND CORONARY ANGIOGRAPHY N/A 08/26/2018   Procedure: RIGHT/LEFT HEART CATH AND CORONARY ANGIOGRAPHY;  Surgeon: Tonny Bollman, MD;  Location: [facility] INVASIVE CV LAB;  Service: Cardiovascular;  Laterality: N/A;   TONSILLECTOMY AND ADENOIDECTOMY  1947   TOTAL HIP ARTHROPLASTY     left   Family History  Problem Relation Age of Onset   Breast cancer Mother    Ulcers Father         stomach   COPD Father    Tuberculosis Father    Colon polyps Brother    Tuberculosis Brother    Brain cancer Brother    Colon cancer Neg Hx    Stomach cancer Neg Hx    Rectal cancer Neg Hx    Esophageal cancer Neg Hx    Liver cancer Neg Hx    Social History   Socioeconomic History   Marital status: Married    Spouse name: Not on file   Number of children: 2   Years of education: Not on file   Highest education level: Not on file  Occupational History   Occupation: retired  Tobacco Use   Smoking status: Former    Packs/day: 2.00    Years: 10.00    Additional pack years: 0.00    Total pack years: 20.00    Types: Cigarettes    Quit date: 03/17/1970    Years since quitting: 52.3   Smokeless tobacco: Never  Vaping Use   Vaping Use: Never used  Substance and Sexual Activity   Alcohol use: No    Comment: quit 1995   Drug use: No   Sexual activity: Yes  Other Topics Concern   Not on file  Social History Narrative   Lives in [place] with spouse.  2 Grown children.  Retired from [employer]   Married x 32 years in 2022.   Social Determinants of Health   Financial Resource Strain: Low Risk  (04/30/2022)   Overall Financial Resource Strain (CARDIA)    Difficulty of Paying Living Expenses: Not hard at all  Food Insecurity: No Food Insecurity (04/30/2022)   Hunger Vital Sign    Worried About Running Out of Food in the Last Year: Never true    Ran Out of Food in the Last Year: Never true  Transportation Needs: No Transportation Needs (01/24/2021)   PRAPARE - Administrator, Civil Service (Medical): No    Lack of Transportation (Non-Medical): No  Physical Activity: Sufficiently Active (01/24/2021)   Exercise Vital Sign    Days of Exercise per Week: 5 days    Minutes of Exercise per Session: 60 min  Stress: No Stress Concern Present (01/24/2021)   [employer] of Occupational Health - Occupational Stress Questionnaire    Feeling of Stress :  Not at all  Social Connections: Socially Integrated (01/24/2021)   Social Connection and Isolation Panel [NHANES]    Frequency of Communication with Friends and Family: More than three times a week    Frequency of Social Gatherings with Friends and Family: More than  three times a week    Attends Religious Services: More than 4 times per year    Active Member of Clubs or Organizations: Yes    Attends Banker Meetings: More than 4 times per year    Marital Status: Married    Tobacco Counseling Counseling given: Not Answered   Clinical Intake:              How often do you need to have someone help you when you read instructions, pamphlets, or other written materials from your doctor or pharmacy?: (P) 1 - Never  Diabetic?No          Activities of Daily Living    07/09/2022    2:03 PM  In your present state of health, do you have any difficulty performing the following activities:  Hearing? 1  Vision? 0  Difficulty concentrating or making decisions? 0  Walking or climbing stairs? 0  Dressing or bathing? 0  Doing errands, shopping? 0  Preparing Food and eating ? N  Using the Toilet? N  In the past six months, have you accidently leaked urine? Y  Do you have problems with loss of bowel control? N  Managing your Finances? N  Housekeeping or managing your Housekeeping? N    Patient Care Team: Donita Brooks, MD as PCP - General (Family Medicine) Lanier Prude, MD as PCP - Electrophysiology (Cardiology) Erroll Luna, [facility] as Pharmacist (Pharmacist)  Indicate any recent Medical Services you may have received from other than [facility] providers in the past year (date may be approximate).     Assessment:   This is a routine wellness examination for Mickey.  Hearing/Vision screen No results found.  Dietary issues and exercise activities discussed:     Goals Addressed   None    Depression Screen    10/22/2021    9:28 AM 01/24/2021     8:20 AM 07/10/2020    8:27 AM 05/27/2018    4:13 PM 02/18/2018    2:27 PM 10/08/2017    8:01 AM 11/06/2016    9:15 AM  PHQ 2/9 Scores  PHQ - 2 Score 0 0 0 0 0 0 0    Fall Risk    07/09/2022    2:03 PM 01/24/2021    8:24 AM 07/10/2020    8:27 AM 05/27/2018    4:13 PM 02/18/2018    2:27 PM  Fall Risk   Falls in the past year? 0 0 0 0 0  Number falls in past yr:  0     Injury with Fall?  0     Risk for fall due to :  No Fall Risks No Fall Risks    Follow up  Falls prevention discussed Falls evaluation completed Falls evaluation completed Falls evaluation completed    FALL RISK PREVENTION PERTAINING TO THE HOME:  Any stairs in or around the home? {YES/NO:21197} If so, are there any without handrails? {YES/NO:21197} Home free of loose throw rugs in walkways, pet beds, electrical cords, etc? {YES/NO:21197} Adequate lighting in your home to reduce risk of falls? {YES/NO:21197}  ASSISTIVE DEVICES UTILIZED TO PREVENT FALLS:  Life alert? {YES/NO:21197} Use of a cane, walker or w/c? {YES/NO:21197} Grab bars in the bathroom? {YES/NO:21197} Shower chair or bench in shower? {YES/NO:21197} Elevated toilet seat or a handicapped toilet? {YES/NO:21197}  TIMED UP AND GO:  Was the test performed? No . Telephonic visit   Cognitive Function:        Immunizations Immunization History  Administered Date(s) Administered   Fluad Quad(high Dose 65+) 12/02/2018   Hepatitis A 12/06/1997, 09/14/1998   Hepatitis B 12/06/1997, 01/05/1998, 09/07/1998   Influenza, High Dose Seasonal PF 12/31/2016, 12/17/2017, 01/10/2020   Influenza,inj,Quad PF,6+ Mos 12/28/2012, 01/03/2014, 12/27/2014, 12/18/2015   PFIZER(Purple Top)SARS-COV-2 Vaccination 03/30/2019, 04/19/2019, 12/30/2019   Pfizer Covid-19 Vaccine Bivalent Booster 54yrs & up 12/19/2020   Pneumococcal Conjugate-13 12/27/2014   Pneumococcal-Unspecified 11/16/2010   Tdap 08/16/2011   Zoster Recombinat (Shingrix) 03/05/2017, 05/11/2017   Zoster,  Live 12/31/2010    TDAP status: Due, Education has been provided regarding the importance of this vaccine. Advised may receive this vaccine at local pharmacy or Health Dept. Aware to provide a copy of the vaccination record if obtained from local pharmacy or Health Dept. Verbalized acceptance and understanding.  Flu Vaccine status: Up to date  Pneumococcal vaccine status: Due, Education has been provided regarding the importance of this vaccine. Advised may receive this vaccine at local pharmacy or Health Dept. Aware to provide a copy of the vaccination record if obtained from local pharmacy or Health Dept. Verbalized acceptance and understanding.  Covid-19 vaccine status: Information provided on how to obtain vaccines.   Qualifies for Shingles Vaccine? Yes   Zostavax completed No   Shingrix Completed?: Yes  Screening Tests Health Maintenance  Topic Date Due   Pneumonia Vaccine 37+ Years old (2 of 2 - PPSV23 or PCV20) 12/27/2015   DTaP/Tdap/Td (2 - Td or Tdap) 08/15/2021   COVID-19 Vaccine (5 - 2023-24 season) 11/15/2021   INFLUENZA VACCINE  10/16/2022   Medicare Annual Wellness (AWV)  10/23/2022   Zoster Vaccines- Shingrix  Completed   HPV VACCINES  Aged Out    Health Maintenance  Health Maintenance Due  Topic Date Due   Pneumonia Vaccine 49+ Years old (2 of 2 - PPSV23 or PCV20) 12/27/2015   DTaP/Tdap/Td (2 - Td or Tdap) 08/15/2021   COVID-19 Vaccine (5 - 2023-24 season) 11/15/2021    Colorectal cancer screening: No longer required.   Lung Cancer Screening: (Low Dose CT Chest recommended if Age 50-80 years, 30 pack-year currently smoking OR have quit w/in 15years.) does not qualify.   Lung Cancer Screening Referral: n/a  Additional Screening:  Hepatitis C Screening: does not qualify;  Vision Screening: Recommended annual ophthalmology exams for early detection of glaucoma and other disorders of the eye. Is the patient up to date with their annual eye exam?   {YES/NO:21197} Who is the provider or what is the name of the office in which the patient attends annual eye exams? *** If pt is not established with a provider, would they like to be referred to a provider to establish care? {YES/NO:21197}.   Dental Screening: Recommended annual dental exams for proper oral hygiene  Community Resource Referral / Chronic Care Management: CRR required this visit?  {YES/NO:21197}  CCM required this visit?  {YES/NO:21197}     Plan:     I have personally reviewed and noted the following in the patient's chart:   Medical and social history Use of alcohol, tobacco or illicit drugs  Current medications and supplements including opioid prescriptions. {Opioid Prescriptions:[phone]} Functional ability and status Nutritional status Physical activity Advanced directives List of other physicians Hospitalizations, surgeries, and ER visits in previous 12 months Vitals Screenings to include cognitive, depression, and falls Referrals and appointments  In addition, I have reviewed and discussed with patient certain preventive protocols, quality metrics, and best practice recommendations. A written personalized care plan for preventive services as well as  general preventive health recommendations were provided to patient.     Durwin Nora, [place]   11/13/5619   Due to this being a virtual visit, the after visit summary with patients personalized plan was offered to patient via mail or my-chart. ***Patient declined at this time./ Patient would like to access on my-chart/ per request, patient was mailed a copy of AVS./ Patient preferred to pick up at office at next visit   Nurse Notes: ***

## 2022-07-09 NOTE — Patient Instructions (Incomplete)
[name] , Thank you for taking time to come for your Medicare Wellness Visit. I appreciate your ongoing commitment to your health goals. Please review the following plan we discussed and let me know if I can assist you in the future.   These are the goals we discussed:  Goals      Remain active and independent        This is a list of the screening recommended for you and due dates:  Health Maintenance  Topic Date Due   Pneumonia Vaccine (2 of 2 - PPSV23 or PCV20) 12/27/2015   DTaP/Tdap/Td vaccine (2 - Td or Tdap) 08/15/2021   COVID-19 Vaccine (5 - 2023-24 season) 11/15/2021   Flu Shot  10/16/2022   Medicare Annual Wellness Visit  07/10/2023   Zoster (Shingles) Vaccine  Completed   HPV Vaccine  Aged Out    Advanced directives: Please bring a copy of your health care power of attorney and living will to the office to be added to your chart at your convenience.   Conditions/risks identified: Aim for 30 minutes of exercise or brisk walking, 6-8 glasses of water, and 5 servings of fruits and vegetables each day.   Next appointment: Follow up in one year for your annual wellness visit.   Preventive Care 75 Years and Older, Male  Preventive care refers to lifestyle choices and visits with your health care provider that can promote health and wellness. What does preventive care include? A yearly physical exam. This is also called an annual well check. Dental exams once or twice a year. Routine eye exams. Ask your health care provider how often you should have your eyes checked. Personal lifestyle choices, including: Daily care of your teeth and gums. Regular physical activity. Eating a healthy diet. Avoiding tobacco and drug use. Limiting alcohol use. Practicing safe sex. Taking low doses of aspirin every day. Taking vitamin and mineral supplements as recommended by your health care provider. What happens during an annual well check? The services and screenings done by your  health care provider during your annual well check will depend on your age, overall health, lifestyle risk factors, and family history of disease. Counseling  Your health care provider may ask you questions about your: Alcohol use. Tobacco use. Drug use. Emotional well-being. Home and relationship well-being. Sexual activity. Eating habits. History of falls. Memory and ability to understand (cognition). Work and work Astronomer. Screening  You may have the following tests or measurements: Height, weight, and BMI. Blood pressure. Lipid and cholesterol levels. These may be checked every 5 years, or more frequently if you are over 74 years old. Skin check. Lung cancer screening. You may have this screening every year starting at age 72 if you have a 30-pack-year history of smoking and currently smoke or have quit within the past 15 years. Fecal occult blood test (FOBT) of the stool. You may have this test every year starting at age 51. Flexible sigmoidoscopy or colonoscopy. You may have a sigmoidoscopy every 5 years or a colonoscopy every 10 years starting at age 64. Prostate cancer screening. Recommendations will vary depending on your family history and other risks. Hepatitis C blood test. Hepatitis B blood test. Sexually transmitted disease (STD) testing. Diabetes screening. This is done by checking your blood sugar (glucose) after you have not eaten for a while (fasting). You may have this done every 1-3 years. Abdominal aortic aneurysm (AAA) screening. You may need this if you are a current or former smoker.  Osteoporosis. You may be screened starting at age 84 if you are at high risk. Talk with your health care provider about your test results, treatment options, and if necessary, the need for more tests. Vaccines  Your health care provider may recommend certain vaccines, such as: Influenza vaccine. This is recommended every year. Tetanus, diphtheria, and acellular pertussis  (Tdap, Td) vaccine. You may need a Td booster every 10 years. Zoster vaccine. You may need this after age 65. Pneumococcal 13-valent conjugate (PCV13) vaccine. One dose is recommended after age 51. Pneumococcal polysaccharide (PPSV23) vaccine. One dose is recommended after age 16. Talk to your health care provider about which screenings and vaccines you need and how often you need them. This information is not intended to replace advice given to you by your health care provider. Make sure you discuss any questions you have with your health care provider. Document Released: 03/30/2015 Document Revised: 11/21/2015 Document Reviewed: 01/02/2015 Elsevier Interactive Patient Education  2017 [employer] Prevention in the Home Falls can cause injuries. They can happen to people of all ages. There are many things you can do to make your home safe and to help prevent falls. What can I do on the outside of my home? Regularly fix the edges of walkways and driveways and fix any cracks. Remove anything that might make you trip as you walk through a door, such as a raised step or threshold. Trim any bushes or trees on the path to your home. Use bright outdoor lighting. Clear any walking paths of anything that might make someone trip, such as rocks or tools. Regularly check to see if handrails are loose or broken. Make sure that both sides of any steps have handrails. Any raised decks and porches should have guardrails on the edges. Have any leaves, snow, or ice cleared regularly. Use sand or salt on walking paths during winter. Clean up any spills in your garage right away. This includes oil or grease spills. What can I do in the bathroom? Use night lights. Install grab bars by the toilet and in the tub and shower. Do not use towel bars as grab bars. Use non-skid mats or decals in the tub or shower. If you need to sit down in the shower, use a plastic, non-slip stool. Keep the floor dry. Clean  up any water that spills on the floor as soon as it happens. Remove soap buildup in the tub or shower regularly. Attach bath mats securely with double-sided non-slip rug tape. Do not have throw rugs and other things on the floor that can make you trip. What can I do in the bedroom? Use night lights. Make sure that you have a light by your bed that is easy to reach. Do not use any sheets or blankets that are too big for your bed. They should not hang down onto the floor. Have a firm chair that has side arms. You can use this for support while you get dressed. Do not have throw rugs and other things on the floor that can make you trip. What can I do in the kitchen? Clean up any spills right away. Avoid walking on wet floors. Keep items that you use a lot in easy-to-reach places. If you need to reach something above you, use a strong step stool that has a grab bar. Keep electrical cords out of the way. Do not use floor polish or wax that makes floors slippery. If you must use wax, use non-skid floor  wax. Do not have throw rugs and other things on the floor that can make you trip. What can I do with my stairs? Do not leave any items on the stairs. Make sure that there are handrails on both sides of the stairs and use them. Fix handrails that are broken or loose. Make sure that handrails are as long as the stairways. Check any carpeting to make sure that it is firmly attached to the stairs. Fix any carpet that is loose or worn. Avoid having throw rugs at the top or bottom of the stairs. If you do have throw rugs, attach them to the floor with carpet tape. Make sure that you have a light switch at the top of the stairs and the bottom of the stairs. If you do not have them, ask someone to add them for you. What else can I do to help prevent falls? Wear shoes that: Do not have high heels. Have rubber bottoms. Are comfortable and fit you well. Are closed at the toe. Do not wear sandals. If you  use a stepladder: Make sure that it is fully opened. Do not climb a closed stepladder. Make sure that both sides of the stepladder are locked into place. Ask someone to hold it for you, if possible. Clearly mark and make sure that you can see: Any grab bars or handrails. First and last steps. Where the edge of each step is. Use tools that help you move around (mobility aids) if they are needed. These include: Canes. Walkers. Scooters. Crutches. Turn on the lights when you go into a dark area. Replace any light bulbs as soon as they burn out. Set up your furniture so you have a clear path. Avoid moving your furniture around. If any of your floors are uneven, fix them. If there are any pets around you, be aware of where they are. Review your medicines with your doctor. Some medicines can make you feel dizzy. This can increase your chance of falling. Ask your doctor what other things that you can do to help prevent falls. This information is not intended to replace advice given to you by your health care provider. Make sure you discuss any questions you have with your health care provider. Document Released: 12/28/2008 Document Revised: 08/09/2015 Document Reviewed: 04/07/2014 Elsevier Interactive Patient Education  2017 [employer].

## 2022-07-10 ENCOUNTER — Ambulatory Visit (INDEPENDENT_AMBULATORY_CARE_PROVIDER_SITE_OTHER): Payer: PPO

## 2022-07-10 VITALS — Ht 70.0 in | Wt 172.0 lb

## 2022-07-10 DIAGNOSIS — Z0000 Encounter for general adult medical examination without abnormal findings: Secondary | ICD-10-CM

## 2022-07-14 ENCOUNTER — Other Ambulatory Visit: Payer: Self-pay

## 2022-07-14 NOTE — Telephone Encounter (Signed)
Prescription Request  07/14/2022  LOV: 10/22/21  What is the name of the medication or equipment? atorvastatin (LIPITOR) 40 MG tablet [[id number]]  Have you contacted your pharmacy to request a refill? Yes   Which pharmacy would you like this sent to?  [id number] [place], [place] - [address] [place] [address] Phone: [phone] Fax: [phone]    Patient notified that their request is being sent to the clinical staff for review and that they should receive a response within 2 business days.   Please advise at [facility] [phone]

## 2022-07-15 MED ORDER — ATORVASTATIN CALCIUM 40 MG PO TABS: 40.0000 mg | ORAL_TABLET | Freq: Every day | ORAL | 1 refills | Status: DC

## 2022-07-15 NOTE — Telephone Encounter (Signed)
Requested medication (s) are due for refill today: yes  Requested medication (s) are on the active medication list: yes  Last refill:  02/13/22  Future visit scheduled: no  Notes to clinic:  Unable to refill per protocol, courtesy refill already given, routing for provider approval.      Requested Prescriptions  Pending Prescriptions Disp Refills   atorvastatin (LIPITOR) 40 MG tablet 90 tablet 1    Sig: Take 1 tablet (40 mg total) by mouth daily. PT NEEDS OV FOR FURTHER REFILLS     Cardiovascular:  Antilipid - Statins Failed - 07/14/2022 10:04 AM      Failed - Valid encounter within last 12 months    Recent Outpatient Visits           2 years ago Coronary artery disease involving native coronary artery of native heart without angina pectoris   [facility] Donita Brooks, MD   2 years ago Hyperlipidemia, unspecified hyperlipidemia type   [facility] Medicine Tanya Nones, Priscille Heidelberg, MD   3 years ago Hx-TIA (transient ischemic attack)   [facility] Medicine Donita Brooks, MD   4 years ago Left ventricular ejection fraction less than 40 percent   [facility] Medicine Pickard, Priscille Heidelberg, MD   4 years ago Subacute frontal sinusitis   [facility] Medicine Pickard, Priscille Heidelberg, MD              Failed - Lipid Panel in normal range within the last 12 months    Cholesterol, Total  Date Value Ref Range Status  11/15/2019 138 100 - 199 mg/dL Final   Cholesterol  Date Value Ref Range Status  09/24/2021 116 <200 mg/dL Final   LDL Cholesterol (Calc)  Date Value Ref Range Status  09/24/2021 46 mg/dL (calc) Final    Comment:    Reference range: <100 . Desirable range <100 mg/dL for primary prevention;   <70 mg/dL for patients with CHD or diabetic patients  with > or = 2 CHD risk factors. Marland Kitchen LDL-C is now calculated using the Martin-Hopkins  calculation, which is a validated novel method providing  better accuracy than the  Friedewald equation in the  estimation of LDL-C.  Horald Pollen et al. Lenox Ahr. 8119;147(82): 2061-2068  (http://education.[facility].com/faq/FAQ164)    HDL  Date Value Ref Range Status  09/24/2021 57 > OR = 40 mg/dL Final  95/62/1308 63 >65 mg/dL Final   Triglycerides  Date Value Ref Range Status  09/24/2021 58 <150 mg/dL Final         Passed - Patient is not pregnant

## 2022-07-23 ENCOUNTER — Other Ambulatory Visit: Payer: Self-pay

## 2022-07-23 ENCOUNTER — Telehealth: Payer: Self-pay | Admitting: Family Medicine

## 2022-07-23 DIAGNOSIS — Z8673 Personal history of transient ischemic attack (TIA), and cerebral infarction without residual deficits: Secondary | ICD-10-CM

## 2022-07-23 DIAGNOSIS — I10 Essential (primary) hypertension: Secondary | ICD-10-CM

## 2022-07-23 DIAGNOSIS — I5022 Chronic systolic (congestive) heart failure: Secondary | ICD-10-CM

## 2022-07-23 MED ORDER — ENTRESTO 49-51 MG PO TABS: 1.0000 | ORAL_TABLET | Freq: Two times a day (BID) | ORAL | 1 refills | Status: DC

## 2022-07-23 MED ORDER — ENTRESTO 49-51 MG PO TABS: 1.0000 | ORAL_TABLET | Freq: Two times a day (BID) | ORAL | 0 refills | Status: DC

## 2022-07-23 NOTE — Telephone Encounter (Signed)
Patient came to the office to follow up on script for sacubitril-valsartan (ENTRESTO) 49-51 MG   Patient just picked up a 30 day supply. Moving forward he's requesting for the provider to authorize a 90 day supply since it's less expensive.   Pharm[facility]  [id number] [place], [place] - [address] [place] [address] Phone: [phone]  Fax: [phone] DEA #: [id number]   Please advise patient at [phone]. Ok to leave a voicemail message if he's unavailable to accept the call.

## 2022-07-23 NOTE — Telephone Encounter (Signed)
Pt's medication was sent [facility] [address]. Confirmation received.

## 2022-07-28 ENCOUNTER — Telehealth: Payer: Self-pay | Admitting: Internal Medicine

## 2022-07-28 ENCOUNTER — Other Ambulatory Visit: Payer: Self-pay | Admitting: *Deleted

## 2022-07-28 DIAGNOSIS — I484 Atypical atrial flutter: Secondary | ICD-10-CM

## 2022-07-28 MED ORDER — APIXABAN 5 MG PO TABS
5.0000 mg | ORAL_TABLET | Freq: Two times a day (BID) | ORAL | 1 refills | Status: DC
Start: 2022-07-28 — End: 2023-03-04

## 2022-07-28 NOTE — Telephone Encounter (Signed)
Eliquis 5mg  refill request received. Patient is 82 years old, weight-78kg, Crea-1.44 on 09/24/21, Diagnosis-Aflutter, and last seen by Dr. Izora Ribas on 11/13/21. Dose is appropriate based on dosing criteria. Will send in refill to requested pharmacy.

## 2022-07-28 NOTE — Telephone Encounter (Signed)
*  STAT* If patient is at the pharmacy, call can be transferred to refill team.   1. Which medications need to be refilled? (please list name of each medication and dose if known)   ELIQUIS 5 MG TABS tablet    2. Which pharmacy/location (including street and city if local pharmacy) is medication to be sent to? [id number] [place], [place] - [address]   3. Do they need a 30 day or 90 day supply? 90 Day Supply

## 2022-07-28 NOTE — Telephone Encounter (Signed)
Eliquis 5mg refill request received. Patient is 82 years old, weight-78kg, Crea-1.44 on 09/24/21, Diagnosis-Aflutter, and last seen by Dr. Chandrasekhar on 11/13/21. Dose is appropriate based on dosing criteria. Will send in refill to requested pharmacy.   

## 2022-07-30 ENCOUNTER — Other Ambulatory Visit: Payer: PPO

## 2022-07-30 DIAGNOSIS — Z125 Encounter for screening for malignant neoplasm of prostate: Secondary | ICD-10-CM | POA: Diagnosis not present

## 2022-07-30 DIAGNOSIS — E785 Hyperlipidemia, unspecified: Secondary | ICD-10-CM

## 2022-07-30 DIAGNOSIS — E782 Mixed hyperlipidemia: Secondary | ICD-10-CM

## 2022-07-30 DIAGNOSIS — I10 Essential (primary) hypertension: Secondary | ICD-10-CM | POA: Diagnosis not present

## 2022-07-30 LAB — CBC WITH DIFFERENTIAL/PLATELET
Absolute Monocytes: 737 cells/uL (ref 200–950)
HCT: 45.3 % (ref 38.5–50.0)
MCHC: 33.1 g/dL (ref 32.0–36.0)
MPV: 11.4 fL (ref 7.5–12.5)
Platelets: 158 10*3/uL (ref 140–400)
RBC: 4.85 10*6/uL (ref 4.20–5.80)

## 2022-07-31 LAB — CBC WITH DIFFERENTIAL/PLATELET
Basophils Absolute: 32 cells/uL (ref 0–200)
Basophils Relative: 0.4 %
Eosinophils Absolute: 122 cells/uL (ref 15–500)
Eosinophils Relative: 1.5 %
Hemoglobin: 15 g/dL (ref 13.2–17.1)
Lymphs Abs: 2219 cells/uL (ref 850–3900)
MCH: 30.9 pg (ref 27.0–33.0)
MCV: 93.4 fL (ref 80.0–100.0)
Monocytes Relative: 9.1 %
Neutro Abs: 4990 cells/uL (ref 1500–7800)
Neutrophils Relative %: 61.6 %
RDW: 12.2 % (ref 11.0–15.0)
Total Lymphocyte: 27.4 %
WBC: 8.1 10*3/uL (ref 3.8–10.8)

## 2022-07-31 LAB — COMPLETE METABOLIC PANEL WITH GFR
AG Ratio: 2 (calc) (ref 1.0–2.5)
ALT: 32 U/L (ref 9–46)
AST: 31 U/L (ref 10–35)
Albumin: 4.2 g/dL (ref 3.6–5.1)
Alkaline phosphatase (APISO): 31 U/L — ABNORMAL LOW (ref 35–144)
BUN: 23 mg/dL (ref 7–25)
CO2: 25 mmol/L (ref 20–32)
Calcium: 9.2 mg/dL (ref 8.6–10.3)
Chloride: 107 mmol/L (ref 98–110)
Creat: 1.17 mg/dL (ref 0.70–1.22)
Globulin: 2.1 g/dL (calc) (ref 1.9–3.7)
Glucose, Bld: 86 mg/dL (ref 65–99)
Potassium: 4.9 mmol/L (ref 3.5–5.3)
Sodium: 139 mmol/L (ref 135–146)
Total Bilirubin: 0.8 mg/dL (ref 0.2–1.2)
Total Protein: 6.3 g/dL (ref 6.1–8.1)
eGFR: 62 mL/min/{1.73_m2} (ref >=60)

## 2022-07-31 LAB — THYROID PANEL WITH TSH
Free Thyroxine Index: 2.6 (ref 1.4–3.8)
T3 Uptake: 35 % (ref 22–35)
T4, Total: 7.3 ug/dL (ref 4.9–10.5)
TSH: 4.13 mIU/L (ref 0.40–4.50)

## 2022-07-31 LAB — LIPID PANEL
Cholesterol: 112 mg/dL (ref <200)
HDL: 60 mg/dL (ref >=40)
LDL Cholesterol (Calc): 38 mg/dL (calc)
Non-HDL Cholesterol (Calc): 52 mg/dL (calc) (ref <130)
Total CHOL/HDL Ratio: 1.9 (calc) (ref <5.0)
Triglycerides: 48 mg/dL (ref <150)

## 2022-07-31 LAB — PSA: PSA: 2.68 ng/mL (ref <=4.00)

## 2022-08-05 ENCOUNTER — Ambulatory Visit (INDEPENDENT_AMBULATORY_CARE_PROVIDER_SITE_OTHER): Payer: PPO | Admitting: Family Medicine

## 2022-08-05 ENCOUNTER — Encounter: Payer: Self-pay | Admitting: Family Medicine

## 2022-08-05 VITALS — BP 116/64 | HR 66 | Temp 97.6°F | Ht 70.0 in | Wt 168.2 lb

## 2022-08-05 DIAGNOSIS — I5020 Unspecified systolic (congestive) heart failure: Secondary | ICD-10-CM | POA: Diagnosis not present

## 2022-08-05 DIAGNOSIS — Z8673 Personal history of transient ischemic attack (TIA), and cerebral infarction without residual deficits: Secondary | ICD-10-CM | POA: Diagnosis not present

## 2022-08-05 DIAGNOSIS — I5022 Chronic systolic (congestive) heart failure: Secondary | ICD-10-CM | POA: Diagnosis not present

## 2022-08-05 DIAGNOSIS — I2510 Atherosclerotic heart disease of native coronary artery without angina pectoris: Secondary | ICD-10-CM

## 2022-08-05 NOTE — Progress Notes (Signed)
Subjective:    Patient ID: [name], male    DOB: 1940/10/27, 82 y.o.   MRN: [id number]   Patient is a very pleasant 82 year old Caucasian male with a history of atrial flutter status post ablation x2, nonobstructive coronary artery disease, and congestive heart failure with an ejection fraction of 35% on echocardiogram in 2020.  Repeat echo 8/21 showed EF of 30%.  Echocardiogram in 2022 showed ejection fraction had improved to 40%.  Currently he is in normal sinus rhythm.  The combination of maintaining normal sinus rhythm and treatment for heart failure as the patient feeling much better.  He states that his symptoms have completely subsided.  He denies any dyspnea on exertion, chest pain, orthopnea, paroxysmal nocturnal dyspnea.  The medications however are expensive specifically questions the benefit of Jardiance.  He is on 25 mg a day.  His most recent lab work is listed below   Past Medical History:  Diagnosis Date   Atrial flutter (HCC)    a. s/p CTI ablation x2 by Dr Johney Frame 10/12, 9/13   Basal cell carcinoma of cheek    BPH (benign prostatic hyperplasia)    CAD (coronary artery disease)    nonobstructive on cath 08/2018   CHF (congestive heart failure) (HCC)    ef 35% on Echo 2020   CKD (chronic kidney disease) stage 3, GFR 30-59 ml/min (HCC)    Colon polyps    Diverticulosis    Ectopic atrial tachycardia    a. Dx 07/2015: rate controlled    Hemorrhoids    Hyperlipidemia    Hypertension    Hypertrophic cardiomyopathy (HCC)    TIA (transient ischemic attack)    Past Surgical History:  Procedure Laterality Date   ATRIAL ABLATION SURGERY  10/12   CTI ablation for atrial flutter by Dr Johney Frame   ATRIAL FLUTTER ABLATION N/A 12/11/2011   CTI ablation for atrial flutter by Dr Johney Frame   BASAL CELL CARCINOMA EXCISION  ~ 1973   left cheek   CARDIAC CATHETERIZATION  1960's   CARDIOVERSION N/A 11/27/2020   Procedure: CARDIOVERSION;  Surgeon: Jodelle Red, MD;  Location:  [facility] ENDOSCOPY;  Service: Cardiovascular;  Laterality: N/A;   HERNIA REPAIR  ~ 1972   umbilical   JOINT REPLACEMENT     RIGHT/LEFT HEART CATH AND CORONARY ANGIOGRAPHY N/A 08/26/2018   Procedure: RIGHT/LEFT HEART CATH AND CORONARY ANGIOGRAPHY;  Surgeon: Tonny Bollman, MD;  Location: [facility] INVASIVE CV LAB;  Service: Cardiovascular;  Laterality: N/A;   TONSILLECTOMY AND ADENOIDECTOMY  1947   TOTAL HIP ARTHROPLASTY     left   Current Outpatient Medications on File Prior to Visit  Medication Sig Dispense Refill   acetaminophen (TYLENOL) 325 MG tablet Take 325-650 mg by mouth every 6 (six) hours as needed for moderate pain or headache.     apixaban (ELIQUIS) 5 MG TABS tablet Take 1 tablet (5 mg total) by mouth 2 (two) times daily. 180 tablet 1   atorvastatin (LIPITOR) 40 MG tablet Take 1 tablet (40 mg total) by mouth daily. PT NEEDS OV FOR FURTHER REFILLS 90 tablet 1   Coenzyme Q10 (COQ10) 200 MG CAPS Take 200 mg by mouth every evening.     ezetimibe (ZETIA) 10 MG tablet Take 1 tablet (10 mg total) by mouth daily. 90 tablet 3   JARDIANCE 25 MG TABS tablet TAKE ONE TABLET BY MOUTH DAILY BEFORE BREAKFAST 90 tablet 0   Melatonin 5 MG TABS Take 5 mg by mouth at bedtime.  metoprolol succinate (TOPROL-XL) 25 MG 24 hr tablet TAKE 1/2 TABLET BY MOUTH ONCE DAILY 45 tablet 2   sacubitril-valsartan (ENTRESTO) 49-51 MG Take 1 tablet by mouth 2 (two) times daily. 180 tablet 0   sildenafil (VIAGRA) 100 MG tablet Take 0.5-1 tablets (50-100 mg total) by mouth daily as needed for erectile dysfunction. 5 tablet 11   valACYclovir (VALTREX) 1000 MG tablet Take 2 tablets (2,000 mg total) by mouth 2 (two) times daily. 4 tablet 5   No current facility-administered medications on file prior to visit.   Allergies  Allergen Reactions   Aldactone [Spironolactone]     Hyperkalemia   Nsaids Other (See Comments)    GI  Upset    Crestor [Rosuvastatin Calcium] Other (See Comments)    Joint pain   Penicillins Other (See  Comments)    Unknown reaction Did it involve swelling of the face/tongue/throat, SOB, or low BP? Unknown Did it involve sudden or severe rash/hives, skin peeling, or any reaction on the inside of your mouth or nose? Unknown Did you need to seek medical attention at a hospital or doctor's office? Unknown When did it last happen? More than 50 years ago   If all above answers are "NO", may proceed with cephalosporin use.    Social History   Socioeconomic History   Marital status: Married    Spouse name: Not on file   Number of children: 2   Years of education: Not on file   Highest education level: Not on file  Occupational History   Occupation: retired  Tobacco Use   Smoking status: Former    Packs/day: 2.00    Years: 10.00    Additional pack years: 0.00    Total pack years: 20.00    Types: Cigarettes    Quit date: 03/17/1970    Years since quitting: 52.4   Smokeless tobacco: Never  Vaping Use   Vaping Use: Never used  Substance and Sexual Activity   Alcohol use: No    Comment: quit 1995   Drug use: No   Sexual activity: Yes  Other Topics Concern   Not on file  Social History Narrative   Lives in [place] with spouse.  2 Grown children.  Retired from [employer]   Married x 32 years in 2022.   Social Determinants of Health   Financial Resource Strain: Low Risk  (07/10/2022)   Overall Financial Resource Strain (CARDIA)    Difficulty of Paying Living Expenses: Not hard at all  Food Insecurity: No Food Insecurity (07/10/2022)   Hunger Vital Sign    Worried About Running Out of Food in the Last Year: Never true    Ran Out of Food in the Last Year: Never true  Transportation Needs: No Transportation Needs (07/10/2022)   PRAPARE - Administrator, Civil Service (Medical): No    Lack of Transportation (Non-Medical): No  Physical Activity: Sufficiently Active (07/10/2022)   Exercise Vital Sign    Days of Exercise per Week: 3 days    Minutes of  Exercise per Session: 120 min  Stress: Stress Concern Present (07/10/2022)   [employer] of Occupational Health - Occupational Stress Questionnaire    Feeling of Stress : To some extent  Social Connections: Moderately Integrated (07/10/2022)   Social Connection and Isolation Panel [NHANES]    Frequency of Communication with Friends and Family: Once a week    Frequency of Social Gatherings with Friends and Family: Once a week  Attends Religious Services: More than 4 times per year    Active Member of Clubs or Organizations: Yes    Attends Banker Meetings: More than 4 times per year    Marital Status: Married  Catering manager Violence: Not At Risk (07/10/2022)   Humiliation, Afraid, Rape, and Kick questionnaire    Fear of Current or Ex-Partner: No    Emotionally Abused: No    Physically Abused: No    Sexually Abused: No   Family History  Problem Relation Age of Onset   Breast cancer Mother    Ulcers Father        stomach   COPD Father    Tuberculosis Father    Colon polyps Brother    Tuberculosis Brother    Brain cancer Brother    Colon cancer Neg Hx    Stomach cancer Neg Hx    Rectal cancer Neg Hx    Esophageal cancer Neg Hx    Liver cancer Neg Hx       Review of Systems  All other systems reviewed and are negative.      Objective:   Physical Exam Vitals reviewed.  Constitutional:      General: He is not in acute distress.    Appearance: He is well-developed. He is not diaphoretic.  HENT:     Head: Normocephalic and atraumatic.     Right Ear: External ear normal.     Left Ear: External ear normal.     Nose: Nose normal.     Mouth/Throat:     Pharynx: No oropharyngeal exudate.  Eyes:     General: No scleral icterus.       Right eye: No discharge.        Left eye: No discharge.     Conjunctiva/sclera: Conjunctivae normal.     Pupils: Pupils are equal, round, and reactive to light.  Neck:     Thyroid: No thyromegaly.     Vascular: No JVD.      Trachea: No tracheal deviation.  Cardiovascular:     Rate and Rhythm: Normal rate and regular rhythm.     Heart sounds: Normal heart sounds. No murmur heard.    No friction rub. No gallop.  Pulmonary:     Effort: Pulmonary effort is normal. No respiratory distress.     Breath sounds: Normal breath sounds. No stridor. No wheezing or rales.  Chest:     Chest wall: No tenderness.  Abdominal:     General: Bowel sounds are normal. There is no distension.     Palpations: Abdomen is soft. There is no mass.     Tenderness: There is no abdominal tenderness. There is no guarding or rebound.  Musculoskeletal:        General: No tenderness. Normal range of motion.     Cervical back: Normal range of motion and neck supple.  Lymphadenopathy:     Cervical: No cervical adenopathy.  Skin:    General: Skin is warm.     Coloration: Skin is not pale.     Findings: No erythema or rash.  Neurological:     Mental Status: He is alert and oriented to person, place, and time.     Cranial Nerves: No cranial nerve deficit.     Motor: No abnormal muscle tone.     Coordination: Coordination normal.     Deep Tendon Reflexes: Reflexes are normal and symmetric.  Psychiatric:        Behavior: Behavior normal.  Thought Content: Thought content normal.        Judgment: Judgment normal.      Lab on 07/30/2022  Component Date Value Ref Range Status   Cholesterol 07/30/2022 112  <200 mg/dL Final   HDL 16/12/9602 60  > OR = 40 mg/dL Final   Triglycerides 54/11/8117 48  <150 mg/dL Final   LDL Cholesterol (Calc) 07/30/2022 38  mg/dL (calc) Final   Comment: Reference range: <100 . Desirable range <100 mg/dL for primary prevention;   <70 mg/dL for patients with CHD or diabetic patients  with > or = 2 CHD risk factors. Marland Kitchen LDL-C is now calculated using the Martin-Hopkins  calculation, which is a validated novel method providing  better accuracy than the Friedewald equation in the  estimation of  LDL-C.  Horald Pollen et al. Lenox Ahr. 1478;295(62): 2061-2068  (http://education.[facility].com/faq/FAQ164)    Total CHOL/HDL Ratio 07/30/2022 1.9  <1.3 (calc) Final   Non-HDL Cholesterol (Calc) 07/30/2022 52  <130 mg/dL (calc) Final   Comment: For patients with diabetes plus 1 major ASCVD risk  factor, treating to a non-HDL-C goal of <100 mg/dL  (LDL-C of <08 mg/dL) is considered a therapeutic  option.    Glucose, Bld 07/30/2022 86  65 - 99 mg/dL Final   Comment: .            Fasting reference interval .    BUN 07/30/2022 23  7 - 25 mg/dL Final   Creat 65/78/4696 1.17  0.70 - 1.22 mg/dL Final   eGFR 29/52/8413 62  > OR = 60 mL/min/1.70m2 Final   BUN/Creatinine Ratio 07/30/2022 SEE NOTE:  6 - 22 (calc) Final   Comment:    Not Reported: BUN and Creatinine are within    reference range. .    Sodium 07/30/2022 139  135 - 146 mmol/L Final   Potassium 07/30/2022 4.9  3.5 - 5.3 mmol/L Final   Chloride 07/30/2022 107  98 - 110 mmol/L Final   CO2 07/30/2022 25  20 - 32 mmol/L Final   Calcium 07/30/2022 9.2  8.6 - 10.3 mg/dL Final   Total Protein 24/40/1027 6.3  6.1 - 8.1 g/dL Final   Albumin 25/36/6440 4.2  3.6 - 5.1 g/dL Final   Globulin 34/74/2595 2.1  1.9 - 3.7 g/dL (calc) Final   AG Ratio 07/30/2022 2.0  1.0 - 2.5 (calc) Final   Total Bilirubin 07/30/2022 0.8  0.2 - 1.2 mg/dL Final   Alkaline phosphatase (APISO) 07/30/2022 31 (L)  35 - 144 U/L Final   AST 07/30/2022 31  10 - 35 U/L Final   ALT 07/30/2022 32  9 - 46 U/L Final   WBC 07/30/2022 8.1  3.8 - 10.8 Thousand/uL Final   RBC 07/30/2022 4.85  4.20 - 5.80 Million/uL Final   Hemoglobin 07/30/2022 15.0  13.2 - 17.1 g/dL Final   HCT 63/87/5643 45.3  38.5 - 50.0 % Final   MCV 07/30/2022 93.4  80.0 - 100.0 fL Final   MCH 07/30/2022 30.9  27.0 - 33.0 pg Final   MCHC 07/30/2022 33.1  32.0 - 36.0 g/dL Final   RDW 32/95/1884 12.2  11.0 - 15.0 % Final   Platelets 07/30/2022 158  140 - 400 Thousand/uL Final   MPV 07/30/2022 11.4   7.5 - 12.5 fL Final   Neutro Abs 07/30/2022 4,990  1,500 - 7,800 cells/uL Final   Lymphs Abs 07/30/2022 2,219  850 - 3,900 cells/uL Final   Absolute Monocytes 07/30/2022 737  200 - 950 cells/uL Final  Eosinophils Absolute 07/30/2022 122  15 - 500 cells/uL Final   Basophils Absolute 07/30/2022 32  0 - 200 cells/uL Final   Neutrophils Relative % 07/30/2022 61.6  % Final   Total Lymphocyte 07/30/2022 27.4  % Final   Monocytes Relative 07/30/2022 9.1  % Final   Eosinophils Relative 07/30/2022 1.5  % Final   Basophils Relative 07/30/2022 0.4  % Final   T3 Uptake 07/30/2022 35  22 - 35 % Final   T4, Total 07/30/2022 7.3  4.9 - 10.5 mcg/dL Final   Free Thyroxine Index 07/30/2022 2.6  1.4 - 3.8 Final   TSH 07/30/2022 4.13  0.40 - 4.50 mIU/L Final   PSA 07/30/2022 2.68  < OR = 4.00 ng/mL Final   Comment: The total PSA value from this assay system is  standardized against the WHO standard. The test  result will be approximately 20% lower when compared  to the equimolar-standardized total PSA (Beckman  Coulter). Comparison of serial PSA results should be  interpreted with this fact in mind. . This test was performed using the Siemens  chemiluminescent method. Values obtained from  different assay methods cannot be used interchangeably. PSA levels, regardless of value, should not be interpreted as absolute evidence of the presence or absence of disease.         Assessment & Plan:  Chronic systolic CHF (congestive heart failure) (HCC)  Hx-TIA (transient ischemic attack)  Coronary artery disease involving native coronary artery of native heart without angina pectoris  HFrEF (heart failure with reduced ejection fraction) (HCC) I am extremely happy with the patient's lab work.  His blood pressure is outstanding.  His LDL cholesterol is well below his goal of 55.  His liver and kidney test are stable.  He is asymptomatic.  Continue current treatment for congestive heart failure.  I did  encourage the patient to break Jardiance in half and take 12.5 mg daily.  This will hopefully reduce the effect of cost of the medication and make it more affordable for him PSA is stable.  Recheck in 6 months

## 2022-08-12 ENCOUNTER — Other Ambulatory Visit: Payer: Self-pay

## 2022-08-12 ENCOUNTER — Telehealth: Payer: Self-pay | Admitting: Family Medicine

## 2022-08-12 DIAGNOSIS — I5022 Chronic systolic (congestive) heart failure: Secondary | ICD-10-CM

## 2022-08-12 MED ORDER — EMPAGLIFLOZIN 25 MG PO TABS
ORAL_TABLET | ORAL | 1 refills | Status: DC
Start: 2022-08-12 — End: 2023-06-05

## 2022-08-12 NOTE — Telephone Encounter (Signed)
Prescription Request  08/12/2022  LOV: 08/05/2022  What is the name of the medication or equipment? JARDIANCE 25 MG TABS tablet   Have you contacted your pharmacy to request a refill? Yes   Which pharmacy would you like this sent to?  [id number] [place], [place] - [address] [place] [address] Phone: [phone] Fax: [phone]    Patient notified that their request is being sent to the clinical staff for review and that they should receive a response within 2 business days.   Please advise at [facility] [phone]

## 2022-08-19 ENCOUNTER — Other Ambulatory Visit: Payer: Self-pay | Admitting: Internal Medicine

## 2022-08-19 DIAGNOSIS — I5022 Chronic systolic (congestive) heart failure: Secondary | ICD-10-CM

## 2022-08-19 DIAGNOSIS — I10 Essential (primary) hypertension: Secondary | ICD-10-CM

## 2022-08-19 DIAGNOSIS — Z8673 Personal history of transient ischemic attack (TIA), and cerebral infarction without residual deficits: Secondary | ICD-10-CM

## 2022-08-19 MED ORDER — ENTRESTO 49-51 MG PO TABS
1.0000 | ORAL_TABLET | Freq: Two times a day (BID) | ORAL | 0 refills | Status: DC
Start: 2022-08-19 — End: 2023-03-09

## 2022-09-24 ENCOUNTER — Telehealth: Payer: Self-pay

## 2022-09-24 NOTE — Telephone Encounter (Signed)
Pt came in to request a refill of this med atorvastatin (LIPITOR) 40 MG tablet [[id number]]. Pt stated that this med was denied by pharmacy. Pt is completely out of this med. Please advise.   LOV: 08/05/22  PHARMACY: [id number] [place], [place] - [address] [place], [place] [address] Phone: [phone]  Fax: [phone]  CB#: [phone]

## 2022-09-25 ENCOUNTER — Other Ambulatory Visit: Payer: Self-pay

## 2022-09-25 DIAGNOSIS — E782 Mixed hyperlipidemia: Secondary | ICD-10-CM

## 2022-09-25 MED ORDER — ATORVASTATIN CALCIUM 40 MG PO TABS
40.0000 mg | ORAL_TABLET | Freq: Every day | ORAL | 1 refills | Status: DC
Start: 2022-09-25 — End: 2023-03-25

## 2022-12-02 DIAGNOSIS — Z08 Encounter for follow-up examination after completed treatment for malignant neoplasm: Secondary | ICD-10-CM | POA: Diagnosis not present

## 2022-12-02 DIAGNOSIS — L821 Other seborrheic keratosis: Secondary | ICD-10-CM | POA: Diagnosis not present

## 2022-12-02 DIAGNOSIS — D225 Melanocytic nevi of trunk: Secondary | ICD-10-CM | POA: Diagnosis not present

## 2022-12-02 DIAGNOSIS — Z85828 Personal history of other malignant neoplasm of skin: Secondary | ICD-10-CM | POA: Diagnosis not present

## 2022-12-02 DIAGNOSIS — Z85820 Personal history of malignant melanoma of skin: Secondary | ICD-10-CM | POA: Diagnosis not present

## 2022-12-02 DIAGNOSIS — L570 Actinic keratosis: Secondary | ICD-10-CM | POA: Diagnosis not present

## 2022-12-02 DIAGNOSIS — L814 Other melanin hyperpigmentation: Secondary | ICD-10-CM | POA: Diagnosis not present

## 2022-12-15 ENCOUNTER — Other Ambulatory Visit: Payer: Self-pay

## 2022-12-15 ENCOUNTER — Telehealth: Payer: Self-pay

## 2022-12-15 DIAGNOSIS — I2510 Atherosclerotic heart disease of native coronary artery without angina pectoris: Secondary | ICD-10-CM

## 2022-12-15 DIAGNOSIS — N522 Drug-induced erectile dysfunction: Secondary | ICD-10-CM

## 2022-12-15 DIAGNOSIS — I10 Essential (primary) hypertension: Secondary | ICD-10-CM

## 2022-12-15 MED ORDER — SILDENAFIL CITRATE 100 MG PO TABS
50.0000 mg | ORAL_TABLET | Freq: Every day | ORAL | 11 refills | Status: DC | PRN
Start: 2022-12-15 — End: 2023-12-21

## 2022-12-15 NOTE — Telephone Encounter (Signed)
Prescription Request  12/15/2022  LOV: 08/05/22  What is the name of the medication or equipment? sildenafil (VIAGRA) 100 MG tablet [[id number]]  Have you contacted your pharmacy to request a refill? Yes   Which pharmacy would you like this sent to?  [id number] [place], [place] - [address] [place] [address] Phone: [phone] Fax: [phone]    Patient notified that their request is being sent to the clinical staff for review and that they should receive a response within 2 business days.   Please advise at [facility] [phone]

## 2023-01-02 ENCOUNTER — Other Ambulatory Visit: Payer: Self-pay | Admitting: Internal Medicine

## 2023-01-26 ENCOUNTER — Other Ambulatory Visit: Payer: Self-pay | Admitting: Internal Medicine

## 2023-02-08 ENCOUNTER — Encounter: Payer: Self-pay | Admitting: Family Medicine

## 2023-02-09 ENCOUNTER — Other Ambulatory Visit: Payer: Self-pay

## 2023-02-09 DIAGNOSIS — E782 Mixed hyperlipidemia: Secondary | ICD-10-CM

## 2023-02-09 MED ORDER — EZETIMIBE 10 MG PO TABS
10.0000 mg | ORAL_TABLET | Freq: Every day | ORAL | 3 refills | Status: DC
Start: 2023-02-09 — End: 2024-02-02

## 2023-02-22 ENCOUNTER — Other Ambulatory Visit: Payer: Self-pay | Admitting: Internal Medicine

## 2023-03-04 ENCOUNTER — Other Ambulatory Visit: Payer: Self-pay | Admitting: Internal Medicine

## 2023-03-04 DIAGNOSIS — I484 Atypical atrial flutter: Secondary | ICD-10-CM

## 2023-03-04 NOTE — Telephone Encounter (Signed)
Prescription refill request for Eliquis received. Indication: A Flutter Last office visit: 11/13/21  Merlyn Lot MD Scr: 1.17 on 07/30/22  Epic Age: 82 Weight: 78kg  Based on above findings Eliquis 5mg  twice daily is the appropriate dose.  Pt is past due for appt with Dr Izora Ribas.  Message sent to schedulers.  Refill approved x 1.

## 2023-03-08 ENCOUNTER — Other Ambulatory Visit: Payer: Self-pay | Admitting: Internal Medicine

## 2023-03-08 DIAGNOSIS — I10 Essential (primary) hypertension: Secondary | ICD-10-CM

## 2023-03-08 DIAGNOSIS — Z8673 Personal history of transient ischemic attack (TIA), and cerebral infarction without residual deficits: Secondary | ICD-10-CM

## 2023-03-08 DIAGNOSIS — I5022 Chronic systolic (congestive) heart failure: Secondary | ICD-10-CM

## 2023-03-25 ENCOUNTER — Other Ambulatory Visit: Payer: Self-pay | Admitting: Family Medicine

## 2023-03-25 ENCOUNTER — Other Ambulatory Visit: Payer: Self-pay | Admitting: Internal Medicine

## 2023-03-25 DIAGNOSIS — E782 Mixed hyperlipidemia: Secondary | ICD-10-CM

## 2023-04-06 ENCOUNTER — Other Ambulatory Visit: Payer: Self-pay | Admitting: Internal Medicine

## 2023-04-06 DIAGNOSIS — I5022 Chronic systolic (congestive) heart failure: Secondary | ICD-10-CM

## 2023-04-06 DIAGNOSIS — I10 Essential (primary) hypertension: Secondary | ICD-10-CM

## 2023-04-06 DIAGNOSIS — Z8673 Personal history of transient ischemic attack (TIA), and cerebral infarction without residual deficits: Secondary | ICD-10-CM

## 2023-04-18 ENCOUNTER — Other Ambulatory Visit: Payer: Self-pay | Admitting: Internal Medicine

## 2023-04-18 DIAGNOSIS — I5022 Chronic systolic (congestive) heart failure: Secondary | ICD-10-CM

## 2023-04-18 DIAGNOSIS — I484 Atypical atrial flutter: Secondary | ICD-10-CM

## 2023-04-18 DIAGNOSIS — Z8673 Personal history of transient ischemic attack (TIA), and cerebral infarction without residual deficits: Secondary | ICD-10-CM

## 2023-04-18 DIAGNOSIS — I10 Essential (primary) hypertension: Secondary | ICD-10-CM

## 2023-04-21 NOTE — Telephone Encounter (Signed)
 Eliquis  5mg  refill request received. Patient is 83 years old, weight-76.3kg, Crea-1.17 on 07/30/22, Diagnosis-Aflutter, and last seen by Dr. Santo on 11/13/21. Dose is appropriate based on dosing criteria.   Pt needs an appt with Cardiologist. Last refill sent 03/04/23 for 90 day supply

## 2023-04-22 NOTE — Telephone Encounter (Signed)
 Pt has scheduled appt with Dr Paulita Boss on 06/25/23. Appropriate dose. Refill sent.

## 2023-05-07 ENCOUNTER — Encounter: Payer: PPO | Admitting: Pharmacist

## 2023-05-31 ENCOUNTER — Other Ambulatory Visit: Payer: Self-pay | Admitting: Internal Medicine

## 2023-05-31 DIAGNOSIS — I5022 Chronic systolic (congestive) heart failure: Secondary | ICD-10-CM

## 2023-05-31 DIAGNOSIS — Z8673 Personal history of transient ischemic attack (TIA), and cerebral infarction without residual deficits: Secondary | ICD-10-CM

## 2023-05-31 DIAGNOSIS — I10 Essential (primary) hypertension: Secondary | ICD-10-CM

## 2023-06-03 DIAGNOSIS — Z85828 Personal history of other malignant neoplasm of skin: Secondary | ICD-10-CM | POA: Diagnosis not present

## 2023-06-03 DIAGNOSIS — L821 Other seborrheic keratosis: Secondary | ICD-10-CM | POA: Diagnosis not present

## 2023-06-03 DIAGNOSIS — Z08 Encounter for follow-up examination after completed treatment for malignant neoplasm: Secondary | ICD-10-CM | POA: Diagnosis not present

## 2023-06-03 DIAGNOSIS — L814 Other melanin hyperpigmentation: Secondary | ICD-10-CM | POA: Diagnosis not present

## 2023-06-03 DIAGNOSIS — Z85820 Personal history of malignant melanoma of skin: Secondary | ICD-10-CM | POA: Diagnosis not present

## 2023-06-03 DIAGNOSIS — D225 Melanocytic nevi of trunk: Secondary | ICD-10-CM | POA: Diagnosis not present

## 2023-06-03 DIAGNOSIS — L570 Actinic keratosis: Secondary | ICD-10-CM | POA: Diagnosis not present

## 2023-06-05 ENCOUNTER — Other Ambulatory Visit: Payer: Self-pay | Admitting: Family Medicine

## 2023-06-05 DIAGNOSIS — I5022 Chronic systolic (congestive) heart failure: Secondary | ICD-10-CM

## 2023-06-05 NOTE — Telephone Encounter (Signed)
 Requested Prescriptions  Pending Prescriptions Disp Refills   empagliflozin (JARDIANCE) 25 MG TABS tablet [Pharmacy Med Name: JARDIANCE 25 MG TABLET] 90 tablet 0    Sig: TAKE ONE TABLET BY MOUTH DAILY BEFORE BREAKFAST     Endocrinology:  Diabetes - SGLT2 Inhibitors Failed - 06/05/2023  4:00 PM      Failed - HBA1C is between 0 and 7.9 and within 180 days    Hgb A1c MFr Bld  Date Value Ref Range Status  12/03/2013 6.1 (H) <5.7 % Final    Comment:    (NOTE)                                                                       According to the ADA Clinical Practice Recommendations for 2011, when HbA1c is used as a screening test:  >=6.5%   Diagnostic of Diabetes Mellitus           (if abnormal result is confirmed) 5.7-6.4%   Increased risk of developing Diabetes Mellitus References:Diagnosis and Classification of Diabetes Mellitus,Diabetes Care,2011,34(Suppl 1):S62-S69 and Standards of Medical Care in         Diabetes - 2011,Diabetes Care,2011,34 (Suppl 1):S11-S61.         Failed - Valid encounter within last 6 months    Recent Outpatient Visits           2 years ago Coronary artery disease involving native coronary artery of native heart without angina pectoris   [facility] Donita Brooks, MD   3 years ago Hyperlipidemia, unspecified hyperlipidemia type   [facility] Medicine Tanya Nones, Priscille Heidelberg, MD   4 years ago Hx-TIA (transient ischemic attack)   [facility] Donita Brooks, MD   5 years ago Left ventricular ejection fraction less than 40 percent   [facility] Pickard, Priscille Heidelberg, MD   5 years ago Subacute frontal sinusitis   [facility] Pickard, Priscille Heidelberg, MD       Future Appointments             In 2 weeks Christell Constant, MD [facility]            Passed - Cr in normal range and within 360 days    Creat  Date Value Ref Range Status   07/30/2022 1.17 0.70 - 1.22 mg/dL Final         Passed - eGFR in normal range and within 360 days    GFR, Est African American  Date Value Ref Range Status  07/03/2020 66 > OR = 60 mL/min/1.58m2 Final   GFR, Est Non African American  Date Value Ref Range Status  07/03/2020 57 (L) > OR = 60 mL/min/1.46m2 Final   GFR  Date Value Ref Range Status  04/25/2013 64.31 >60.00 mL/min Final   eGFR  Date Value Ref Range Status  07/30/2022 62 > OR = 60 mL/min/1.84m2 Final

## 2023-06-07 ENCOUNTER — Other Ambulatory Visit: Payer: Self-pay | Admitting: Family Medicine

## 2023-06-09 NOTE — Telephone Encounter (Signed)
 Requested medication (s) are due for refill today: expired date on medication   Requested medication (s) are on the active medication list: yes   Last refill:  05/01/22 #4 tablet 5 refills   Future visit scheduled: no   Notes to clinic:  last OV 08/05/22. Expired medication date. Do you want to renew Rx?     Requested Prescriptions  Pending Prescriptions Disp Refills   valACYclovir (VALTREX) 1000 MG tablet [Pharmacy Med Name: VALACYCLOVIR HCL 1 GRAM TABLET] 4 tablet 5    Sig: TAKE 2 TABLETS (2,000 MG TOTAL) BY MOUTH TWICE A DAY     Antimicrobials:  Antiviral Agents - Anti-Herpetic Failed - 06/09/2023  8:43 AM      Failed - Valid encounter within last 12 months    Recent Outpatient Visits           2 years ago Coronary artery disease involving native coronary artery of native heart without angina pectoris   [facility] Donita Brooks, MD   3 years ago Hyperlipidemia, unspecified hyperlipidemia type   [facility] Donita Brooks, MD   4 years ago Hx-TIA (transient ischemic attack)   [facility] Donita Brooks, MD   5 years ago Left ventricular ejection fraction less than 40 percent   [facility] Pickard, Priscille Heidelberg, MD   5 years ago Subacute frontal sinusitis   [facility] Pickard, Priscille Heidelberg, MD       Future Appointments             In 2 weeks Izora Ribas, Rondel Jumbo, MD [facility], [id number]

## 2023-06-25 ENCOUNTER — Ambulatory Visit: Payer: PPO | Attending: Internal Medicine | Admitting: Internal Medicine

## 2023-06-25 ENCOUNTER — Other Ambulatory Visit: Payer: Self-pay | Admitting: Family Medicine

## 2023-06-25 VITALS — BP 112/62 | HR 47 | Ht 70.0 in | Wt 172.4 lb

## 2023-06-25 DIAGNOSIS — I10 Essential (primary) hypertension: Secondary | ICD-10-CM | POA: Diagnosis not present

## 2023-06-25 DIAGNOSIS — I5022 Chronic systolic (congestive) heart failure: Secondary | ICD-10-CM

## 2023-06-25 DIAGNOSIS — I5020 Unspecified systolic (congestive) heart failure: Secondary | ICD-10-CM

## 2023-06-25 DIAGNOSIS — Z8673 Personal history of transient ischemic attack (TIA), and cerebral infarction without residual deficits: Secondary | ICD-10-CM | POA: Diagnosis not present

## 2023-06-25 DIAGNOSIS — I484 Atypical atrial flutter: Secondary | ICD-10-CM

## 2023-06-25 DIAGNOSIS — I2510 Atherosclerotic heart disease of native coronary artery without angina pectoris: Secondary | ICD-10-CM | POA: Diagnosis not present

## 2023-06-25 MED ORDER — ENTRESTO 49-51 MG PO TABS: 1.0000 | ORAL_TABLET | Freq: Two times a day (BID) | ORAL | 3 refills | Status: DC

## 2023-06-25 NOTE — Patient Instructions (Addendum)
 Medication Instructions:  Your physician has recommended you make the following change in your medication:   1) STOP metoprolol  Your Sherryll Burger was refilled today.  *If you need a refill on your cardiac medications before your next appointment, please call your pharmacy*  Follow-Up: At [facility], you and your health needs are our priority.  As part of our continuing mission to provide you with exceptional heart care, we have created designated Provider Care Teams.  These Care Teams include your primary Cardiologist (physician) and Advanced Practice Providers (APPs -  Physician Assistants and Nurse Practitioners) who all work together to provide you with the care you need, when you need it.  Your next appointment:   1 year(s)  The format for your next appointment:   In Person  Provider:   Christell Constant, MD {  Other Instructions   1st Floor: - Lobby - Registration  - Pharmacy  - Lab - Cafe  2nd Floor: - PV Lab - Diagnostic Testing (echo, CT, nuclear med)  3rd Floor: - Vacant  4th Floor: - TCTS (cardiothoracic surgery) - AFib Clinic - Structural Heart Clinic - Vascular Surgery  - Vascular Ultrasound  5th Floor: - HeartCare Cardiology (general and EP) - Clinical Pharmacy for coumadin, hypertension, lipid, weight-loss medications, and med management appointments    Valet parking services will be available as well.

## 2023-06-25 NOTE — Progress Notes (Signed)
 Cardiology Office Note:    Date:  06/25/2023   ID:  [name], DOB 08-Oct-1940, MRN [id number]  PCP:  Donita Brooks, MD  [facility] Cardiologist: Riley Lam MD [facility] Electrophysiologist:  Lanier Prude, MD   CC:  Follow up HFrEF  History of Present Illness:    [name] is a 83 y.o. male with a hx of Atrial flutter s/p multiple CT ablations last in 2013 (6+), AT, Suspicion for Obstructive CAD with LCP in 08/2018 and scar on MRI, Hx of HTN with  HLD , prior TIA. CKD stage III who presented for evaluation for HCM eval 12/22/19.  2021 In interim from this visit had Cardiac MRI for evaluation of etiology of HF:  Found to have disease consistent with prior LAD infarct and improvement of LVEF. Diagnostic not met for HCM. 2022:  In interim from 11/9 saw Dr. Johney Frame with no changes need and got paperwork done for [facility].   2023: planned for conservative AF approach  [name] is a 84 year old man. with atrial flutter and coronary artery disease who presents for cardiovascular follow-up.  He has a history of atrial flutter and has undergone multiple ablations. Currently, he is managed with a conservative approach for atrial fibrillation.  He has obstructive coronary artery disease and a prior LAD infarct, likely involving a diagonal branch. He was started on goal-directed medical therapy for heart failure with reduced ejection fraction, and his ejection fraction has since recovered.  There was a consideration of hypertrophic cardiomyopathy, and he underwent a CMR evaluation for this- no evidence of HCM.   He continues to work at [employer].  Today, heart rate was noted to be 47, although he usually observes a heart rate of around 60. He has been taking metoprolol, but there is consideration to stop it due to his low heart rate.  Socially, he remains active, engaging in strength training at the [facility] a couple of times a week and maintaining an active lifestyle  [place]. He reports feeling physically strong, although not as strong as he used to be, and emphasizes the importance of maintaining physical activity as he ages.   Past Medical History:  Diagnosis Date   Atrial flutter (HCC)    a. s/p CTI ablation x2 by Dr Johney Frame 10/12, 9/13   Basal cell carcinoma of cheek    BPH (benign prostatic hyperplasia)    CAD (coronary artery disease)    nonobstructive on cath 08/2018   CHF (congestive heart failure) (HCC)    ef 35% on Echo 2020   CKD (chronic kidney disease) stage 3, GFR 30-59 ml/min (HCC)    Colon polyps    Diverticulosis    Ectopic atrial tachycardia    a. Dx 07/2015: rate controlled    Hemorrhoids    Hyperlipidemia    Hypertension    Hypertrophic cardiomyopathy (HCC)    TIA (transient ischemic attack)     Past Surgical History:  Procedure Laterality Date   ATRIAL ABLATION SURGERY  10/12   CTI ablation for atrial flutter by Dr Johney Frame   ATRIAL FLUTTER ABLATION N/A 12/11/2011   CTI ablation for atrial flutter by Dr Johney Frame   BASAL CELL CARCINOMA EXCISION  ~ 1973   left cheek   CARDIAC CATHETERIZATION  1960's   CARDIOVERSION N/A 11/27/2020   Procedure: CARDIOVERSION;  Surgeon: Jodelle Red, MD;  Location: [facility] ENDOSCOPY;  Service: Cardiovascular;  Laterality: N/A;   HERNIA REPAIR  ~ 1972  umbilical   JOINT REPLACEMENT     RIGHT/LEFT HEART CATH AND CORONARY ANGIOGRAPHY N/A 08/26/2018   Procedure: RIGHT/LEFT HEART CATH AND CORONARY ANGIOGRAPHY;  Surgeon: Tonny Bollman, MD;  Location: [facility] INVASIVE CV LAB;  Service: Cardiovascular;  Laterality: N/A;   TONSILLECTOMY AND ADENOIDECTOMY  1947   TOTAL HIP ARTHROPLASTY     left   Current Medications: Current Meds  Medication Sig   acetaminophen (TYLENOL) 325 MG tablet Take 325-650 mg by mouth every 6 (six) hours as needed for moderate pain or headache.   apixaban (ELIQUIS) 5 MG TABS tablet Take 1 tablet (5 mg total) by mouth 2 (two) times daily.   atorvastatin (LIPITOR) 40 MG  tablet TAKE 1 TABLET BY MOUTH EVERY DAY   Coenzyme Q10 (COQ10) 200 MG CAPS Take 200 mg by mouth every evening.   empagliflozin (JARDIANCE) 25 MG TABS tablet TAKE ONE TABLET BY MOUTH DAILY BEFORE BREAKFAST   ezetimibe (ZETIA) 10 MG tablet Take 1 tablet (10 mg total) by mouth daily.   Melatonin 5 MG TABS Take 5 mg by mouth at bedtime.   sildenafil (VIAGRA) 100 MG tablet Take 0.5-1 tablets (50-100 mg total) by mouth daily as needed for erectile dysfunction.   valACYclovir (VALTREX) 1000 MG tablet TAKE 2 TABLETS (2,000 MG TOTAL) BY MOUTH TWICE A DAY   [DISCONTINUED] metoprolol succinate (TOPROL-XL) 25 MG 24 hr tablet TAKE 1/2 TABLET BY MOUTH DAILY   [DISCONTINUED] sacubitril-valsartan (ENTRESTO) 49-51 MG TAKE 1 TABLET BY MOUTH TWICE A DAY    Allergies:   Aldactone [spironolactone], Nsaids, Crestor [rosuvastatin calcium], and Penicillins   Social History   Socioeconomic History   Marital status: Married    Spouse name: Not on file   Number of children: 2   Years of education: Not on file   Highest education level: Not on file  Occupational History   Occupation: retired  Tobacco Use   Smoking status: Former    Current packs/day: 0.00    Average packs/day: 2.0 packs/day for 10.0 years (20.0 ttl pk-yrs)    Types: Cigarettes    Start date: 03/17/1960    Quit date: 03/17/1970    Years since quitting: 53.3   Smokeless tobacco: Never  Vaping Use   Vaping status: Never Used  Substance and Sexual Activity   Alcohol use: No    Comment: quit 1995   Drug use: No   Sexual activity: Yes  Other Topics Concern   Not on file  Social History Narrative   Lives in [place] with spouse.  2 Grown children.  Retired from [employer]   Married x 32 years in 2022.   Social Drivers of Corporate investment banker Strain: Low Risk  (07/10/2022)   Overall Financial Resource Strain (CARDIA)    Difficulty of Paying Living Expenses: Not hard at all  Food Insecurity: No Food Insecurity  (07/10/2022)   Hunger Vital Sign    Worried About Running Out of Food in the Last Year: Never true    Ran Out of Food in the Last Year: Never true  Transportation Needs: No Transportation Needs (07/10/2022)   PRAPARE - Administrator, Civil Service (Medical): No    Lack of Transportation (Non-Medical): No  Physical Activity: Sufficiently Active (07/10/2022)   Exercise Vital Sign    Days of Exercise per Week: 3 days    Minutes of Exercise per Session: 120 min  Stress: Stress Concern Present (07/10/2022)   [employer] of Occupational Health -  Occupational Stress Questionnaire    Feeling of Stress : To some extent  Social Connections: Moderately Integrated (07/10/2022)   Social Connection and Isolation Panel [NHANES]    Frequency of Communication with Friends and Family: Once a week    Frequency of Social Gatherings with Friends and Family: Once a week    Attends Religious Services: More than 4 times per year    Active Member of [employer] or Organizations: Yes    Attends Engineer, structural: More than 4 times per year    Marital Status: Married    SOCIAL:  Going back to 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 There is no history of Colon cancer, Stomach cancer, Rectal cancer, Esophageal cancer, or Liver cancer.  ROS:   Please see the history of present illness.     EKGs/Labs/Other Studies Reviewed:    Cardiac Studies & Procedures   ______________________________________________________________________________________________ CARDIAC CATHETERIZATION  CARDIAC CATHETERIZATION 08/26/2018  Narrative 1.  Nonobstructive coronary artery disease with patency of the left main stem, patency  of the LAD with minor nonobstructive disease, patency of the circumflex/intermediate, severe stenosis at the ostium of the small distal diagonal branch, and mild nonobstructive disease of a large, dominant RCA 2.  Normal intracardiac hemodynamics in both the right and left heart with normal/low intracardiac filling pressures suggestive of well compensated chronic systolic heart failure  Recommendations: Ongoing medical management  Findings Coronary Findings Diagnostic  Dominance: Right  Left Anterior Descending Mid LAD lesion is 30% stenosed.  Third Diagonal Branch 3rd Diag lesion is 80% stenosed. very small vessel not suitable for PCI  Ramus Intermedius The vessel exhibits minimal luminal irregularities.  Left Circumflex The vessel exhibits minimal luminal irregularities.  First Obtuse Marginal Branch The vessel exhibits minimal luminal irregularities.  Right Coronary Artery Large, dominant vessel with no obstructive disease.  The PDA has no significant stenosis.  The PLA branches supply large territory myocardium and essentially reach the apex. Prox RCA lesion is 50% stenosed. Mid RCA lesion is 30% stenosed.  Intervention  No interventions have been documented.   STRESS TESTS  MYOCARDIAL PERFUSION IMAGING 05/21/2018  Narrative  Nuclear stress EF: 48%.  The left ventricular ejection fraction is mildly decreased (45-54%).  Blood pressure demonstrated a normal response to exercise.  There was no ST segment deviation noted during stress.  No T wave inversion was noted during stress.  Defect 1: There is a large defect of severe severity.  Findings consistent with prior myocardial infarction.  This is an intermediate risk study.  Large size, severe intensity fixed apical, inferoapical, inferoseptal, septal and anteroseptal perfusion defect suggestive of scar. No significant reversible ischemia. LVEF 48% with inferoseptal, anteroseptal, inferoapical and apical  akinesis. This is an intermediate risk study.   ECHOCARDIOGRAM  ECHOCARDIOGRAM COMPLETE 11/15/2020  Narrative ECHOCARDIOGRAM REPORT    Patient Name:   Selinda Michaels Date of Exam: 11/15/2020 Medical Rec #:  [id number]       Height:       70.0 in Accession #:    [id number]      Weight:       166.0 lb Date of Birth:  April 06, 1940       BSA:  1.928 m Patient Age:    80 years        BP:           102/68 mmHg Patient Gender: M               HR:           66 bpm. Exam Location:  [address]  Procedure: 2D Echo, Cardiac Doppler and Color Doppler  Indications:    I48.4 Atrial Flutter  History:        Patient has prior history of Echocardiogram examinations, most recent 11/08/2019. CAD, TIA, Arrythmias:Atrial Flutter; Risk Factors:Hypertension, Dyslipidemia and Former Smoker. Heart Failure with Reduced Ejection Fraction.  Sonographer:    Farrel Conners RDCS Referring Phys: [id number] [facility] A Giovanni Biby  IMPRESSIONS   1. Left ventricular ejection fraction, by estimation, is 35 to 40%. The left ventricle has moderately decreased function. The left ventricle has no regional wall motion abnormalities. Left ventricular diastolic function could not be evaluated. Elevated left ventricular end-diastolic pressure. There is akinesis of the left ventricular, apical anterior wall, inferior wall and inferolateral wall. There is akinesis of the left ventricular, apical segment. There is severe hypokinesis of the left ventricular, basal-mid anteroseptal wall. There is hypokinesis of the left ventricular, basal-mid inferoseptal wall. 2. Right ventricular systolic function is normal. The right ventricular size is normal. There is normal pulmonary artery systolic pressure. The estimated right ventricular systolic pressure is 15.6 mmHg. 3. Left atrial size was severely dilated. 4. Right atrial size was severely dilated. 5. The mitral valve is normal in structure. Trivial mitral valve regurgitation.  No evidence of mitral stenosis. 6. The aortic valve is tricuspid. Aortic valve regurgitation is trivial. Mild aortic valve sclerosis is present, with no evidence of aortic valve stenosis. 7. Aortic dilatation noted. There is borderline dilatation of the aortic root, measuring 37 mm. There is mild dilatation of the ascending aorta, measuring 38 mm. 8. The inferior vena cava is normal in size with <50% respiratory variability, suggesting right atrial pressure of 8 mmHg.  Comparison(s): Prior EF 30%.  FINDINGS Left Ventricle: Left ventricular ejection fraction, by estimation, is 35 to 40%. The left ventricle has moderately decreased function. The left ventricle has no regional wall motion abnormalities. Severe hypokinesis of the left ventricular, basal-mid anteroseptal wall. The left ventricular internal cavity size was normal in size. There is no left ventricular hypertrophy. Left ventricular diastolic function could not be evaluated due to atrial fibrillation. Left ventricular diastolic function could not be evaluated. Elevated left ventricular end-diastolic pressure.  Right Ventricle: The right ventricular size is normal. No increase in right ventricular wall thickness. Right ventricular systolic function is normal. There is normal pulmonary artery systolic pressure. The tricuspid regurgitant velocity is 1.38 m/s, and with an assumed right atrial pressure of 8 mmHg, the estimated right ventricular systolic pressure is 15.6 mmHg.  Left Atrium: Left atrial size was severely dilated.  Right Atrium: Right atrial size was severely dilated.  Pericardium: There is no evidence of pericardial effusion.  Mitral Valve: The mitral valve is normal in structure. Trivial mitral valve regurgitation. No evidence of mitral valve stenosis.  Tricuspid Valve: The tricuspid valve is normal in structure. Tricuspid valve regurgitation is trivial. No evidence of tricuspid stenosis.  Aortic Valve: The aortic valve is  tricuspid. Aortic valve regurgitation is trivial. Mild aortic valve sclerosis is present, with no evidence of aortic valve stenosis.  Pulmonic Valve: The pulmonic valve was normal in structure. Pulmonic valve regurgitation is trivial. No  evidence of pulmonic stenosis.  Aorta: Aortic dilatation noted. There is borderline dilatation of the aortic root, measuring 37 mm. There is mild dilatation of the ascending aorta, measuring 38 mm.  Venous: The inferior vena cava is normal in size with less than 50% respiratory variability, suggesting right atrial pressure of 8 mmHg.  IAS/Shunts: No atrial level shunt detected by color flow Doppler.   LEFT VENTRICLE PLAX 2D LVIDd:         4.50 cm  Diastology LVIDs:         2.70 cm  LV e' medial:    2.99 cm/s LV PW:         0.90 cm  LV E/e' medial:  25.0 LV IVS:        1.10 cm  LV e' lateral:   10.20 cm/s LVOT diam:     2.30 cm  LV E/e' lateral: 7.3 LV SV:         49 LV SV Index:   26 LVOT Area:     4.15 cm   RIGHT VENTRICLE RV S prime:     10.82 cm/s TAPSE (M-mode): 2.0 cm  LEFT ATRIUM              Index       RIGHT ATRIUM           Index LA diam:        3.80 cm  1.97 cm/m  RA Area:     28.70 cm LA Vol (A2C):   122.0 ml 63.26 ml/m RA Volume:   97.40 ml  50.51 ml/m LA Vol (A4C):   73.1 ml  37.91 ml/m LA Biplane Vol: 97.0 ml  50.30 ml/m AORTIC VALVE LVOT Vmax:   60.70 cm/s LVOT Vmean:  40.200 cm/s LVOT VTI:    0.119 m  AORTA Ao Root diam: 3.70 cm Ao Asc diam:  3.95 cm  MITRAL VALVE               TRICUSPID VALVE MV Area (PHT)  cm         TR Peak grad:   7.6 mmHg MV Decel Time: 155 msec    TR Vmax:        138.00 cm/s MV E velocity: 74.80 cm/s MV A velocity: 35.35 cm/s  SHUNTS MV E/A ratio:  2.12        Systemic VTI:  0.12 m Systemic Diam: 2.30 cm  Armanda Magic MD Electronically signed by Armanda Magic MD Signature Date/Time: 11/15/2020/9:45:14 AM    Final        CARDIAC MRI  MR CARDIAC MORPHOLOGY W WO CONTRAST  01/18/2020  Addendum 01/18/2020  3:43 PM ADDENDUM REPORT: 01/18/2020 15:41  ADDENDUM: Correcting report: LVEF from the above is 43%. This correlates with mild reduction in left ventricular function. No change in the measurements or analysis of study.  Riley Lam MD   Electronically Signed By: Marlan Palau MD On: 01/18/2020 15:41  Narrative CLINICAL DATA:  83 year old white male. Study assumes a hematocrit of 44  EXAM: CARDIAC MRI  TECHNIQUE: The patient was scanned on a 1.5 Tesla GE magnet. A dedicated cardiac coil was used. Functional imaging was done using Fiesta sequences. 2,3, and 4 chamber views were done to assess for RWMA's. Modified Simpson's rule using a short axis stack was used to calculate an ejection fraction on a dedicated work Research officer, trade union. The patient received 8 cc of Gadavist. After 10 minutes inversion recovery sequences were used  to assess for infiltration and scar tissue.  CONTRAST:  8 cc  of Gadavist  FINDINGS: 1. Normal left ventricular size, thickness and systolic function (LVEF =43%). There is anteroseptal and inferoseptal (base) hypokinesis with apical hypokinesis. There is no evidence of LVOT obstruction; supine study. There is late gadolinium enhancement in the left ventricular myocardium in the anteroseptal base, and in the anterior, anteroseptal, inferoseptal, and inferior mid segments. There is apical, transmural late gadolinium enhancement.  Slight nonspecific T1 elevation, 1100 in the apex greatest.  LVEDD: 48 mm  IVSD: 11 mm  PWT: 8 mm  Septal/Posterior ratio 1.22  LVESD: 33mm  LVEDVi: 63 ml/m2  SVi: 27 ml/m2  CI- 1.43 L/m/m2  Myocardial mass/BSA: 59 g/m2  2. Normal right ventricular size, thickness and systolic function (RVEF = 58%). There are no regional wall motion abnormalities.  RVEDVi 49 ml/m2.  3.  Normal left atrial size and right atrial size.  LAESV - 22 mL  RAESV 72  mL  4. Normal size of the aortic root (33 mm), ascending aorta and pulmonary artery.  5. No significant valvular abnormalities. Trace, central aortic regurgitation noted.  6.  Normal pericardium.  No pericardial effusion.  7.  Grossly, no extra cardiac findings.  IMPRESSION: There is regional LV dysfunction in the anteroseptal, inferoseptal, and apical regions with concurrent areas of late gadolinium enhancement. LVEF 43%.  Lack of biatrial enlargement and lack of atrial sparing make cardiac amyloidosis less likely.  Normal myocardial mass index with septal/posterior ratio < 1.5. This makes hypertrophic cardiomyopathy less likely.  Differential includes coronary artery disease (septal perforator occlusion).  Elienai Gailey  Engineer, manufacturing  Electronically Signed: By: Marlan Palau MD On: 01/18/2020 13:41  PYP SCAN  MYOCARDIAL AMYLOID PLANAR AND SPECT 12/14/2019  Narrative Conclusion  Visual quantitative assessment is Grade 2 (suggestive of amyloid), however -- H/CL ratio is 1.3  3) Equivocal for TTR amyloidosis (Equivocal: A semi-quantitative visual score of 1 or H/CL ratio 1-1.5)**  Consider cMRI to further evaluate for TTR amyloid  **If echo/CMR are strongly positive, and negative, consider further evaluation including endomyocardial biopsy  (Note: A negative or mildly positive PYP does not exclude AL amyloid. In addition, equivocal results could represent AL amyloid or early TTR amyloid)  20mTechnetium-Pyrophosphate Imaging  for Transthyretin Cardiac Amyloidosis - [facility] Practice Points, Zachery Dakins, et. Al.  ______________________________________________________________________________________________        Recent Labs: 07/30/2022: ALT 32; BUN 23; Creat 1.17; Hemoglobin 15.0; Platelets 158; Potassium 4.9; Sodium 139; TSH 4.13  Recent Lipid Panel    Component Value Date/Time   CHOL 112 07/30/2022 0859   CHOL 138 11/15/2019 0922   TRIG 48 07/30/2022  0859   HDL 60 07/30/2022 0859   HDL 63 11/15/2019 0922   CHOLHDL 1.9 07/30/2022 0859   VLDL 19 11/03/2016 0841   LDLCALC 38 07/30/2022 0859    Physical Exam:    VS:  BP 112/62 (BP Location: Left Arm)   Pulse (!) 47   Ht 5\' 10"  (1.778 m)   Wt 78.2 kg   SpO2 98%   BMI 24.74 kg/m     Wt Readings from Last 3 Encounters:  06/25/23 78.2 kg  08/05/22 76.3 kg  07/10/22 78 kg   Gen: No distress  Neck: No JVD Ears: Bilateral Homero Fellers Sign Cardiac: No Rubs or Gallops, no murmur, Irregular bradycardia +2 radial pulses Respiratory: Clear to auscultation bilaterally, normal effort, normal respiratory rate GI: Soft, nontender, non-distended  MS: No edema;  moves all extremities  Integument: Skin feels warm Neuro:  At time of evaluation, alert and oriented to person/place/time/situation  Psych: Normal affect, patient feels well   ASSESSMENT:    1. Atypical atrial flutter (HCC)   2. HFrEF (heart failure with reduced ejection fraction) (HCC)   3. Coronary artery disease involving native coronary artery of native heart without angina pectoris   4. Hx-TIA (transient ischemic attack)   5. Essential hypertension   6. Chronic systolic CHF (congestive heart failure) (HCC)      PLAN:    Atrial Fibrillation with Slow Ventricular Response Longstanding persistent atrial fibrillation with slow ventricular response. Asymptomatic and tolerating current management well. Pulse field ablation was considered but deferred due to asymptomatic status and insufficient data on efficacy for this patient. The procedure is noted for improved safety due to reduced risk of damage to other tissues, but its efficacy is not well established for this patient. Continued confidence in Eliquis for stroke prevention (prior history of stroke) - Continue Eliquis for stroke prevention - Send courtesy message to Dr. Lalla Brothers regarding current status - Monitor for symptoms of rapid ventricular response or other changes - stop  BB  Heart Failure with Reduced Ejection Fraction Heart failure with moderately reduced ejection fraction. Currently asymptomatic with no dyspnea or edema. Maintains an active lifestyle and reports feeling well. Decision to discontinue metoprolol due to bradycardia (heart rate of 47 bpm), with plan to monitor for any adverse symptoms post-discontinuation. - NYHA Class I, Euvolemic - complicated by CKD stage IIIA; HTN controlled on ARNI - Refill Entresto - Discontinue metoprolol due to bradycardia (heart rate of 47 bpm) - Monitor for symptoms such as dyspnea or fatigue after discontinuation of metoprolol - Consider reintroduction of metoprolol if symptoms worsen  Coronary Artery Disease Obstructive coronary artery disease managed since 2021. No current angina or other related symptoms. Active and reports good physical condition. - Continue current management and lifestyle modifications - LDL at goal  Follow-up Well-managed and asymptomatic with current management. Regular follow-up with primary care and cardiology is planned. - Schedule follow-up appointment in one year - Ensure primary care appointment in May includes blood count checks  Prior diagnosis of borderline aortic dilation  - MRI- AA 33 mm.  Conservative monitoring at this time  One year me or APP  Medication Adjustments/Labs and Tests Ordered: Current medicines are reviewed at length with the patient today.  Concerns regarding medicines are outlined above.  Orders Placed This Encounter  Procedures   EKG 12-Lead    Meds ordered this encounter  Medications   sacubitril-valsartan (ENTRESTO) 49-51 MG    Sig: Take 1 tablet by mouth 2 (two) times daily.    Dispense:  180 tablet    Refill:  3    Pt must keep upcoming followup appt with Cardiology for any more refills. FINAL attempt Thank You     Patient Instructions  Medication Instructions:  Your physician has recommended you make the following change in your  medication:   1) STOP metoprolol  Your Sherryll Burger was refilled today.  *If you need a refill on your cardiac medications before your next appointment, please call your pharmacy*  Follow-Up: At [facility], you and your health needs are our priority.  As part of our continuing mission to provide you with exceptional heart care, we have created designated Provider Care Teams.  These Care Teams include your primary Cardiologist (physician) and Advanced Practice Providers (APPs -  Physician Assistants and Nurse Practitioners) who all work together to provide  you with the care you need, when you need it.  Your next appointment:   1 year(s)  The format for your next appointment:   In Person  Provider:   Christell Constant, MD {  Other Instructions   1st Floor: - Lobby - Registration  - Pharmacy  - Lab - Cafe  2nd Floor: - PV Lab - Diagnostic Testing (echo, CT, nuclear med)  3rd Floor: - Vacant  4th Floor: - TCTS (cardiothoracic surgery) - [facility] - Structural Heart Clinic - Vascular Surgery  - Vascular Ultrasound  5th Floor: - HeartCare Cardiology (general and EP) - Clinical Pharmacy for coumadin, hypertension, lipid, weight-loss medications, and med management appointments    Valet parking services will be available as well.      Signed, Christell Constant, MD  06/25/2023 9:44 AM    [facility]

## 2023-07-16 ENCOUNTER — Ambulatory Visit: Payer: PPO

## 2023-09-04 ENCOUNTER — Other Ambulatory Visit: Payer: Self-pay

## 2023-09-04 DIAGNOSIS — I10 Essential (primary) hypertension: Secondary | ICD-10-CM

## 2023-09-04 DIAGNOSIS — I484 Atypical atrial flutter: Secondary | ICD-10-CM

## 2023-09-04 MED ORDER — APIXABAN 5 MG PO TABS: 5.0000 mg | ORAL_TABLET | Freq: Two times a day (BID) | ORAL | 0 refills | Status: DC

## 2023-09-04 NOTE — Telephone Encounter (Signed)
 Prescription refill request for Eliquis  received. Indication:aflutter Last office visit:4/25 ZOX:WRUEA labs Age: 83 Weight:78.2  kg  Prescription refilled

## 2023-09-06 ENCOUNTER — Encounter: Payer: Self-pay | Admitting: Internal Medicine

## 2023-09-06 DIAGNOSIS — I484 Atypical atrial flutter: Secondary | ICD-10-CM

## 2023-09-07 ENCOUNTER — Telehealth: Payer: Self-pay

## 2023-09-07 NOTE — Telephone Encounter (Signed)
 Lpm that he needs labs drawn to refill Eliquis  medication.  The orders are in and released for [facility]

## 2023-09-11 ENCOUNTER — Telehealth: Payer: Self-pay

## 2023-09-11 NOTE — Telephone Encounter (Signed)
 Copied from CRM [id number]. Topic: Clinical - Request for Lab/Test Order >> Sep 11, 2023  4:08 PM Avram MATSU wrote: Reason for CRM: patient is request for an order labs to put in before his appt on 7/29

## 2023-09-16 ENCOUNTER — Other Ambulatory Visit

## 2023-09-16 DIAGNOSIS — I10 Essential (primary) hypertension: Secondary | ICD-10-CM

## 2023-09-16 DIAGNOSIS — I2510 Atherosclerotic heart disease of native coronary artery without angina pectoris: Secondary | ICD-10-CM

## 2023-09-16 DIAGNOSIS — I5022 Chronic systolic (congestive) heart failure: Secondary | ICD-10-CM | POA: Diagnosis not present

## 2023-09-16 DIAGNOSIS — N400 Enlarged prostate without lower urinary tract symptoms: Secondary | ICD-10-CM | POA: Diagnosis not present

## 2023-09-16 DIAGNOSIS — R5383 Other fatigue: Secondary | ICD-10-CM | POA: Diagnosis not present

## 2023-09-16 DIAGNOSIS — E782 Mixed hyperlipidemia: Secondary | ICD-10-CM | POA: Diagnosis not present

## 2023-09-16 LAB — COMPLETE METABOLIC PANEL WITHOUT GFR
AG Ratio: 2.3 (calc) (ref 1.0–2.5)
ALT: 26 U/L (ref 9–46)
AST: 27 U/L (ref 10–35)
Albumin: 4.3 g/dL (ref 3.6–5.1)
Alkaline phosphatase (APISO): 28 U/L — ABNORMAL LOW (ref 35–144)
BUN/Creatinine Ratio: 22 (calc) (ref 6–22)
BUN: 28 mg/dL — ABNORMAL HIGH (ref 7–25)
CO2: 26 mmol/L (ref 20–32)
Calcium: 9.2 mg/dL (ref 8.6–10.3)
Chloride: 108 mmol/L (ref 98–110)
Creat: 1.28 mg/dL — ABNORMAL HIGH (ref 0.70–1.22)
Globulin: 1.9 g/dL (ref 1.9–3.7)
Glucose, Bld: 96 mg/dL (ref 65–99)
Potassium: 4.5 mmol/L (ref 3.5–5.3)
Sodium: 137 mmol/L (ref 135–146)
Total Bilirubin: 0.9 mg/dL (ref 0.2–1.2)
Total Protein: 6.2 g/dL (ref 6.1–8.1)

## 2023-09-16 LAB — LIPID PANEL
Cholesterol: 110 mg/dL (ref <200)
HDL: 60 mg/dL (ref >=40)
LDL Cholesterol (Calc): 37 mg/dL
Non-HDL Cholesterol (Calc): 50 mg/dL (ref <130)
Total CHOL/HDL Ratio: 1.8 (calc) (ref <5.0)
Triglycerides: 46 mg/dL (ref <150)

## 2023-09-16 LAB — CBC WITH DIFFERENTIAL/PLATELET
Absolute Lymphocytes: 2285 {cells}/uL (ref 850–3900)
Absolute Monocytes: 748 {cells}/uL (ref 200–950)
Basophils Absolute: 48 {cells}/uL (ref 0–200)
Basophils Relative: 0.7 %
Eosinophils Absolute: 129 {cells}/uL (ref 15–500)
Eosinophils Relative: 1.9 %
HCT: 47.1 % (ref 38.5–50.0)
Hemoglobin: 15 g/dL (ref 13.2–17.1)
MCH: 30.9 pg (ref 27.0–33.0)
MCHC: 31.8 g/dL — ABNORMAL LOW (ref 32.0–36.0)
MCV: 97.1 fL (ref 80.0–100.0)
MPV: 11.4 fL (ref 7.5–12.5)
Monocytes Relative: 11 %
Neutro Abs: 3590 {cells}/uL (ref 1500–7800)
Neutrophils Relative %: 52.8 %
Platelets: 148 10*3/uL (ref 140–400)
RBC: 4.85 10*6/uL (ref 4.20–5.80)
RDW: 12.6 % (ref 11.0–15.0)
Total Lymphocyte: 33.6 %
WBC: 6.8 10*3/uL (ref 3.8–10.8)

## 2023-09-16 LAB — PSA: PSA: 2.59 ng/mL (ref <=4.00)

## 2023-09-16 LAB — TSH: TSH: 3.83 m[IU]/L (ref 0.40–4.50)

## 2023-09-17 ENCOUNTER — Ambulatory Visit: Payer: Self-pay | Admitting: Family Medicine

## 2023-09-22 ENCOUNTER — Telehealth: Payer: Self-pay | Admitting: Family Medicine

## 2023-09-22 ENCOUNTER — Other Ambulatory Visit: Payer: Self-pay

## 2023-09-22 DIAGNOSIS — E782 Mixed hyperlipidemia: Secondary | ICD-10-CM

## 2023-09-22 MED ORDER — ATORVASTATIN CALCIUM 40 MG PO TABS: 40.0000 mg | ORAL_TABLET | Freq: Every day | ORAL | 1 refills | Status: DC

## 2023-09-22 MED ORDER — APIXABAN 5 MG PO TABS: 5.0000 mg | ORAL_TABLET | Freq: Two times a day (BID) | ORAL | 1 refills | Status: DC

## 2023-09-22 NOTE — Telephone Encounter (Signed)
 Med sent.

## 2023-09-22 NOTE — Telephone Encounter (Signed)
 Prescription Request  09/22/2023  LOV: Visit date not found  What is the name of the medication or equipment? atorvastatin  (LIPITOR) 40 MG tablet   Have you contacted your pharmacy to request a refill? Yes   Which pharmacy would you like this sent to?  [id number] [address], [place] - [address] [place]  [address] Phone: [phone] Fax: [phone]    Patient notified that their request is being sent to the clinical staff for review and that they should receive a response within 2 business days.   Please advise at [facility] [phone]

## 2023-10-06 DIAGNOSIS — L570 Actinic keratosis: Secondary | ICD-10-CM | POA: Diagnosis not present

## 2023-10-13 ENCOUNTER — Encounter: Payer: Self-pay | Admitting: Family Medicine

## 2023-10-13 ENCOUNTER — Ambulatory Visit: Admitting: Family Medicine

## 2023-10-13 VITALS — BP 120/64 | HR 78 | Temp 97.7°F | Ht 70.0 in | Wt 166.0 lb

## 2023-10-13 DIAGNOSIS — I5022 Chronic systolic (congestive) heart failure: Secondary | ICD-10-CM | POA: Diagnosis not present

## 2023-10-13 DIAGNOSIS — Z0001 Encounter for general adult medical examination with abnormal findings: Secondary | ICD-10-CM

## 2023-10-13 DIAGNOSIS — I4891 Unspecified atrial fibrillation: Secondary | ICD-10-CM | POA: Diagnosis not present

## 2023-10-13 DIAGNOSIS — Z0000 Encounter for general adult medical examination without abnormal findings: Secondary | ICD-10-CM

## 2023-10-13 DIAGNOSIS — I2510 Atherosclerotic heart disease of native coronary artery without angina pectoris: Secondary | ICD-10-CM | POA: Diagnosis not present

## 2023-10-13 DIAGNOSIS — I10 Essential (primary) hypertension: Secondary | ICD-10-CM

## 2023-10-13 NOTE — Progress Notes (Signed)
 Subjective:    Patient ID: [name], male    DOB: 24-Sep-1940, 83 y.o.   MRN: [id number]   Patient is a very pleasant 83 year old Caucasian male with a history of atrial flutter status post ablation x2, nonobstructive coronary artery disease, and congestive heart failure with an ejection fraction of 35% on echocardiogram in 2020.  Repeat echo 8/21 showed EF of 30%.  Echocardiogram in 2022 showed ejection fraction had improved to 40%.  Patient is here today for complete physical exam.  Today on exam, the patient is in atrial fibrillation.  He does not feel.  He denies any syncope or near syncope.  Due to his age he does not require colonoscopy.  He did have a PSA checked on his recent lab work.  I recommended against further prostate cancer screening however fortunately his PSA was well within normal limits.  He is due for a tetanus shot.  He is due for RSV.  He is due for a flu and COVID shot in the fall.  Otherwise he is doing well.  In fact he is going to play golf later today.  He denies any chest pain or shortness of breath.  There is no evidence of fluid overload on exam.  Past Medical History:  Diagnosis Date   Atrial flutter (HCC)    a. s/p CTI ablation x2 by Dr Kelsie 10/12, 9/13   Basal cell carcinoma of cheek    BPH (benign prostatic hyperplasia)    CAD (coronary artery disease)    nonobstructive on cath 08/2018   CHF (congestive heart failure) (HCC)    ef 35% on Echo 2020   CKD (chronic kidney disease) stage 3, GFR 30-59 ml/min (HCC)    Colon polyps    Diverticulosis    Ectopic atrial tachycardia (HCC)    a. Dx 07/2015: rate controlled    Hemorrhoids    Hyperlipidemia    Hypertension    Hypertrophic cardiomyopathy (HCC)    TIA (transient ischemic attack)    Past Surgical History:  Procedure Laterality Date   ATRIAL ABLATION SURGERY  10/12   CTI ablation for atrial flutter by Dr Kelsie   ATRIAL FLUTTER ABLATION N/A 12/11/2011   CTI ablation for atrial flutter by Dr  Kelsie   BASAL CELL CARCINOMA EXCISION  ~ 1973   left cheek   CARDIAC CATHETERIZATION  1960's   CARDIOVERSION N/A 11/27/2020   Procedure: CARDIOVERSION;  Surgeon: Lonni Slain, MD;  Location: [facility] ENDOSCOPY;  Service: Cardiovascular;  Laterality: N/A;   HERNIA REPAIR  ~ 1972   umbilical   JOINT REPLACEMENT     RIGHT/LEFT HEART CATH AND CORONARY ANGIOGRAPHY N/A 08/26/2018   Procedure: RIGHT/LEFT HEART CATH AND CORONARY ANGIOGRAPHY;  Surgeon: Wonda Ozell, MD;  Location: [facility] INVASIVE CV LAB;  Service: Cardiovascular;  Laterality: N/A;   TONSILLECTOMY AND ADENOIDECTOMY  1947   TOTAL HIP ARTHROPLASTY     left   Current Outpatient Medications on File Prior to Visit  Medication Sig Dispense Refill   acetaminophen  (TYLENOL ) 325 MG tablet Take 325-650 mg by mouth every 6 (six) hours as needed for moderate pain or headache.     apixaban  (ELIQUIS ) 5 MG TABS tablet Take 1 tablet (5 mg total) by mouth 2 (two) times daily. 90 tablet 1   atorvastatin  (LIPITOR) 40 MG tablet Take 1 tablet (40 mg total) by mouth daily. 90 tablet 1   Coenzyme Q10 (COQ10) 200 MG CAPS Take 200 mg by mouth every evening.  empagliflozin  (JARDIANCE ) 25 MG TABS tablet TAKE ONE TABLET BY MOUTH DAILY BEFORE BREAKFAST 90 tablet 0   ezetimibe  (ZETIA ) 10 MG tablet Take 1 tablet (10 mg total) by mouth daily. 90 tablet 3   Melatonin 5 MG TABS Take 5 mg by mouth at bedtime.     sacubitril-valsartan (ENTRESTO ) 49-51 MG Take 1 tablet by mouth 2 (two) times daily. 180 tablet 3   sildenafil  (VIAGRA ) 100 MG tablet Take 0.5-1 tablets (50-100 mg total) by mouth daily as needed for erectile dysfunction. 5 tablet 11   valACYclovir  (VALTREX ) 1000 MG tablet TAKE 2 TABLETS (2,000 MG TOTAL) BY MOUTH TWICE A DAY 4 tablet 5   No current facility-administered medications on file prior to visit.   Allergies  Allergen Reactions   Aldactone  [Spironolactone ]     Hyperkalemia   Nsaids Other (See Comments)    GI  Upset    Crestor   [Rosuvastatin  Calcium ] Other (See Comments)    Joint pain   Penicillins Other (See Comments)    Unknown reaction Did it involve swelling of the face/tongue/throat, SOB, or low BP? Unknown Did it involve sudden or severe rash/hives, skin peeling, or any reaction on the inside of your mouth or nose? Unknown Did you need to seek medical attention at a hospital or doctor's office? Unknown When did it last happen? More than 50 years ago   If all above answers are "NO", may proceed with cephalosporin use.    Social History   Socioeconomic History   Marital status: Married    Spouse name: Not on file   Number of children: 2   Years of education: Not on file   Highest education level: Not on file  Occupational History   Occupation: retired  Tobacco Use   Smoking status: Former    Current packs/day: 0.00    Average packs/day: 2.0 packs/day for 10.0 years (20.0 ttl pk-yrs)    Types: Cigarettes    Start date: 03/17/1960    Quit date: 03/17/1970    Years since quitting: 53.6   Smokeless tobacco: Never  Vaping Use   Vaping status: Never Used  Substance and Sexual Activity   Alcohol use: No    Comment: quit 1995   Drug use: No   Sexual activity: Yes  Other Topics Concern   Not on file  Social History Narrative   Lives in [place] with spouse.  2 Grown children.  Retired from [employer]   Married x 32 years in 2022.   Social Drivers of Corporate investment banker Strain: Low Risk  (07/10/2022)   Overall Financial Resource Strain (CARDIA)    Difficulty of Paying Living Expenses: Not hard at all  Food Insecurity: No Food Insecurity (07/10/2022)   Hunger Vital Sign    Worried About Running Out of Food in the Last Year: Never true    Ran Out of Food in the Last Year: Never true  Transportation Needs: No Transportation Needs (07/10/2022)   PRAPARE - Administrator, Civil Service (Medical): No    Lack of Transportation (Non-Medical): No  Physical Activity:  Sufficiently Active (07/10/2022)   Exercise Vital Sign    Days of Exercise per Week: 3 days    Minutes of Exercise per Session: 120 min  Stress: Stress Concern Present (07/10/2022)   [employer] of Occupational Health - Occupational Stress Questionnaire    Feeling of Stress : To some extent  Social Connections: Moderately Integrated (07/10/2022)   Social Connection  and Isolation Panel    Frequency of Communication with Friends and Family: Once a week    Frequency of Social Gatherings with Friends and Family: Once a week    Attends Religious Services: More than 4 times per year    Active Member of [employer] or Organizations: Yes    Attends Engineer, structural: More than 4 times per year    Marital Status: Married  Catering manager Violence: Not At Risk (07/10/2022)   Humiliation, Afraid, Rape, and Kick questionnaire    Fear of Current or Ex-Partner: No    Emotionally Abused: No    Physically Abused: No    Sexually Abused: No   Family History  Problem Relation Age of Onset   Breast cancer Mother    Ulcers Father        stomach   COPD Father    Tuberculosis Father    Colon polyps Brother    Tuberculosis Brother    Brain cancer Brother    Colon cancer Neg Hx    Stomach cancer Neg Hx    Rectal cancer Neg Hx    Esophageal cancer Neg Hx    Liver cancer Neg Hx       Review of Systems  All other systems reviewed and are negative.      Objective:   Physical Exam Vitals reviewed.  Constitutional:      General: He is not in acute distress.    Appearance: He is well-developed. He is not diaphoretic.  HENT:     Head: Normocephalic and atraumatic.     Right Ear: External ear normal.     Left Ear: External ear normal.     Nose: Nose normal.     Mouth/Throat:     Pharynx: No oropharyngeal exudate.  Eyes:     General: No scleral icterus.       Right eye: No discharge.        Left eye: No discharge.     Conjunctiva/sclera: Conjunctivae normal.     Pupils: Pupils  are equal, round, and reactive to light.  Neck:     Thyroid : No thyromegaly.     Vascular: No JVD.     Trachea: No tracheal deviation.  Cardiovascular:     Rate and Rhythm: Normal rate and regular rhythm.     Heart sounds: Normal heart sounds. No murmur heard.    No friction rub. No gallop.  Pulmonary:     Effort: Pulmonary effort is normal. No respiratory distress.     Breath sounds: Normal breath sounds. No stridor. No wheezing or rales.  Chest:     Chest wall: No tenderness.  Abdominal:     General: Bowel sounds are normal. There is no distension.     Palpations: Abdomen is soft. There is no mass.     Tenderness: There is no abdominal tenderness. There is no guarding or rebound.  Musculoskeletal:        General: No tenderness. Normal range of motion.     Cervical back: Normal range of motion and neck supple.  Lymphadenopathy:     Cervical: No cervical adenopathy.  Skin:    General: Skin is warm.     Coloration: Skin is not pale.     Findings: No erythema or rash.  Neurological:     Mental Status: He is alert and oriented to person, place, and time.     Cranial Nerves: No cranial nerve deficit.     Motor: No abnormal muscle  tone.     Coordination: Coordination normal.     Deep Tendon Reflexes: Reflexes are normal and symmetric.  Psychiatric:        Behavior: Behavior normal.        Thought Content: Thought content normal.        Judgment: Judgment normal.      Lab on 09/16/2023  Component Date Value Ref Range Status   WBC 09/16/2023 6.8  3.8 - 10.8 Thousand/uL Final   RBC 09/16/2023 4.85  4.20 - 5.80 Million/uL Final   Hemoglobin 09/16/2023 15.0  13.2 - 17.1 g/dL Final   HCT 92/97/7974 47.1  38.5 - 50.0 % Final   MCV 09/16/2023 97.1  80.0 - 100.0 fL Final   MCH 09/16/2023 30.9  27.0 - 33.0 pg Final   MCHC 09/16/2023 31.8 (L)  32.0 - 36.0 g/dL Final   Comment: For adults, a slight decrease in the calculated MCHC value (in the range of 30 to 32 g/dL) is most  likely not clinically significant; however, it should be interpreted with caution in correlation with other red cell parameters and the patient's clinical condition.    RDW 09/16/2023 12.6  11.0 - 15.0 % Final   Platelets 09/16/2023 148  140 - 400 Thousand/uL Final   MPV 09/16/2023 11.4  7.5 - 12.5 fL Final   Neutro Abs 09/16/2023 3,590  1,500 - 7,800 cells/uL Final   Absolute Lymphocytes 09/16/2023 2,285  850 - 3,900 cells/uL Final   Absolute Monocytes 09/16/2023 748  200 - 950 cells/uL Final   Eosinophils Absolute 09/16/2023 129  15 - 500 cells/uL Final   Basophils Absolute 09/16/2023 48  0 - 200 cells/uL Final   Neutrophils Relative % 09/16/2023 52.8  % Final   Total Lymphocyte 09/16/2023 33.6  % Final   Monocytes Relative 09/16/2023 11.0  % Final   Eosinophils Relative 09/16/2023 1.9  % Final   Basophils Relative 09/16/2023 0.7  % Final   Glucose, Bld 09/16/2023 96  65 - 99 mg/dL Final   Comment: .            Fasting reference interval .    BUN 09/16/2023 28 (H)  7 - 25 mg/dL Final   Creat 92/97/7974 1.28 (H)  0.70 - 1.22 mg/dL Final   BUN/Creatinine Ratio 09/16/2023 22  6 - 22 (calc) Final   Sodium 09/16/2023 137  135 - 146 mmol/L Final   Potassium 09/16/2023 4.5  3.5 - 5.3 mmol/L Final   Chloride 09/16/2023 108  98 - 110 mmol/L Final   CO2 09/16/2023 26  20 - 32 mmol/L Final   Calcium  09/16/2023 9.2  8.6 - 10.3 mg/dL Final   Total Protein 92/97/7974 6.2  6.1 - 8.1 g/dL Final   Albumin 92/97/7974 4.3  3.6 - 5.1 g/dL Final   Globulin 92/97/7974 1.9  1.9 - 3.7 g/dL (calc) Final   AG Ratio 09/16/2023 2.3  1.0 - 2.5 (calc) Final   Total Bilirubin 09/16/2023 0.9  0.2 - 1.2 mg/dL Final   Alkaline phosphatase (APISO) 09/16/2023 28 (L)  35 - 144 U/L Final   AST 09/16/2023 27  10 - 35 U/L Final   ALT 09/16/2023 26  9 - 46 U/L Final   Cholesterol 09/16/2023 110  <200 mg/dL Final   HDL 92/97/7974 60  > OR = 40 mg/dL Final   Triglycerides 92/97/7974 46  <150 mg/dL Final   LDL  Cholesterol (Calc) 09/16/2023 37  mg/dL (calc) Final   Comment: Reference range: <100 .  Desirable range <100 mg/dL for primary prevention;   <70 mg/dL for patients with CHD or diabetic patients  with > or = 2 CHD risk factors. SABRA LDL-C is now calculated using the Martin-Hopkins  calculation, which is a validated novel method providing  better accuracy than the Friedewald equation in the  estimation of LDL-C.  Gladis APPLETHWAITE et al. SANDREA. 7986;689(80): 2061-2068  (http://education.[facility].com/faq/FAQ164)    Total CHOL/HDL Ratio 09/16/2023 1.8  <4.9 (calc) Final   Non-HDL Cholesterol (Calc) 09/16/2023 50  <130 mg/dL (calc) Final   Comment: For patients with diabetes plus 1 major ASCVD risk  factor, treating to a non-HDL-C goal of <100 mg/dL  (LDL-C of <29 mg/dL) is considered a therapeutic  option.    PSA 09/16/2023 2.59  < OR = 4.00 ng/mL Final   Comment: The total PSA value from this assay system is  standardized against the WHO standard. The test  result will be approximately 20% lower when compared  to the equimolar-standardized total PSA (Beckman  Coulter). Comparison of serial PSA results should be  interpreted with this fact in mind. . This test was performed using the Siemens  chemiluminescent method. Values obtained from  different assay methods cannot be used interchangeably. PSA levels, regardless of value, should not be interpreted as absolute evidence of the presence or absence of disease.    TSH 09/16/2023 3.83  0.40 - 4.50 mIU/L Final        Assessment & Plan:  General medical exam  Coronary artery disease involving native coronary artery of native heart without angina pectoris  Chronic systolic (congestive) heart failure (HCC)  Benign essential HTN  Atrial fibrillation, unspecified type [facility]) Patient is appropriately anticoagulated and his heart rate is controlled.  Therefore no changes in his medication for atrial fibrillation.  His blood pressure  is outstanding.  He is currently on Jardiance  and Entresto .  He is unable to tolerate spironolactone .  He denies any symptoms of shortness of breath or dyspnea on exertion orthopnea.  Patient's blood sugar and cholesterol are outstanding.  PSA is well within normal limits.  Recommended RSV vaccination.  Recommended flu and COVID shots in the fall.  Recommended a tetanus shot if he were to get a dirty cut.  The remainder of his preventative care is up-to-date

## 2023-11-18 ENCOUNTER — Ambulatory Visit

## 2023-11-18 VITALS — Ht 70.0 in | Wt 166.0 lb

## 2023-11-18 DIAGNOSIS — Z0000 Encounter for general adult medical examination without abnormal findings: Secondary | ICD-10-CM | POA: Diagnosis not present

## 2023-11-18 NOTE — Progress Notes (Signed)
 Subjective:   [name] is a 83 y.o. who presents for a Medicare Wellness preventive visit.  As a reminder, Annual Wellness Visits don't include a physical exam, and some assessments may be limited, especially if this visit is performed virtually. We may recommend an in-person follow-up visit with your provider if needed.  Visit Complete: Virtual I connected with  [name] on 11/18/23 by a audio enabled telemedicine application and verified that I am speaking with the correct person using two identifiers.  Patient Location: Home  Provider Location: Home Office  I discussed the limitations of evaluation and management by telemedicine. The patient expressed understanding and agreed to proceed.  Vital Signs: Because this visit was a virtual/telehealth visit, some criteria may be missing or patient reported. Any vitals not documented were not able to be obtained and vitals that have been documented are patient reported.  VideoDeclined- This patient declined Librarian, academic. Therefore the visit was completed with audio only.  Persons Participating in Visit: Patient.  AWV Questionnaire: No: Patient Medicare AWV questionnaire was not completed prior to this visit.  Cardiac Risk Factors include: advanced age (>40men, >6 women);dyslipidemia;male gender;hypertension     Objective:    Today's Vitals   11/18/23 1154  Weight: 166 lb (75.3 kg)  Height: 5' 10 (1.778 m)   Body mass index is 23.82 kg/m.     11/18/2023   12:06 PM 07/10/2022   11:13 AM 01/24/2021    8:24 AM 11/27/2020    7:32 AM 08/26/2018    6:14 AM 12/02/2013    5:03 PM 12/11/2011    5:41 PM  Advanced Directives  Does Patient Have a Medical Advance Directive? No Yes Yes Yes Yes No  Patient has advance directive, copy not in chart   Type of Advance Directive  Living will Healthcare Power of [place];Living will Healthcare Power of [place];Living will Healthcare Power of  [place];Living will    Does patient want to make changes to medical advance directive?  No - Patient declined   No - Patient declined     Copy of Healthcare Power of Attorney in Chart?   No - copy requested No - copy requested No - copy requested   Copy requested from other (Comment)   Would patient like information on creating a medical advance directive? Yes (MAU/Ambulatory/Procedural Areas - Information given)           Data saved with a previous flowsheet row definition    Current Medications (verified) Outpatient Encounter Medications as of 11/18/2023  Medication Sig   acetaminophen  (TYLENOL ) 325 MG tablet Take 325-650 mg by mouth every 6 (six) hours as needed for moderate pain or headache.   apixaban  (ELIQUIS ) 5 MG TABS tablet Take 1 tablet (5 mg total) by mouth 2 (two) times daily.   atorvastatin  (LIPITOR) 40 MG tablet Take 1 tablet (40 mg total) by mouth daily.   Coenzyme Q10 (COQ10) 200 MG CAPS Take 200 mg by mouth every evening.   empagliflozin  (JARDIANCE ) 25 MG TABS tablet TAKE ONE TABLET BY MOUTH DAILY BEFORE BREAKFAST   ezetimibe  (ZETIA ) 10 MG tablet Take 1 tablet (10 mg total) by mouth daily.   Melatonin 5 MG TABS Take 5 mg by mouth at bedtime.   sacubitril-valsartan (ENTRESTO ) 49-51 MG Take 1 tablet by mouth 2 (two) times daily.   sildenafil  (VIAGRA ) 100 MG tablet Take 0.5-1 tablets (50-100 mg total) by mouth daily as needed for erectile dysfunction.   valACYclovir  (VALTREX ) 1000  MG tablet TAKE 2 TABLETS (2,000 MG TOTAL) BY MOUTH TWICE A DAY   No facility-administered encounter medications on file as of 11/18/2023.    Allergies (verified) Aldactone  [spironolactone ], Nsaids, Crestor  [rosuvastatin  calcium ], and Penicillins   History: Past Medical History:  Diagnosis Date   Atrial flutter (HCC)    a. s/p CTI ablation x2 by Dr Kelsie 10/12, 9/13   Basal cell carcinoma of cheek    BPH (benign prostatic hyperplasia)    CAD (coronary artery disease)    nonobstructive on cath  08/2018   CHF (congestive heart failure) (HCC)    ef 35% on Echo 2020   CKD (chronic kidney disease) stage 3, GFR 30-59 ml/min (HCC)    Colon polyps    Diverticulosis    Ectopic atrial tachycardia (HCC)    a. Dx 07/2015: rate controlled    Hemorrhoids    Hyperlipidemia    Hypertension    Hypertrophic cardiomyopathy (HCC)    TIA (transient ischemic attack)    Past Surgical History:  Procedure Laterality Date   ATRIAL ABLATION SURGERY  10/12   CTI ablation for atrial flutter by Dr Kelsie   ATRIAL FLUTTER ABLATION N/A 12/11/2011   CTI ablation for atrial flutter by Dr Kelsie   BASAL CELL CARCINOMA EXCISION  ~ 1973   left cheek   CARDIAC CATHETERIZATION  1960's   CARDIOVERSION N/A 11/27/2020   Procedure: CARDIOVERSION;  Surgeon: Lonni Slain, MD;  Location: [facility] ENDOSCOPY;  Service: Cardiovascular;  Laterality: N/A;   HERNIA REPAIR  ~ 1972   umbilical   JOINT REPLACEMENT     RIGHT/LEFT HEART CATH AND CORONARY ANGIOGRAPHY N/A 08/26/2018   Procedure: RIGHT/LEFT HEART CATH AND CORONARY ANGIOGRAPHY;  Surgeon: Wonda Sharper, MD;  Location: [facility] INVASIVE CV LAB;  Service: Cardiovascular;  Laterality: N/A;   TONSILLECTOMY AND ADENOIDECTOMY  1947   TOTAL HIP ARTHROPLASTY     left   Family History  Problem Relation Age of Onset   Breast cancer Mother    Ulcers Father        stomach   COPD Father    Tuberculosis Father    Colon polyps Brother    Tuberculosis Brother    Brain cancer Brother    Colon cancer Neg Hx    Stomach cancer Neg Hx    Rectal cancer Neg Hx    Esophageal cancer Neg Hx    Liver cancer Neg Hx    Social History   Socioeconomic History   Marital status: Married    Spouse name: Not on file   Number of children: 2   Years of education: Not on file   Highest education level: Not on file  Occupational History   Occupation: retired  Tobacco Use   Smoking status: Former    Current packs/day: 0.00    Average packs/day: 2.0 packs/day for 10.0 years (20.0  ttl pk-yrs)    Types: Cigarettes    Start date: 03/17/1960    Quit date: 03/17/1970    Years since quitting: 53.7   Smokeless tobacco: Never  Vaping Use   Vaping status: Never Used  Substance and Sexual Activity   Alcohol use: No    Comment: quit 1995   Drug use: No   Sexual activity: Yes  Other Topics Concern   Not on file  Social History Narrative   Lives in [place] with spouse.  2 Grown children.  Retired from [employer]   Married x 32 years in 2022.   Social Drivers  of Health   Financial Resource Strain: Low Risk  (11/18/2023)   Overall Financial Resource Strain (CARDIA)    Difficulty of Paying Living Expenses: Not hard at all  Food Insecurity: No Food Insecurity (11/18/2023)   Hunger Vital Sign    Worried About Running Out of Food in the Last Year: Never true    Ran Out of Food in the Last Year: Never true  Transportation Needs: No Transportation Needs (11/18/2023)   PRAPARE - Administrator, Civil Service (Medical): No    Lack of Transportation (Non-Medical): No  Physical Activity: Sufficiently Active (11/18/2023)   Exercise Vital Sign    Days of Exercise per Week: 3 days    Minutes of Exercise per Session: 120 min  Stress: No Stress Concern Present (11/18/2023)   [employer] of Occupational Health - Occupational Stress Questionnaire    Feeling of Stress: Only a little  Social Connections: Moderately Integrated (11/18/2023)   Social Connection and Isolation Panel    Frequency of Communication with Friends and Family: Once a week    Frequency of Social Gatherings with Friends and Family: Once a week    Attends Religious Services: More than 4 times per year    Active Member of [employer] or Organizations: Yes    Attends Engineer, structural: More than 4 times per year    Marital Status: Married    Tobacco Counseling Counseling given: Not Answered    Clinical Intake:  Pre-visit preparation completed: Yes  Pain : No/denies  pain  Diabetes: No  Lab Results  Component Value Date   HGBA1C 6.1 (H) 12/03/2013     How often do you need to have someone help you when you read instructions, pamphlets, or other written materials from your doctor or pharmacy?: 1 - Never  Interpreter Needed?: No  Information entered by :: Charmaine Bloodgood LPN   Activities of Daily Living     11/18/2023   12:00 PM  In your present state of health, do you have any difficulty performing the following activities:  Hearing? 0  Vision? 0  Difficulty concentrating or making decisions? 0  Walking or climbing stairs? 0  Dressing or bathing? 0  Doing errands, shopping? 0  Preparing Food and eating ? N  Using the Toilet? N  In the past six months, have you accidently leaked urine? N  Do you have problems with loss of bowel control? N  Managing your Medications? N  Managing your Finances? N  Housekeeping or managing your Housekeeping? N    Patient Care Team: Duanne Butler DASEN, MD as PCP - General (Family Medicine) Cindie Ole DASEN, MD as PCP - Electrophysiology (Cardiology) Santo Stanly LABOR, MD as PCP - Cardiology (Cardiology) Pa, [facility] Elnor Rome BROCKS, MD as Consulting Physician (Dermatology)  I have updated your Care Teams any recent Medical Services you may have received from other providers in the past year.     Assessment:   This is a routine wellness examination for [name].  Hearing/Vision screen Hearing Screening - Comments::  hearing difficulties; wears bilateral hearing aids   Vision Screening - Comments:: Wears rx glasses - up to date with routine eye exams with [facility]    Goals Addressed             This Visit's Progress    Remain active and independent   On track      Depression Screen     11/18/2023   12:07 PM 10/13/2023  7:55 AM 07/10/2022   11:11 AM 10/22/2021    9:28 AM 01/24/2021    8:20 AM 07/10/2020    8:27 AM 05/27/2018    4:13 PM  PHQ 2/9  Scores  PHQ - 2 Score 0 0 0 0 0 0 0    Fall Risk     11/18/2023   12:07 PM 10/13/2023    7:55 AM 07/10/2022   11:07 AM 07/09/2022    2:03 PM 01/24/2021    8:24 AM  Fall Risk   Falls in the past year? 0 0 0 0 0  Number falls in past yr: 0 0 0  0  Injury with Fall? 0 0 0  0  Risk for fall due to : No Fall Risks No Fall Risks No Fall Risks  No Fall Risks  Follow up Falls prevention discussed;Education provided;Falls evaluation completed Falls evaluation completed Falls prevention discussed;Education provided;Falls evaluation completed  Falls prevention discussed      Data saved with a previous flowsheet row definition    MEDICARE RISK AT HOME:  Medicare Risk at Home Any stairs in or around the home?: No If so, are there any without handrails?: No Home free of loose throw rugs in walkways, pet beds, electrical cords, etc?: Yes Adequate lighting in your home to reduce risk of falls?: Yes Life alert?: No Use of a cane, walker or w/c?: No Grab bars in the bathroom?: Yes Shower chair or bench in shower?: No Elevated toilet seat or a handicapped toilet?: Yes  TIMED UP AND GO:  Was the test performed?  No  Cognitive Function: 6CIT completed        11/18/2023   12:08 PM 07/10/2022   11:14 AM  6CIT Screen  What Year? 0 points 0 points  What month? 0 points 0 points  What time? 0 points 0 points  Count back from 20 0 points 0 points  Months in reverse 0 points 0 points  Repeat phrase 0 points 0 points  Total Score 0 points 0 points    Immunizations Immunization History  Administered Date(s) Administered    sv, Bivalent, Protein Subunit Rsvpref,pf (Abrysvo) 03/21/2023   Fluad Quad(high Dose 65+) 12/02/2018, 12/19/2021   Hepatitis A 12/06/1997, 09/14/1998   Hepatitis B 12/06/1997, 01/05/1998, 09/07/1998   INFLUENZA, HIGH DOSE SEASONAL PF 12/31/2016, 12/17/2017, 01/10/2020, 12/14/2022   Influenza,inj,Quad PF,6+ Mos 12/28/2012, 01/03/2014, 12/27/2014, 12/18/2015   PFIZER(Purple  Top)SARS-COV-2 Vaccination 03/30/2019, 04/19/2019, 12/30/2019   Pfizer Covid-19 Vaccine Bivalent Booster 69yrs & up 12/19/2020   Pneumococcal Conjugate-13 12/27/2014   Pneumococcal-Unspecified 11/16/2010   Tdap 08/16/2011   Zoster Recombinant(Shingrix ) 03/05/2017, 05/11/2017   Zoster, Live 12/31/2010    Screening Tests Health Maintenance  Topic Date Due   Pneumococcal Vaccine: 50+ Years (2 of 2 - PPSV23, PCV20, or PCV21) 02/21/2015   DTaP/Tdap/Td (2 - Td or Tdap) 08/15/2021   INFLUENZA VACCINE  10/16/2023   COVID-19 Vaccine (5 - 2025-26 season) 11/16/2023   Medicare Annual Wellness (AWV)  11/17/2024   Zoster Vaccines- Shingrix   Completed   HPV VACCINES  Aged Out   Meningococcal B Vaccine  Aged Out   Hepatitis B Vaccines 19-59 Average Risk  Discontinued    Health Maintenance  Health Maintenance Due  Topic Date Due   Pneumococcal Vaccine: 50+ Years (2 of 2 - PPSV23, PCV20, or PCV21) 02/21/2015   DTaP/Tdap/Td (2 - Td or Tdap) 08/15/2021   INFLUENZA VACCINE  10/16/2023   COVID-19 Vaccine (5 - 2025-26 season) 11/16/2023   Health Maintenance  Items Addressed: Information provided on vaccine recommendations   Additional Screening:  Vision Screening: Recommended annual ophthalmology exams for early detection of glaucoma and other disorders of the eye. Would you like a referral to an eye doctor? No    Dental Screening: Recommended annual dental exams for proper oral hygiene  Community Resource Referral / Chronic Care Management: CRR required this visit?  No   CCM required this visit?  No   Plan:    I have personally reviewed and noted the following in the patient's chart:   Medical and social history Use of alcohol, tobacco or illicit drugs  Current medications and supplements including opioid prescriptions. Patient is not currently taking opioid prescriptions. Functional ability and status Nutritional status Physical activity Advanced directives List of other  physicians Hospitalizations, surgeries, and ER visits in previous 12 months Vitals Screenings to include cognitive, depression, and falls Referrals and appointments  In addition, I have reviewed and discussed with patient certain preventive protocols, quality metrics, and best practice recommendations. A written personalized care plan for preventive services as well as general preventive health recommendations were provided to patient.   Lavelle Pfeiffer [place], [place]   0/08/7972   After Visit Summary: (MyChart) Due to this being a telephonic visit, the after visit summary with patients personalized plan was offered to patient via MyChart   Notes: Nothing significant to report at this time.

## 2023-11-18 NOTE — Patient Instructions (Signed)
 Mr. Bostic , Thank you for taking time out of your busy schedule to complete your Annual Wellness Visit with me. I enjoyed our conversation and look forward to speaking with you again next year. I, as well as your care team,  appreciate your ongoing commitment to your health goals. Please review the following plan we discussed and let me know if I can assist you in the future. Your Game plan/ To Do List     Follow up Visits: We will see or speak with you next year for your Next Medicare AWV with our clinical staff Have you seen your provider in the last 6 months (3 months if uncontrolled diabetes)? Yes  Clinician Recommendations:  Aim for 30 minutes of exercise or brisk walking, 6-8 glasses of water, and 5 servings of fruits and vegetables each day.       This is a list of the screenings recommended for you:  Health Maintenance  Topic Date Due   Pneumococcal Vaccine for age over 62 (2 of 2 - PPSV23, PCV20, or PCV21) 02/21/2015   DTaP/Tdap/Td vaccine (2 - Td or Tdap) 08/15/2021   Flu Shot  10/16/2023   COVID-19 Vaccine (5 - 2025-26 season) 11/16/2023   Medicare Annual Wellness Visit  11/17/2024   Zoster (Shingles) Vaccine  Completed   HPV Vaccine  Aged Out   Meningitis B Vaccine  Aged Out   Hepatitis B Vaccine  Discontinued    Advanced directives: (ACP Link)Information on Advanced Care Planning can be found at [place]  Secretary of [facility] Advance Health Care Directives Advance Health Care Directives. [email or web address]   Advance Care Planning is important because it:  [x]  Makes sure you receive the medical care that is consistent with your values, goals, and preferences  [x]  It provides guidance to your family and loved ones and reduces their decisional burden about whether or not they are making the right decisions based on your wishes.  Follow the link provided in your after visit summary or read over the paperwork we have mailed to you to help you started getting your Advance  Directives in place. If you need assistance in completing these, please reach out to us  so that we can help you!  See attachments for Preventive Care and Fall Prevention Tips.

## 2023-12-09 DIAGNOSIS — L578 Other skin changes due to chronic exposure to nonionizing radiation: Secondary | ICD-10-CM | POA: Diagnosis not present

## 2023-12-09 DIAGNOSIS — L570 Actinic keratosis: Secondary | ICD-10-CM | POA: Diagnosis not present

## 2023-12-09 DIAGNOSIS — Z08 Encounter for follow-up examination after completed treatment for malignant neoplasm: Secondary | ICD-10-CM | POA: Diagnosis not present

## 2023-12-09 DIAGNOSIS — Z85828 Personal history of other malignant neoplasm of skin: Secondary | ICD-10-CM | POA: Diagnosis not present

## 2023-12-19 ENCOUNTER — Other Ambulatory Visit: Payer: Self-pay | Admitting: Family Medicine

## 2023-12-19 DIAGNOSIS — I10 Essential (primary) hypertension: Secondary | ICD-10-CM

## 2023-12-19 DIAGNOSIS — N522 Drug-induced erectile dysfunction: Secondary | ICD-10-CM

## 2023-12-19 DIAGNOSIS — I2510 Atherosclerotic heart disease of native coronary artery without angina pectoris: Secondary | ICD-10-CM

## 2024-01-08 ENCOUNTER — Other Ambulatory Visit: Payer: Self-pay | Admitting: Internal Medicine

## 2024-01-08 DIAGNOSIS — I484 Atypical atrial flutter: Secondary | ICD-10-CM

## 2024-01-08 NOTE — Telephone Encounter (Signed)
 Eliquis  5mg  refill request received. Patient is 83 years old, weight-75.3kg, Crea-1.28 on 09/16/23, Diagnosis-Aflutter, and last seen by Dr. Santo on 06/25/23. Dose is appropriate based on dosing criteria. Will send in refill to requested pharmacy.

## 2024-01-20 DIAGNOSIS — L570 Actinic keratosis: Secondary | ICD-10-CM | POA: Diagnosis not present

## 2024-01-31 ENCOUNTER — Other Ambulatory Visit: Payer: Self-pay | Admitting: Family Medicine

## 2024-01-31 DIAGNOSIS — E782 Mixed hyperlipidemia: Secondary | ICD-10-CM

## 2024-02-02 NOTE — Telephone Encounter (Signed)
 Requested Prescriptions  Pending Prescriptions Disp Refills   ezetimibe  (ZETIA ) 10 MG tablet [Pharmacy Med Name: EZETIMIBE  10 MG TABLET] 90 tablet 2    Sig: TAKE 1 TABLET BY MOUTH EVERY DAY     Cardiovascular:  Antilipid - Sterol Transport Inhibitors Failed - 02/02/2024  1:35 PM      Failed - Lipid Panel in normal range within the last 12 months    Cholesterol, Total  Date Value Ref Range Status  11/15/2019 138 100 - 199 mg/dL Final   Cholesterol  Date Value Ref Range Status  09/16/2023 110 <200 mg/dL Final   LDL Cholesterol (Calc)  Date Value Ref Range Status  09/16/2023 37 mg/dL (calc) Final    Comment:    Reference range: <100 . Desirable range <100 mg/dL for primary prevention;   <70 mg/dL for patients with CHD or diabetic patients  with > or = 2 CHD risk factors. SABRA LDL-C is now calculated using the Martin-Hopkins  calculation, which is a validated novel method providing  better accuracy than the Friedewald equation in the  estimation of LDL-C.  Gladis APPLETHWAITE et al. SANDREA. 7986;689(80): 2061-2068  (http://education.[employer].com/faq/FAQ164)    HDL  Date Value Ref Range Status  09/16/2023 60 > OR = 40 mg/dL Final  91/68/7978 63 >60 mg/dL Final   Triglycerides  Date Value Ref Range Status  09/16/2023 46 <150 mg/dL Final         Passed - AST in normal range and within 360 days    AST  Date Value Ref Range Status  09/16/2023 27 10 - 35 U/L Final         Passed - ALT in normal range and within 360 days    ALT  Date Value Ref Range Status  09/16/2023 26 9 - 46 U/L Final         Passed - Patient is not pregnant      Passed - Valid encounter within last 12 months    Recent Outpatient Visits           3 months ago General medical exam   [place] [facility] Family Medicine Duanne Butler DASEN, MD   1 year ago Chronic systolic CHF (congestive heart failure) [facility])   [place] [facility] Medicine Duanne Butler DASEN, MD   2 years ago Coronary  artery disease involving native coronary artery of native heart without angina pectoris   [facility] Family Medicine [place], Butler DASEN, MD

## 2024-02-21 NOTE — Progress Notes (Unsigned)
  Electrophysiology Office Follow up Visit Note:    Date:  02/22/2024   ID:  [name], DOB 1940/05/19, MRN [id number]  PCP:  Duanne Butler DASEN, MD  [facility] Cardiologist:  Stanly DELENA Leavens, MD  [facility] Electrophysiologist:  OLE DASEN HOLTS, MD    Interval History:     [name] is a 83 y.o. male who presents for a follow up visit.   I last saw the patient June 21, 2021.  He has a history of atrial flutter with 2 prior catheter ablations by Dr. Kelsie.  He also has ectopic atrial tachycardia, coronary artery disease, heart failure, hypertrophic cardiomyopathy, hypertension, hyperlipidemia, CKD 3 and TIA.  He follows with Dr. Leavens.  At the last appointment we discussed treatment options for recurrent atrial flutter and the patient and I mutually decided to pursue a conservative/noninvasive approach to management.  He is doing well today.  No complaints.  No problems with his medications.  He has a primary care physician who monitors his kidney function regularly.       Past medical, surgical, social and family history were reviewed.  ROS:   Please see the history of present illness.    All other systems reviewed and are negative.  EKGs/Labs/Other Studies Reviewed:    The following studies were reviewed today:          Physical Exam:    VS:  BP 130/88 (BP Location: Left Arm, Patient Position: Sitting, Cuff Size: Normal)   Pulse 78   Ht 5' 10 (1.778 m)   Wt 174 lb (78.9 kg)   SpO2 98%   BMI 24.97 kg/m     Wt Readings from Last 3 Encounters:  02/22/24 174 lb (78.9 kg)  11/18/23 166 lb (75.3 kg)  10/13/23 166 lb (75.3 kg)     GEN: no distress CARD: RRR, No MRG RESP: No IWOB. CTAB.      ASSESSMENT:    1. Atypical atrial flutter (HCC)   2. HFrEF (heart failure with reduced ejection fraction) (HCC)   3. Chronic systolic CHF (congestive heart failure) (HCC)    PLAN:    In order of problems listed above:  #Atypical  atrial flutter Asymptomatic Given history of conduction system disease, antiarrhythmic drugs carry risk of advanced conduction block and necessitating permanent pacemaker implant.  We are pursuing a conservative/watchful waiting approach. Continue Eliquis  for stroke prophylaxis  #Chronic systolic heart failure #Hypertrophic cardiomyopathy NYHA class II.  Follows with Dr. Leavens.  Warm and dry on exam today.  #Right bundle branch block #Left anterior fascicular block Asymptomatic  I discussed my upcoming departure from [facility] during today's clinic appointment.  The patient will continue to follow-up with one of my EP partners moving forward.  Follow-up 1 year with EP APP   Signed, Ole Holts, MD, [facility], [facility] 02/22/2024 8:50 AM    Electrophysiology [facility]

## 2024-02-22 ENCOUNTER — Encounter: Payer: Self-pay | Admitting: Cardiology

## 2024-02-22 ENCOUNTER — Ambulatory Visit: Attending: Cardiology | Admitting: Cardiology

## 2024-02-22 VITALS — BP 130/88 | HR 78 | Ht 70.0 in | Wt 174.0 lb

## 2024-02-22 DIAGNOSIS — I484 Atypical atrial flutter: Secondary | ICD-10-CM

## 2024-02-22 DIAGNOSIS — I5022 Chronic systolic (congestive) heart failure: Secondary | ICD-10-CM | POA: Diagnosis not present

## 2024-02-22 DIAGNOSIS — I5020 Unspecified systolic (congestive) heart failure: Secondary | ICD-10-CM | POA: Diagnosis not present

## 2024-02-22 NOTE — Patient Instructions (Signed)

## 2024-02-23 ENCOUNTER — Other Ambulatory Visit: Payer: Self-pay

## 2024-02-23 DIAGNOSIS — I5022 Chronic systolic (congestive) heart failure: Secondary | ICD-10-CM

## 2024-02-23 NOTE — Telephone Encounter (Signed)
 Prescription Request  02/23/2024  LOV: 10/13/23  What is the name of the medication or equipment? empagliflozin  (JARDIANCE ) 25 MG TABS tablet [[id number]]   Have you contacted your pharmacy to request a refill? Yes   Which pharmacy would you like this sent to?  [id number] [place], [place] - [address]  [place] [address] Phone: [phone] Fax: [phone]    Patient notified that their request is being sent to the clinical staff for review and that they should receive a response within 2 business days.   Please advise at [facility] [phone]

## 2024-02-25 ENCOUNTER — Telehealth: Payer: Self-pay

## 2024-02-25 MED ORDER — EMPAGLIFLOZIN 25 MG PO TABS: 25.0000 mg | ORAL_TABLET | Freq: Every day | ORAL | 0 refills | Status: AC

## 2024-02-25 NOTE — Telephone Encounter (Signed)
 Prescription Request  02/25/2024  LOV: 10/13/23  What is the name of the medication or equipment? empagliflozin  (JARDIANCE ) 25 MG TABS tablet [[id number]]   Have you contacted your pharmacy to request a refill? Yes   Which pharmacy would you like this sent to?  [id number] [place], [place] - [address] [place] [address] Phone: [phone] Fax: [phone]    Patient notified that their request is being sent to the clinical staff for review and that they should receive a response within 2 business days.   Please advise at [facility] [phone]

## 2024-02-25 NOTE — Telephone Encounter (Signed)
 Sent in medication

## 2024-02-25 NOTE — Telephone Encounter (Signed)
 Requested Prescriptions  Pending Prescriptions Disp Refills   empagliflozin  (JARDIANCE ) 25 MG TABS tablet 90 tablet 0    Sig: Take 1 tablet (25 mg total) by mouth daily.     Endocrinology:  Diabetes - SGLT2 Inhibitors Failed - 02/25/2024 12:14 PM      Failed - Cr in normal range and within 360 days    Creat  Date Value Ref Range Status  09/16/2023 1.28 (H) 0.70 - 1.22 mg/dL Final         Failed - HBA1C is between 0 and 7.9 and within 180 days    Hgb A1c MFr Bld  Date Value Ref Range Status  12/03/2013 6.1 (H) <5.7 % Final    Comment:    (NOTE)                                                                       According to the ADA Clinical Practice Recommendations for 2011, when HbA1c is used as a screening test:  >=6.5%   Diagnostic of Diabetes Mellitus           (if abnormal result is confirmed) 5.7-6.4%   Increased risk of developing Diabetes Mellitus References:Diagnosis and Classification of Diabetes Mellitus,Diabetes Care,2011,34(Suppl 1):S62-S69 and Standards of Medical Care in         Diabetes - 2011,Diabetes Care,2011,34 (Suppl 1):S11-S61.         Failed - eGFR in normal range and within 360 days    GFR, Est African American  Date Value Ref Range Status  07/03/2020 66 > OR = 60 mL/min/1.31m2 Final   GFR, Est Non African American  Date Value Ref Range Status  07/03/2020 57 (L) > OR = 60 mL/min/1.65m2 Final   GFR  Date Value Ref Range Status  04/25/2013 64.31 >60.00 mL/min Final   eGFR  Date Value Ref Range Status  07/30/2022 62 > OR = 60 mL/min/1.7m2 Final         Passed - Valid encounter within last 6 months    Recent Outpatient Visits           4 months ago General medical exam   [facility] Duanne Butler DASEN, MD   1 year ago Chronic systolic CHF (congestive heart failure) [facility])   [facility] Duanne Butler DASEN, MD   2 years ago Coronary artery disease involving native coronary artery of  native heart without angina pectoris   [place] [facility] Pickard, Butler DASEN, MD

## 2024-03-07 NOTE — Progress Notes (Signed)
 Pharmacy Quality Measure Review  This patient is appearing on a report for being at risk of failing the adherence measure for diabetes medications this calendar year.   Medication: Jardiance  25 mg Last fill date: 02/25/24 for 90 day supply, sold 02/27/24  Insurance report was not up to date. No action needed at this time.   Jenkins Graces, PharmD PGY1 Pharmacy Resident

## 2024-03-31 ENCOUNTER — Other Ambulatory Visit: Payer: Self-pay | Admitting: Family Medicine

## 2024-03-31 DIAGNOSIS — E782 Mixed hyperlipidemia: Secondary | ICD-10-CM

## 2024-03-31 NOTE — Telephone Encounter (Signed)
 Requested Prescriptions  Pending Prescriptions Disp Refills   atorvastatin  (LIPITOR) 40 MG tablet [Pharmacy Med Name: ATORVASTATIN  40 MG TABLET] 90 tablet 1    Sig: TAKE 1 TABLET BY MOUTH EVERY DAY     Cardiovascular:  Antilipid - Statins Failed - 03/31/2024  4:40 PM      Failed - Lipid Panel in normal range within the last 12 months    Cholesterol, Total  Date Value Ref Range Status  11/15/2019 138 100 - 199 mg/dL Final   Cholesterol  Date Value Ref Range Status  09/16/2023 110 <200 mg/dL Final   LDL Cholesterol (Calc)  Date Value Ref Range Status  09/16/2023 37 mg/dL (calc) Final    Comment:    Reference range: <100 . Desirable range <100 mg/dL for primary prevention;   <70 mg/dL for patients with CHD or diabetic patients  with > or = 2 CHD risk factors. SABRA LDL-C is now calculated using the Martin-Hopkins  calculation, which is a validated novel method providing  better accuracy than the Friedewald equation in the  estimation of LDL-C.  Gladis APPLETHWAITE et al. SANDREA. 7986;689(80): 2061-2068  (http://education.[facility].com/faq/FAQ164)    HDL  Date Value Ref Range Status  09/16/2023 60 > OR = 40 mg/dL Final  91/68/7978 63 >60 mg/dL Final   Triglycerides  Date Value Ref Range Status  09/16/2023 46 <150 mg/dL Final         Passed - Patient is not pregnant      Passed - Valid encounter within last 12 months    Recent Outpatient Visits           5 months ago General medical exam   [place] [facility] Duanne Butler DASEN, MD   1 year ago Chronic systolic CHF (congestive heart failure) [facility])   [facility] Duanne Butler DASEN, MD   2 years ago Coronary artery disease involving native coronary artery of native heart without angina pectoris   [facility] Pickard, Butler DASEN, MD

## 2024-04-20 ENCOUNTER — Other Ambulatory Visit: Payer: Self-pay | Admitting: Internal Medicine

## 2024-04-20 DIAGNOSIS — I10 Essential (primary) hypertension: Secondary | ICD-10-CM

## 2024-04-20 DIAGNOSIS — I5022 Chronic systolic (congestive) heart failure: Secondary | ICD-10-CM

## 2024-04-20 DIAGNOSIS — Z8673 Personal history of transient ischemic attack (TIA), and cerebral infarction without residual deficits: Secondary | ICD-10-CM

## 2024-11-23 ENCOUNTER — Ambulatory Visit
